# Patient Record
Sex: Female | Born: 1950
Health system: Southern US, Community
[De-identification: ages and names within clinical notes are randomized; demographics above are authoritative.]

## PROBLEM LIST (undated history)

## (undated) ENCOUNTER — Emergency Department (HOSPITAL_COMMUNITY): Payer: Medicare HMO

## (undated) DIAGNOSIS — R002 Palpitations: Secondary | ICD-10-CM

## (undated) DIAGNOSIS — Z860100 Personal history of colon polyps, unspecified: Secondary | ICD-10-CM

## (undated) DIAGNOSIS — R011 Cardiac murmur, unspecified: Secondary | ICD-10-CM

## (undated) DIAGNOSIS — I422 Other hypertrophic cardiomyopathy: Secondary | ICD-10-CM

## (undated) DIAGNOSIS — K219 Gastro-esophageal reflux disease without esophagitis: Secondary | ICD-10-CM

## (undated) DIAGNOSIS — M1990 Unspecified osteoarthritis, unspecified site: Secondary | ICD-10-CM

## (undated) DIAGNOSIS — J309 Allergic rhinitis, unspecified: Secondary | ICD-10-CM

## (undated) DIAGNOSIS — F32A Depression, unspecified: Secondary | ICD-10-CM

## (undated) DIAGNOSIS — H269 Unspecified cataract: Secondary | ICD-10-CM

## (undated) DIAGNOSIS — G4730 Sleep apnea, unspecified: Secondary | ICD-10-CM

## (undated) DIAGNOSIS — G43109 Migraine with aura, not intractable, without status migrainosus: Secondary | ICD-10-CM

## (undated) DIAGNOSIS — I10 Essential (primary) hypertension: Secondary | ICD-10-CM

## (undated) DIAGNOSIS — Z83719 Family history of colon polyps, unspecified: Secondary | ICD-10-CM

## (undated) DIAGNOSIS — M797 Fibromyalgia: Secondary | ICD-10-CM

## (undated) DIAGNOSIS — F419 Anxiety disorder, unspecified: Secondary | ICD-10-CM

## (undated) DIAGNOSIS — E7800 Pure hypercholesterolemia, unspecified: Secondary | ICD-10-CM

## (undated) DIAGNOSIS — Z8371 Family history of colonic polyps: Secondary | ICD-10-CM

## (undated) DIAGNOSIS — I341 Nonrheumatic mitral (valve) prolapse: Secondary | ICD-10-CM

## (undated) HISTORY — DX: Unspecified cataract: H26.9

## (undated) HISTORY — DX: Pure hypercholesterolemia, unspecified: E78.00

## (undated) HISTORY — DX: Other hypertrophic cardiomyopathy: I42.2

## (undated) HISTORY — DX: Unspecified osteoarthritis, unspecified site: M19.90

## (undated) HISTORY — DX: Allergic rhinitis, unspecified: J30.9

## (undated) HISTORY — DX: Palpitations: R00.2

## (undated) HISTORY — PX: ABDOMINAL HYSTERECTOMY: SHX81

## (undated) HISTORY — DX: Family history of colonic polyps: Z83.71

## (undated) HISTORY — DX: Personal history of colon polyps, unspecified: Z86.0100

## (undated) HISTORY — DX: Personal history of colonic polyps: Z86.010

## (undated) HISTORY — DX: Depression, unspecified: F32.A

## (undated) HISTORY — DX: Sleep apnea, unspecified: G47.30

## (undated) HISTORY — DX: Migraine with aura, not intractable, without status migrainosus: G43.109

## (undated) HISTORY — DX: Gastro-esophageal reflux disease without esophagitis: K21.9

## (undated) HISTORY — DX: Family history of colon polyps, unspecified: Z83.719

## (undated) HISTORY — DX: Fibromyalgia: M79.7

## (undated) HISTORY — DX: Nonrheumatic mitral (valve) prolapse: I34.1

## (undated) HISTORY — DX: Anxiety disorder, unspecified: F41.9

## (undated) HISTORY — PX: EYE SURGERY: SHX253

---

## 1979-11-18 HISTORY — PX: OTHER SURGICAL HISTORY: SHX169

## 2010-02-05 ENCOUNTER — Encounter: Payer: Self-pay | Admitting: Cardiology

## 2010-02-21 ENCOUNTER — Encounter: Payer: Self-pay | Admitting: Cardiology

## 2010-02-21 ENCOUNTER — Ambulatory Visit: Payer: Self-pay

## 2010-02-21 ENCOUNTER — Ambulatory Visit: Payer: Self-pay | Admitting: Cardiology

## 2010-02-21 ENCOUNTER — Ambulatory Visit (HOSPITAL_COMMUNITY): Admission: RE | Admit: 2010-02-21 | Discharge: 2010-02-21 | Payer: Self-pay | Admitting: Family Medicine

## 2010-03-11 ENCOUNTER — Telehealth (INDEPENDENT_AMBULATORY_CARE_PROVIDER_SITE_OTHER): Payer: Self-pay | Admitting: *Deleted

## 2010-03-21 ENCOUNTER — Ambulatory Visit: Payer: Self-pay | Admitting: Cardiovascular Disease

## 2010-12-17 NOTE — Letter (Signed)
Summary: [facility] History and Physical   [facility] History and Physical   Imported By: Roderic Ovens 09/18/2010 16:03:10  _____________________________________________________________________  External Attachment:    Type:   Image     Comment:   External Document

## 2010-12-17 NOTE — Progress Notes (Signed)
  Stress Echo faxed over to Leslie Griffin/[facility] [phone] [facility]  March 11, 2010 10:15 AM

## 2011-03-01 ENCOUNTER — Encounter: Payer: Self-pay | Admitting: Cardiovascular Disease

## 2011-04-01 NOTE — Assessment & Plan Note (Signed)
[facility]                        [place] CARDI[facility] OFFICE NOTE   CLAIR, BARDWELL                         MRN:          [id number]  DATE:03/21/2010                            DOB:          10/13/1951    CHIEF COMPLAINT:  Palpitations.   HISTORY OF PRESENT ILLNESS:  Ms. Inga is a 60 year old white female  with past medical history significant for fibromyalgia, anxiety, long-  term history of palpitations, who is presenting for evaluation of the  palpitations.  The patient states that she is experiencing palpitations  for approximately 7 years on and off.  Up until several months ago, she  was experiencing very long episodes of palpitations during the day at  work.  It has since resolved.  Now, she states she feels an extra  heartbeat several times a day.  There are no associated symptoms.  She  also has intermittent episodes of chest discomfort that are infrequent  and self-limiting.  The patient initially wore a 24-hour Holter monitor  in January of this year that showed no significant arrhythmias.  She  then wore a cardiac event monitor for approximately 3-4 weeks, which  again showed no significant arrhythmia.  There were occasional episodes  of sinus tachycardia that did not correlate with any of the symptoms  that she reported.  Lastly, she recently underwent a stress  echocardiogram that was negative for inducible ischemia.  The patient  works full-time as a Theatre stage manager at [employer] and is able to complete her  job without any difficulties.  She has stopped exercising for fear that  she may have a heart condition that would make exercising dangerous.   PAST MEDICAL HISTORY:  As above in the HPI.   SOCIAL HISTORY:  No tobacco.  Rare alcohol.   FAMILY HISTORY:  Positive for premature coronary artery disease.   ALLERGIES:  PENICILLIN, SULFA, BENADRYL, KEFLEX.   MEDICATIONS:  1. Aspirin 81 mg daily.  2. Multivitamin.   REVIEW OF SYSTEMS:   Positive for anxiety, positive for some psychosocial  stressors as her husband is on disability for retinitis pigmentosa.  Other systems as in HPI, otherwise negative.   PHYSICAL EXAMINATION:  VITAL SIGNS:  Blood pressure is 136/80, pulse is  67, saturating 98% on room air.  She weighs 174 pounds.  GENERAL:  No acute distress.  HEENT:  Normocephalic, atraumatic.  NECK:  Supple.  There is no JVD.  There is no carotid bruits.  HEART:  Regular rate and rhythm without murmur, rub, or gallop.  LUNGS:  Clear bilaterally.  ABDOMEN:  Soft, nontender, nondistended.  EXTREMITIES:  Without edema.  SKIN:  Warm and dry.  MUSCULOSKELETAL:  5/5 bilateral upper and lower extremity strength.   Review of the patient's cardiac event monitor reports as well as the  stress echo above in HPI.  The patient has also had a transthoracic  echocardiogram that showed normal ejection fraction and mild mitral  regurgitation.  EKG taken today in clinic independently interpreted by  myself demonstrates normal sinus rhythm without any significant ST or T-  wave abnormalities.   ASSESSMENT:  A 60 year old female presenting with palpitations that are  benign in nature.  The patient has worn a prolonged cardiac event  monitor that was unrevealing.  Her symptoms since that time have  improved.  She has also had a recent negative stress echo.  I think it  is likely that many of the patient's symptoms are related to anxiety.   PLAN:  At this point, I recommend that the patient resume regular  exercise program which she is very interested in doing as this helps her  to relieve some stress.  She is encouraged to follow a well-balanced  diet.  She is reminded that she is at risk for developing significant  coronary disease in the future as she does have a family history of  premature coronary disease.  We will see her back in clinic in 6 months'  time and she will contact our office sooner if any problems arise.      Brayton El, MD  Electronically Signed    SGA/MedQ  DD: 03/21/2010  DT: 03/21/2010  Job #: [id number]

## 2011-04-01 NOTE — Letter (Signed)
Mar 21, 2010    Charlott Rakes, MD.  [address]  [place], [place] [address]   RE:  Leslie, Griffin  MRN:  [id number]  /  DOB:  05-24-1951   Dear Dr. Yetta Flock,   I had the pleasure of seeing Leslie Griffin in clinic this morning.  As you  know, she is a 60 year old female, who has been having issues with  palpitations for quite some time.  She states that over the past month  or two the palpitations have significantly improved and that now she is  only feeling a single extra heart beat at a time, several times a day.  I have reviewed her recent medical record and it appears she has had a  negative stress echocardiogram.  She also wore a 24-hour Holter monitor  followed up by a 3-4 week cardiac event monitor.  Both of these monitors  showed no sign of significant arrhythmia.  There was some sinus  tachycardia on the 30 day event monitor, but it did not correlate with  any of the symptoms the patient experienced.  She does endorse  significant anxiety and stress.   I have encouraged the patient to resume her regular exercise program,  which she is excited to do.  She is reminded that she is at risk for  developing significant coronary disease in the future as she does have a  family history of premature coronary disease.  Therefore, her blood  pressure and her cholesterol should be followed closely.  I see no need  for any further workup at this time and the patient is very happy with  this plan.  Thank you for the referral of this patient and please  contact my office in the future if I can be of further assistance.    Sincerely,      Brayton El, MD  Electronically Signed    SGA/MedQ  DD: 03/21/2010  DT: 03/21/2010  Job #: [id number]

## 2011-06-04 ENCOUNTER — Encounter: Payer: Self-pay | Admitting: Cardiovascular Disease

## 2016-11-18 DIAGNOSIS — G43109 Migraine with aura, not intractable, without status migrainosus: Secondary | ICD-10-CM | POA: Diagnosis not present

## 2016-11-18 DIAGNOSIS — Z6832 Body mass index (BMI) 32.0-32.9, adult: Secondary | ICD-10-CM | POA: Diagnosis not present

## 2016-12-02 DIAGNOSIS — E538 Deficiency of other specified B group vitamins: Secondary | ICD-10-CM | POA: Diagnosis not present

## 2016-12-11 DIAGNOSIS — M797 Fibromyalgia: Secondary | ICD-10-CM | POA: Diagnosis not present

## 2016-12-11 DIAGNOSIS — E538 Deficiency of other specified B group vitamins: Secondary | ICD-10-CM | POA: Diagnosis not present

## 2016-12-19 DIAGNOSIS — E538 Deficiency of other specified B group vitamins: Secondary | ICD-10-CM | POA: Diagnosis not present

## 2016-12-30 DIAGNOSIS — E538 Deficiency of other specified B group vitamins: Secondary | ICD-10-CM | POA: Diagnosis not present

## 2017-01-02 DIAGNOSIS — R0789 Other chest pain: Secondary | ICD-10-CM | POA: Diagnosis not present

## 2017-01-06 DIAGNOSIS — E538 Deficiency of other specified B group vitamins: Secondary | ICD-10-CM | POA: Diagnosis not present

## 2017-01-15 DIAGNOSIS — G4700 Insomnia, unspecified: Secondary | ICD-10-CM | POA: Diagnosis not present

## 2017-01-15 DIAGNOSIS — E538 Deficiency of other specified B group vitamins: Secondary | ICD-10-CM | POA: Diagnosis not present

## 2017-01-15 DIAGNOSIS — I10 Essential (primary) hypertension: Secondary | ICD-10-CM | POA: Diagnosis not present

## 2017-01-15 DIAGNOSIS — R0789 Other chest pain: Secondary | ICD-10-CM | POA: Diagnosis not present

## 2017-01-22 DIAGNOSIS — Z6832 Body mass index (BMI) 32.0-32.9, adult: Secondary | ICD-10-CM | POA: Diagnosis not present

## 2017-01-22 DIAGNOSIS — E538 Deficiency of other specified B group vitamins: Secondary | ICD-10-CM | POA: Diagnosis not present

## 2017-01-22 DIAGNOSIS — I10 Essential (primary) hypertension: Secondary | ICD-10-CM | POA: Diagnosis not present

## 2017-01-22 DIAGNOSIS — E782 Mixed hyperlipidemia: Secondary | ICD-10-CM | POA: Diagnosis not present

## 2017-02-05 DIAGNOSIS — E538 Deficiency of other specified B group vitamins: Secondary | ICD-10-CM | POA: Diagnosis not present

## 2017-03-04 DIAGNOSIS — H43813 Vitreous degeneration, bilateral: Secondary | ICD-10-CM | POA: Diagnosis not present

## 2017-03-10 DIAGNOSIS — E538 Deficiency of other specified B group vitamins: Secondary | ICD-10-CM | POA: Diagnosis not present

## 2017-03-12 DIAGNOSIS — H4423 Degenerative myopia, bilateral: Secondary | ICD-10-CM | POA: Diagnosis not present

## 2017-04-16 DIAGNOSIS — H4423 Degenerative myopia, bilateral: Secondary | ICD-10-CM | POA: Diagnosis not present

## 2017-04-16 DIAGNOSIS — H43813 Vitreous degeneration, bilateral: Secondary | ICD-10-CM | POA: Diagnosis not present

## 2017-04-22 DIAGNOSIS — E782 Mixed hyperlipidemia: Secondary | ICD-10-CM | POA: Diagnosis not present

## 2017-04-22 DIAGNOSIS — I10 Essential (primary) hypertension: Secondary | ICD-10-CM | POA: Diagnosis not present

## 2017-04-30 DIAGNOSIS — F339 Major depressive disorder, recurrent, unspecified: Secondary | ICD-10-CM | POA: Diagnosis not present

## 2017-04-30 DIAGNOSIS — R69 Illness, unspecified: Secondary | ICD-10-CM | POA: Diagnosis not present

## 2017-04-30 DIAGNOSIS — E663 Overweight: Secondary | ICD-10-CM | POA: Diagnosis not present

## 2017-04-30 DIAGNOSIS — M797 Fibromyalgia: Secondary | ICD-10-CM | POA: Diagnosis not present

## 2017-04-30 DIAGNOSIS — I249 Acute ischemic heart disease, unspecified: Secondary | ICD-10-CM | POA: Diagnosis not present

## 2017-04-30 DIAGNOSIS — E782 Mixed hyperlipidemia: Secondary | ICD-10-CM | POA: Diagnosis not present

## 2017-04-30 DIAGNOSIS — R51 Headache: Secondary | ICD-10-CM | POA: Diagnosis not present

## 2017-04-30 DIAGNOSIS — G43909 Migraine, unspecified, not intractable, without status migrainosus: Secondary | ICD-10-CM | POA: Diagnosis not present

## 2017-04-30 DIAGNOSIS — Z1389 Encounter for screening for other disorder: Secondary | ICD-10-CM | POA: Diagnosis not present

## 2017-04-30 DIAGNOSIS — I10 Essential (primary) hypertension: Secondary | ICD-10-CM | POA: Diagnosis not present

## 2017-04-30 DIAGNOSIS — Z139 Encounter for screening, unspecified: Secondary | ICD-10-CM | POA: Diagnosis not present

## 2017-04-30 DIAGNOSIS — R0602 Shortness of breath: Secondary | ICD-10-CM | POA: Diagnosis not present

## 2017-05-15 DIAGNOSIS — I081 Rheumatic disorders of both mitral and tricuspid valves: Secondary | ICD-10-CM | POA: Diagnosis not present

## 2017-05-15 DIAGNOSIS — I517 Cardiomegaly: Secondary | ICD-10-CM | POA: Diagnosis not present

## 2017-05-15 DIAGNOSIS — I519 Heart disease, unspecified: Secondary | ICD-10-CM | POA: Diagnosis not present

## 2017-05-15 DIAGNOSIS — I350 Nonrheumatic aortic (valve) stenosis: Secondary | ICD-10-CM | POA: Diagnosis not present

## 2017-05-18 DIAGNOSIS — H26491 Other secondary cataract, right eye: Secondary | ICD-10-CM | POA: Diagnosis not present

## 2017-06-02 DIAGNOSIS — H26491 Other secondary cataract, right eye: Secondary | ICD-10-CM | POA: Diagnosis not present

## 2017-06-08 DIAGNOSIS — R69 Illness, unspecified: Secondary | ICD-10-CM | POA: Diagnosis not present

## 2017-06-08 DIAGNOSIS — I519 Heart disease, unspecified: Secondary | ICD-10-CM | POA: Diagnosis not present

## 2017-06-18 DIAGNOSIS — H43813 Vitreous degeneration, bilateral: Secondary | ICD-10-CM | POA: Diagnosis not present

## 2017-06-25 DIAGNOSIS — R079 Chest pain, unspecified: Secondary | ICD-10-CM | POA: Diagnosis not present

## 2017-06-25 DIAGNOSIS — Z8249 Family history of ischemic heart disease and other diseases of the circulatory system: Secondary | ICD-10-CM | POA: Diagnosis not present

## 2017-06-25 DIAGNOSIS — R011 Cardiac murmur, unspecified: Secondary | ICD-10-CM | POA: Diagnosis not present

## 2017-06-25 DIAGNOSIS — Z6834 Body mass index (BMI) 34.0-34.9, adult: Secondary | ICD-10-CM | POA: Diagnosis not present

## 2017-07-06 DIAGNOSIS — E782 Mixed hyperlipidemia: Secondary | ICD-10-CM | POA: Diagnosis not present

## 2017-07-06 DIAGNOSIS — I10 Essential (primary) hypertension: Secondary | ICD-10-CM | POA: Diagnosis not present

## 2017-07-06 DIAGNOSIS — E538 Deficiency of other specified B group vitamins: Secondary | ICD-10-CM | POA: Diagnosis not present

## 2017-07-07 DIAGNOSIS — Z1211 Encounter for screening for malignant neoplasm of colon: Secondary | ICD-10-CM | POA: Diagnosis not present

## 2017-07-07 DIAGNOSIS — Z8371 Family history of colonic polyps: Secondary | ICD-10-CM | POA: Diagnosis not present

## 2017-07-10 DIAGNOSIS — E782 Mixed hyperlipidemia: Secondary | ICD-10-CM | POA: Diagnosis not present

## 2017-07-10 DIAGNOSIS — D649 Anemia, unspecified: Secondary | ICD-10-CM | POA: Diagnosis not present

## 2017-07-10 DIAGNOSIS — Z6834 Body mass index (BMI) 34.0-34.9, adult: Secondary | ICD-10-CM | POA: Diagnosis not present

## 2017-07-10 DIAGNOSIS — E538 Deficiency of other specified B group vitamins: Secondary | ICD-10-CM | POA: Diagnosis not present

## 2017-07-10 DIAGNOSIS — I10 Essential (primary) hypertension: Secondary | ICD-10-CM | POA: Diagnosis not present

## 2017-07-10 DIAGNOSIS — R61 Generalized hyperhidrosis: Secondary | ICD-10-CM | POA: Diagnosis not present

## 2017-07-15 DIAGNOSIS — I208 Other forms of angina pectoris: Secondary | ICD-10-CM | POA: Diagnosis not present

## 2017-07-15 DIAGNOSIS — R5383 Other fatigue: Secondary | ICD-10-CM | POA: Diagnosis not present

## 2017-07-15 DIAGNOSIS — R600 Localized edema: Secondary | ICD-10-CM | POA: Diagnosis not present

## 2017-07-15 DIAGNOSIS — R0609 Other forms of dyspnea: Secondary | ICD-10-CM | POA: Diagnosis not present

## 2017-07-16 DIAGNOSIS — R400 Somnolence: Secondary | ICD-10-CM | POA: Diagnosis not present

## 2017-07-16 DIAGNOSIS — R5383 Other fatigue: Secondary | ICD-10-CM | POA: Diagnosis not present

## 2017-07-16 DIAGNOSIS — R0902 Hypoxemia: Secondary | ICD-10-CM | POA: Diagnosis not present

## 2017-07-16 DIAGNOSIS — G4733 Obstructive sleep apnea (adult) (pediatric): Secondary | ICD-10-CM | POA: Diagnosis not present

## 2017-07-17 DIAGNOSIS — I2510 Atherosclerotic heart disease of native coronary artery without angina pectoris: Secondary | ICD-10-CM | POA: Diagnosis not present

## 2017-07-17 DIAGNOSIS — R9431 Abnormal electrocardiogram [ECG] [EKG]: Secondary | ICD-10-CM | POA: Diagnosis not present

## 2017-07-17 DIAGNOSIS — I208 Other forms of angina pectoris: Secondary | ICD-10-CM | POA: Diagnosis not present

## 2017-07-17 DIAGNOSIS — R5383 Other fatigue: Secondary | ICD-10-CM | POA: Diagnosis not present

## 2017-07-17 DIAGNOSIS — I10 Essential (primary) hypertension: Secondary | ICD-10-CM | POA: Diagnosis not present

## 2017-07-17 DIAGNOSIS — I429 Cardiomyopathy, unspecified: Secondary | ICD-10-CM | POA: Diagnosis not present

## 2017-07-17 DIAGNOSIS — Z8249 Family history of ischemic heart disease and other diseases of the circulatory system: Secondary | ICD-10-CM | POA: Diagnosis not present

## 2017-07-17 DIAGNOSIS — E785 Hyperlipidemia, unspecified: Secondary | ICD-10-CM | POA: Diagnosis not present

## 2017-07-17 DIAGNOSIS — R0609 Other forms of dyspnea: Secondary | ICD-10-CM | POA: Diagnosis not present

## 2017-07-17 DIAGNOSIS — R600 Localized edema: Secondary | ICD-10-CM | POA: Diagnosis not present

## 2017-07-29 DIAGNOSIS — R5383 Other fatigue: Secondary | ICD-10-CM | POA: Diagnosis not present

## 2017-07-29 DIAGNOSIS — R0609 Other forms of dyspnea: Secondary | ICD-10-CM | POA: Diagnosis not present

## 2017-07-29 DIAGNOSIS — R600 Localized edema: Secondary | ICD-10-CM | POA: Diagnosis not present

## 2017-07-29 DIAGNOSIS — I208 Other forms of angina pectoris: Secondary | ICD-10-CM | POA: Diagnosis not present

## 2017-08-03 DIAGNOSIS — R931 Abnormal findings on diagnostic imaging of heart and coronary circulation: Secondary | ICD-10-CM | POA: Diagnosis not present

## 2017-08-03 DIAGNOSIS — R600 Localized edema: Secondary | ICD-10-CM | POA: Diagnosis not present

## 2017-08-03 DIAGNOSIS — R5383 Other fatigue: Secondary | ICD-10-CM | POA: Diagnosis not present

## 2017-08-03 DIAGNOSIS — E785 Hyperlipidemia, unspecified: Secondary | ICD-10-CM | POA: Diagnosis not present

## 2017-08-03 DIAGNOSIS — I2510 Atherosclerotic heart disease of native coronary artery without angina pectoris: Secondary | ICD-10-CM | POA: Diagnosis not present

## 2017-08-03 DIAGNOSIS — I10 Essential (primary) hypertension: Secondary | ICD-10-CM | POA: Diagnosis not present

## 2017-08-03 DIAGNOSIS — R0609 Other forms of dyspnea: Secondary | ICD-10-CM | POA: Diagnosis not present

## 2017-08-03 DIAGNOSIS — R9431 Abnormal electrocardiogram [ECG] [EKG]: Secondary | ICD-10-CM | POA: Diagnosis not present

## 2017-08-03 DIAGNOSIS — I208 Other forms of angina pectoris: Secondary | ICD-10-CM | POA: Diagnosis not present

## 2017-08-03 DIAGNOSIS — I519 Heart disease, unspecified: Secondary | ICD-10-CM | POA: Diagnosis not present

## 2017-08-04 DIAGNOSIS — R0609 Other forms of dyspnea: Secondary | ICD-10-CM | POA: Diagnosis not present

## 2017-08-04 DIAGNOSIS — E785 Hyperlipidemia, unspecified: Secondary | ICD-10-CM | POA: Diagnosis not present

## 2017-08-04 DIAGNOSIS — I208 Other forms of angina pectoris: Secondary | ICD-10-CM | POA: Diagnosis not present

## 2017-08-04 DIAGNOSIS — R9431 Abnormal electrocardiogram [ECG] [EKG]: Secondary | ICD-10-CM | POA: Diagnosis not present

## 2017-08-04 DIAGNOSIS — I519 Heart disease, unspecified: Secondary | ICD-10-CM | POA: Diagnosis not present

## 2017-08-04 DIAGNOSIS — R600 Localized edema: Secondary | ICD-10-CM | POA: Diagnosis not present

## 2017-08-04 DIAGNOSIS — I2510 Atherosclerotic heart disease of native coronary artery without angina pectoris: Secondary | ICD-10-CM | POA: Diagnosis not present

## 2017-08-04 DIAGNOSIS — I10 Essential (primary) hypertension: Secondary | ICD-10-CM | POA: Diagnosis not present

## 2017-08-04 DIAGNOSIS — R5383 Other fatigue: Secondary | ICD-10-CM | POA: Diagnosis not present

## 2017-08-04 DIAGNOSIS — R931 Abnormal findings on diagnostic imaging of heart and coronary circulation: Secondary | ICD-10-CM | POA: Diagnosis not present

## 2017-08-05 DIAGNOSIS — R9431 Abnormal electrocardiogram [ECG] [EKG]: Secondary | ICD-10-CM | POA: Diagnosis not present

## 2017-08-05 DIAGNOSIS — R931 Abnormal findings on diagnostic imaging of heart and coronary circulation: Secondary | ICD-10-CM | POA: Diagnosis not present

## 2017-08-05 DIAGNOSIS — I10 Essential (primary) hypertension: Secondary | ICD-10-CM | POA: Diagnosis not present

## 2017-08-05 DIAGNOSIS — R0609 Other forms of dyspnea: Secondary | ICD-10-CM | POA: Diagnosis not present

## 2017-08-05 DIAGNOSIS — E785 Hyperlipidemia, unspecified: Secondary | ICD-10-CM | POA: Diagnosis not present

## 2017-08-05 DIAGNOSIS — I519 Heart disease, unspecified: Secondary | ICD-10-CM | POA: Diagnosis not present

## 2017-08-05 DIAGNOSIS — I208 Other forms of angina pectoris: Secondary | ICD-10-CM | POA: Diagnosis not present

## 2017-08-05 DIAGNOSIS — Z8249 Family history of ischemic heart disease and other diseases of the circulatory system: Secondary | ICD-10-CM | POA: Diagnosis not present

## 2017-08-05 DIAGNOSIS — R5383 Other fatigue: Secondary | ICD-10-CM | POA: Diagnosis not present

## 2017-08-09 DIAGNOSIS — S80862A Insect bite (nonvenomous), left lower leg, initial encounter: Secondary | ICD-10-CM | POA: Diagnosis not present

## 2017-08-09 DIAGNOSIS — A932 Colorado tick fever: Secondary | ICD-10-CM | POA: Diagnosis not present

## 2017-08-10 DIAGNOSIS — R69 Illness, unspecified: Secondary | ICD-10-CM | POA: Diagnosis not present

## 2017-08-12 DIAGNOSIS — S30860A Insect bite (nonvenomous) of lower back and pelvis, initial encounter: Secondary | ICD-10-CM | POA: Diagnosis not present

## 2017-08-28 DIAGNOSIS — R5383 Other fatigue: Secondary | ICD-10-CM | POA: Diagnosis not present

## 2017-08-28 DIAGNOSIS — Z23 Encounter for immunization: Secondary | ICD-10-CM | POA: Diagnosis not present

## 2017-08-28 DIAGNOSIS — Z139 Encounter for screening, unspecified: Secondary | ICD-10-CM | POA: Diagnosis not present

## 2017-08-28 DIAGNOSIS — G4733 Obstructive sleep apnea (adult) (pediatric): Secondary | ICD-10-CM | POA: Diagnosis not present

## 2017-10-05 DIAGNOSIS — Z1211 Encounter for screening for malignant neoplasm of colon: Secondary | ICD-10-CM | POA: Diagnosis not present

## 2017-10-05 DIAGNOSIS — G43909 Migraine, unspecified, not intractable, without status migrainosus: Secondary | ICD-10-CM | POA: Diagnosis not present

## 2017-10-05 DIAGNOSIS — M797 Fibromyalgia: Secondary | ICD-10-CM | POA: Diagnosis not present

## 2017-10-05 DIAGNOSIS — K5730 Diverticulosis of large intestine without perforation or abscess without bleeding: Secondary | ICD-10-CM | POA: Diagnosis not present

## 2017-10-05 DIAGNOSIS — E7800 Pure hypercholesterolemia, unspecified: Secondary | ICD-10-CM | POA: Diagnosis not present

## 2017-10-05 DIAGNOSIS — M1990 Unspecified osteoarthritis, unspecified site: Secondary | ICD-10-CM | POA: Diagnosis not present

## 2017-10-05 DIAGNOSIS — Z79899 Other long term (current) drug therapy: Secondary | ICD-10-CM | POA: Diagnosis not present

## 2017-10-05 DIAGNOSIS — K648 Other hemorrhoids: Secondary | ICD-10-CM | POA: Diagnosis not present

## 2017-10-05 DIAGNOSIS — Z8371 Family history of colonic polyps: Secondary | ICD-10-CM | POA: Diagnosis not present

## 2017-10-05 HISTORY — PX: COLONOSCOPY: SHX174

## 2017-10-21 DIAGNOSIS — Z0100 Encounter for examination of eyes and vision without abnormal findings: Secondary | ICD-10-CM | POA: Diagnosis not present

## 2017-10-21 DIAGNOSIS — H5210 Myopia, unspecified eye: Secondary | ICD-10-CM | POA: Diagnosis not present

## 2017-10-21 DIAGNOSIS — H18413 Arcus senilis, bilateral: Secondary | ICD-10-CM | POA: Diagnosis not present

## 2017-10-29 DIAGNOSIS — R69 Illness, unspecified: Secondary | ICD-10-CM | POA: Diagnosis not present

## 2017-11-04 DIAGNOSIS — R69 Illness, unspecified: Secondary | ICD-10-CM | POA: Diagnosis not present

## 2017-11-04 DIAGNOSIS — G4700 Insomnia, unspecified: Secondary | ICD-10-CM | POA: Insufficient documentation

## 2017-11-04 DIAGNOSIS — M797 Fibromyalgia: Secondary | ICD-10-CM | POA: Diagnosis not present

## 2017-11-04 DIAGNOSIS — M2550 Pain in unspecified joint: Secondary | ICD-10-CM | POA: Insufficient documentation

## 2017-11-04 DIAGNOSIS — Z9151 Personal history of suicidal behavior: Secondary | ICD-10-CM | POA: Insufficient documentation

## 2017-11-04 DIAGNOSIS — F331 Major depressive disorder, recurrent, moderate: Secondary | ICD-10-CM | POA: Insufficient documentation

## 2017-11-04 DIAGNOSIS — F419 Anxiety disorder, unspecified: Secondary | ICD-10-CM | POA: Insufficient documentation

## 2017-11-19 DIAGNOSIS — R69 Illness, unspecified: Secondary | ICD-10-CM | POA: Diagnosis not present

## 2017-12-03 DIAGNOSIS — M797 Fibromyalgia: Secondary | ICD-10-CM | POA: Diagnosis not present

## 2017-12-03 DIAGNOSIS — R5383 Other fatigue: Secondary | ICD-10-CM | POA: Diagnosis not present

## 2017-12-03 DIAGNOSIS — M545 Low back pain: Secondary | ICD-10-CM | POA: Diagnosis not present

## 2017-12-04 DIAGNOSIS — R5383 Other fatigue: Secondary | ICD-10-CM | POA: Insufficient documentation

## 2017-12-04 DIAGNOSIS — M545 Low back pain, unspecified: Secondary | ICD-10-CM | POA: Insufficient documentation

## 2018-01-27 DIAGNOSIS — M25571 Pain in right ankle and joints of right foot: Secondary | ICD-10-CM | POA: Diagnosis not present

## 2018-01-27 DIAGNOSIS — M25471 Effusion, right ankle: Secondary | ICD-10-CM | POA: Insufficient documentation

## 2018-02-03 DIAGNOSIS — R5383 Other fatigue: Secondary | ICD-10-CM | POA: Diagnosis not present

## 2018-02-03 DIAGNOSIS — I429 Cardiomyopathy, unspecified: Secondary | ICD-10-CM | POA: Diagnosis not present

## 2018-02-03 DIAGNOSIS — M797 Fibromyalgia: Secondary | ICD-10-CM | POA: Diagnosis not present

## 2018-02-03 DIAGNOSIS — R0609 Other forms of dyspnea: Secondary | ICD-10-CM | POA: Diagnosis not present

## 2018-02-03 DIAGNOSIS — I519 Heart disease, unspecified: Secondary | ICD-10-CM | POA: Diagnosis not present

## 2018-02-03 DIAGNOSIS — R931 Abnormal findings on diagnostic imaging of heart and coronary circulation: Secondary | ICD-10-CM | POA: Diagnosis not present

## 2018-02-03 DIAGNOSIS — I10 Essential (primary) hypertension: Secondary | ICD-10-CM | POA: Diagnosis not present

## 2018-02-03 DIAGNOSIS — E785 Hyperlipidemia, unspecified: Secondary | ICD-10-CM | POA: Diagnosis not present

## 2018-02-03 DIAGNOSIS — I208 Other forms of angina pectoris: Secondary | ICD-10-CM | POA: Diagnosis not present

## 2018-02-03 DIAGNOSIS — R9431 Abnormal electrocardiogram [ECG] [EKG]: Secondary | ICD-10-CM | POA: Diagnosis not present

## 2018-03-22 DIAGNOSIS — M797 Fibromyalgia: Secondary | ICD-10-CM | POA: Diagnosis not present

## 2018-04-21 DIAGNOSIS — I10 Essential (primary) hypertension: Secondary | ICD-10-CM | POA: Diagnosis not present

## 2018-04-21 DIAGNOSIS — R931 Abnormal findings on diagnostic imaging of heart and coronary circulation: Secondary | ICD-10-CM | POA: Diagnosis not present

## 2018-04-21 DIAGNOSIS — R5383 Other fatigue: Secondary | ICD-10-CM | POA: Diagnosis not present

## 2018-04-21 DIAGNOSIS — I208 Other forms of angina pectoris: Secondary | ICD-10-CM | POA: Diagnosis not present

## 2018-04-21 DIAGNOSIS — R0609 Other forms of dyspnea: Secondary | ICD-10-CM | POA: Diagnosis not present

## 2018-04-21 DIAGNOSIS — E785 Hyperlipidemia, unspecified: Secondary | ICD-10-CM | POA: Diagnosis not present

## 2018-04-21 DIAGNOSIS — R9431 Abnormal electrocardiogram [ECG] [EKG]: Secondary | ICD-10-CM | POA: Diagnosis not present

## 2018-04-21 DIAGNOSIS — I519 Heart disease, unspecified: Secondary | ICD-10-CM | POA: Diagnosis not present

## 2018-05-05 DIAGNOSIS — I519 Heart disease, unspecified: Secondary | ICD-10-CM | POA: Diagnosis not present

## 2018-05-05 DIAGNOSIS — I43 Cardiomyopathy in diseases classified elsewhere: Secondary | ICD-10-CM | POA: Diagnosis not present

## 2018-05-05 DIAGNOSIS — E785 Hyperlipidemia, unspecified: Secondary | ICD-10-CM | POA: Diagnosis not present

## 2018-05-05 DIAGNOSIS — I119 Hypertensive heart disease without heart failure: Secondary | ICD-10-CM | POA: Diagnosis not present

## 2018-05-05 DIAGNOSIS — I10 Essential (primary) hypertension: Secondary | ICD-10-CM | POA: Diagnosis not present

## 2018-05-22 DIAGNOSIS — S39012A Strain of muscle, fascia and tendon of lower back, initial encounter: Secondary | ICD-10-CM | POA: Diagnosis not present

## 2018-05-25 DIAGNOSIS — R69 Illness, unspecified: Secondary | ICD-10-CM | POA: Diagnosis not present

## 2018-06-03 DIAGNOSIS — M4316 Spondylolisthesis, lumbar region: Secondary | ICD-10-CM | POA: Diagnosis not present

## 2018-06-03 DIAGNOSIS — M9903 Segmental and somatic dysfunction of lumbar region: Secondary | ICD-10-CM | POA: Diagnosis not present

## 2018-06-03 DIAGNOSIS — M545 Low back pain: Secondary | ICD-10-CM | POA: Diagnosis not present

## 2018-06-03 DIAGNOSIS — S29019A Strain of muscle and tendon of unspecified wall of thorax, initial encounter: Secondary | ICD-10-CM | POA: Diagnosis not present

## 2018-06-03 DIAGNOSIS — S39012A Strain of muscle, fascia and tendon of lower back, initial encounter: Secondary | ICD-10-CM | POA: Diagnosis not present

## 2018-06-03 DIAGNOSIS — M542 Cervicalgia: Secondary | ICD-10-CM | POA: Diagnosis not present

## 2018-06-03 DIAGNOSIS — S161XXA Strain of muscle, fascia and tendon at neck level, initial encounter: Secondary | ICD-10-CM | POA: Diagnosis not present

## 2018-06-03 DIAGNOSIS — M5032 Other cervical disc degeneration, mid-cervical region, unspecified level: Secondary | ICD-10-CM | POA: Diagnosis not present

## 2018-06-03 DIAGNOSIS — M9902 Segmental and somatic dysfunction of thoracic region: Secondary | ICD-10-CM | POA: Diagnosis not present

## 2018-06-03 DIAGNOSIS — M9901 Segmental and somatic dysfunction of cervical region: Secondary | ICD-10-CM | POA: Diagnosis not present

## 2018-06-07 DIAGNOSIS — M9901 Segmental and somatic dysfunction of cervical region: Secondary | ICD-10-CM | POA: Diagnosis not present

## 2018-06-07 DIAGNOSIS — S39012A Strain of muscle, fascia and tendon of lower back, initial encounter: Secondary | ICD-10-CM | POA: Diagnosis not present

## 2018-06-07 DIAGNOSIS — M9902 Segmental and somatic dysfunction of thoracic region: Secondary | ICD-10-CM | POA: Diagnosis not present

## 2018-06-07 DIAGNOSIS — M9903 Segmental and somatic dysfunction of lumbar region: Secondary | ICD-10-CM | POA: Diagnosis not present

## 2018-06-07 DIAGNOSIS — S29019A Strain of muscle and tendon of unspecified wall of thorax, initial encounter: Secondary | ICD-10-CM | POA: Diagnosis not present

## 2018-06-07 DIAGNOSIS — M5032 Other cervical disc degeneration, mid-cervical region, unspecified level: Secondary | ICD-10-CM | POA: Diagnosis not present

## 2018-06-07 DIAGNOSIS — M545 Low back pain: Secondary | ICD-10-CM | POA: Diagnosis not present

## 2018-06-07 DIAGNOSIS — S161XXA Strain of muscle, fascia and tendon at neck level, initial encounter: Secondary | ICD-10-CM | POA: Diagnosis not present

## 2018-06-07 DIAGNOSIS — M4316 Spondylolisthesis, lumbar region: Secondary | ICD-10-CM | POA: Diagnosis not present

## 2018-06-07 DIAGNOSIS — M542 Cervicalgia: Secondary | ICD-10-CM | POA: Diagnosis not present

## 2018-06-09 DIAGNOSIS — M4316 Spondylolisthesis, lumbar region: Secondary | ICD-10-CM | POA: Diagnosis not present

## 2018-06-09 DIAGNOSIS — M545 Low back pain: Secondary | ICD-10-CM | POA: Diagnosis not present

## 2018-06-09 DIAGNOSIS — M9903 Segmental and somatic dysfunction of lumbar region: Secondary | ICD-10-CM | POA: Diagnosis not present

## 2018-06-09 DIAGNOSIS — M542 Cervicalgia: Secondary | ICD-10-CM | POA: Diagnosis not present

## 2018-06-09 DIAGNOSIS — M5032 Other cervical disc degeneration, mid-cervical region, unspecified level: Secondary | ICD-10-CM | POA: Diagnosis not present

## 2018-06-09 DIAGNOSIS — M9901 Segmental and somatic dysfunction of cervical region: Secondary | ICD-10-CM | POA: Diagnosis not present

## 2018-06-09 DIAGNOSIS — M9902 Segmental and somatic dysfunction of thoracic region: Secondary | ICD-10-CM | POA: Diagnosis not present

## 2018-06-09 DIAGNOSIS — S161XXA Strain of muscle, fascia and tendon at neck level, initial encounter: Secondary | ICD-10-CM | POA: Diagnosis not present

## 2018-06-09 DIAGNOSIS — S29019A Strain of muscle and tendon of unspecified wall of thorax, initial encounter: Secondary | ICD-10-CM | POA: Diagnosis not present

## 2018-06-09 DIAGNOSIS — S39012A Strain of muscle, fascia and tendon of lower back, initial encounter: Secondary | ICD-10-CM | POA: Diagnosis not present

## 2018-06-11 DIAGNOSIS — S39012A Strain of muscle, fascia and tendon of lower back, initial encounter: Secondary | ICD-10-CM | POA: Diagnosis not present

## 2018-06-11 DIAGNOSIS — M545 Low back pain: Secondary | ICD-10-CM | POA: Diagnosis not present

## 2018-06-11 DIAGNOSIS — M9901 Segmental and somatic dysfunction of cervical region: Secondary | ICD-10-CM | POA: Diagnosis not present

## 2018-06-11 DIAGNOSIS — M542 Cervicalgia: Secondary | ICD-10-CM | POA: Diagnosis not present

## 2018-06-11 DIAGNOSIS — M5032 Other cervical disc degeneration, mid-cervical region, unspecified level: Secondary | ICD-10-CM | POA: Diagnosis not present

## 2018-06-11 DIAGNOSIS — M9902 Segmental and somatic dysfunction of thoracic region: Secondary | ICD-10-CM | POA: Diagnosis not present

## 2018-06-11 DIAGNOSIS — M9903 Segmental and somatic dysfunction of lumbar region: Secondary | ICD-10-CM | POA: Diagnosis not present

## 2018-06-11 DIAGNOSIS — S29019A Strain of muscle and tendon of unspecified wall of thorax, initial encounter: Secondary | ICD-10-CM | POA: Diagnosis not present

## 2018-06-11 DIAGNOSIS — M4316 Spondylolisthesis, lumbar region: Secondary | ICD-10-CM | POA: Diagnosis not present

## 2018-06-11 DIAGNOSIS — S161XXA Strain of muscle, fascia and tendon at neck level, initial encounter: Secondary | ICD-10-CM | POA: Diagnosis not present

## 2018-06-14 DIAGNOSIS — S161XXA Strain of muscle, fascia and tendon at neck level, initial encounter: Secondary | ICD-10-CM | POA: Diagnosis not present

## 2018-06-14 DIAGNOSIS — M545 Low back pain: Secondary | ICD-10-CM | POA: Diagnosis not present

## 2018-06-14 DIAGNOSIS — M542 Cervicalgia: Secondary | ICD-10-CM | POA: Diagnosis not present

## 2018-06-14 DIAGNOSIS — M9902 Segmental and somatic dysfunction of thoracic region: Secondary | ICD-10-CM | POA: Diagnosis not present

## 2018-06-14 DIAGNOSIS — S39012A Strain of muscle, fascia and tendon of lower back, initial encounter: Secondary | ICD-10-CM | POA: Diagnosis not present

## 2018-06-14 DIAGNOSIS — S29019A Strain of muscle and tendon of unspecified wall of thorax, initial encounter: Secondary | ICD-10-CM | POA: Diagnosis not present

## 2018-06-14 DIAGNOSIS — M4316 Spondylolisthesis, lumbar region: Secondary | ICD-10-CM | POA: Diagnosis not present

## 2018-06-14 DIAGNOSIS — M9903 Segmental and somatic dysfunction of lumbar region: Secondary | ICD-10-CM | POA: Diagnosis not present

## 2018-06-14 DIAGNOSIS — M9901 Segmental and somatic dysfunction of cervical region: Secondary | ICD-10-CM | POA: Diagnosis not present

## 2018-06-14 DIAGNOSIS — M5032 Other cervical disc degeneration, mid-cervical region, unspecified level: Secondary | ICD-10-CM | POA: Diagnosis not present

## 2018-06-16 DIAGNOSIS — M545 Low back pain: Secondary | ICD-10-CM | POA: Diagnosis not present

## 2018-06-16 DIAGNOSIS — S161XXA Strain of muscle, fascia and tendon at neck level, initial encounter: Secondary | ICD-10-CM | POA: Diagnosis not present

## 2018-06-16 DIAGNOSIS — S39012A Strain of muscle, fascia and tendon of lower back, initial encounter: Secondary | ICD-10-CM | POA: Diagnosis not present

## 2018-06-16 DIAGNOSIS — M9901 Segmental and somatic dysfunction of cervical region: Secondary | ICD-10-CM | POA: Diagnosis not present

## 2018-06-16 DIAGNOSIS — M4316 Spondylolisthesis, lumbar region: Secondary | ICD-10-CM | POA: Diagnosis not present

## 2018-06-16 DIAGNOSIS — M5032 Other cervical disc degeneration, mid-cervical region, unspecified level: Secondary | ICD-10-CM | POA: Diagnosis not present

## 2018-06-16 DIAGNOSIS — S29019A Strain of muscle and tendon of unspecified wall of thorax, initial encounter: Secondary | ICD-10-CM | POA: Diagnosis not present

## 2018-06-16 DIAGNOSIS — M542 Cervicalgia: Secondary | ICD-10-CM | POA: Diagnosis not present

## 2018-06-16 DIAGNOSIS — M9902 Segmental and somatic dysfunction of thoracic region: Secondary | ICD-10-CM | POA: Diagnosis not present

## 2018-06-16 DIAGNOSIS — M9903 Segmental and somatic dysfunction of lumbar region: Secondary | ICD-10-CM | POA: Diagnosis not present

## 2018-06-18 DIAGNOSIS — S39012A Strain of muscle, fascia and tendon of lower back, initial encounter: Secondary | ICD-10-CM | POA: Diagnosis not present

## 2018-06-18 DIAGNOSIS — M9903 Segmental and somatic dysfunction of lumbar region: Secondary | ICD-10-CM | POA: Diagnosis not present

## 2018-06-18 DIAGNOSIS — M545 Low back pain: Secondary | ICD-10-CM | POA: Diagnosis not present

## 2018-06-18 DIAGNOSIS — M5032 Other cervical disc degeneration, mid-cervical region, unspecified level: Secondary | ICD-10-CM | POA: Diagnosis not present

## 2018-06-18 DIAGNOSIS — S161XXA Strain of muscle, fascia and tendon at neck level, initial encounter: Secondary | ICD-10-CM | POA: Diagnosis not present

## 2018-06-18 DIAGNOSIS — M4316 Spondylolisthesis, lumbar region: Secondary | ICD-10-CM | POA: Diagnosis not present

## 2018-06-18 DIAGNOSIS — M9901 Segmental and somatic dysfunction of cervical region: Secondary | ICD-10-CM | POA: Diagnosis not present

## 2018-06-18 DIAGNOSIS — M9902 Segmental and somatic dysfunction of thoracic region: Secondary | ICD-10-CM | POA: Diagnosis not present

## 2018-06-18 DIAGNOSIS — S29019A Strain of muscle and tendon of unspecified wall of thorax, initial encounter: Secondary | ICD-10-CM | POA: Diagnosis not present

## 2018-06-18 DIAGNOSIS — M542 Cervicalgia: Secondary | ICD-10-CM | POA: Diagnosis not present

## 2018-06-21 DIAGNOSIS — S39012A Strain of muscle, fascia and tendon of lower back, initial encounter: Secondary | ICD-10-CM | POA: Diagnosis not present

## 2018-06-21 DIAGNOSIS — M9902 Segmental and somatic dysfunction of thoracic region: Secondary | ICD-10-CM | POA: Diagnosis not present

## 2018-06-21 DIAGNOSIS — M9903 Segmental and somatic dysfunction of lumbar region: Secondary | ICD-10-CM | POA: Diagnosis not present

## 2018-06-21 DIAGNOSIS — M9901 Segmental and somatic dysfunction of cervical region: Secondary | ICD-10-CM | POA: Diagnosis not present

## 2018-06-21 DIAGNOSIS — M5032 Other cervical disc degeneration, mid-cervical region, unspecified level: Secondary | ICD-10-CM | POA: Diagnosis not present

## 2018-06-21 DIAGNOSIS — S161XXA Strain of muscle, fascia and tendon at neck level, initial encounter: Secondary | ICD-10-CM | POA: Diagnosis not present

## 2018-06-21 DIAGNOSIS — M4316 Spondylolisthesis, lumbar region: Secondary | ICD-10-CM | POA: Diagnosis not present

## 2018-06-21 DIAGNOSIS — M545 Low back pain: Secondary | ICD-10-CM | POA: Diagnosis not present

## 2018-06-21 DIAGNOSIS — S29019A Strain of muscle and tendon of unspecified wall of thorax, initial encounter: Secondary | ICD-10-CM | POA: Diagnosis not present

## 2018-06-21 DIAGNOSIS — M542 Cervicalgia: Secondary | ICD-10-CM | POA: Diagnosis not present

## 2018-06-23 DIAGNOSIS — M9901 Segmental and somatic dysfunction of cervical region: Secondary | ICD-10-CM | POA: Diagnosis not present

## 2018-06-23 DIAGNOSIS — M9902 Segmental and somatic dysfunction of thoracic region: Secondary | ICD-10-CM | POA: Diagnosis not present

## 2018-06-23 DIAGNOSIS — M4316 Spondylolisthesis, lumbar region: Secondary | ICD-10-CM | POA: Diagnosis not present

## 2018-06-23 DIAGNOSIS — M542 Cervicalgia: Secondary | ICD-10-CM | POA: Diagnosis not present

## 2018-06-23 DIAGNOSIS — M5032 Other cervical disc degeneration, mid-cervical region, unspecified level: Secondary | ICD-10-CM | POA: Diagnosis not present

## 2018-06-23 DIAGNOSIS — M9903 Segmental and somatic dysfunction of lumbar region: Secondary | ICD-10-CM | POA: Diagnosis not present

## 2018-06-23 DIAGNOSIS — S39012A Strain of muscle, fascia and tendon of lower back, initial encounter: Secondary | ICD-10-CM | POA: Diagnosis not present

## 2018-06-23 DIAGNOSIS — S29019A Strain of muscle and tendon of unspecified wall of thorax, initial encounter: Secondary | ICD-10-CM | POA: Diagnosis not present

## 2018-06-23 DIAGNOSIS — S161XXA Strain of muscle, fascia and tendon at neck level, initial encounter: Secondary | ICD-10-CM | POA: Diagnosis not present

## 2018-06-23 DIAGNOSIS — M545 Low back pain: Secondary | ICD-10-CM | POA: Diagnosis not present

## 2018-06-24 DIAGNOSIS — M4316 Spondylolisthesis, lumbar region: Secondary | ICD-10-CM | POA: Diagnosis not present

## 2018-06-24 DIAGNOSIS — S161XXA Strain of muscle, fascia and tendon at neck level, initial encounter: Secondary | ICD-10-CM | POA: Diagnosis not present

## 2018-06-24 DIAGNOSIS — M9903 Segmental and somatic dysfunction of lumbar region: Secondary | ICD-10-CM | POA: Diagnosis not present

## 2018-06-24 DIAGNOSIS — M5032 Other cervical disc degeneration, mid-cervical region, unspecified level: Secondary | ICD-10-CM | POA: Diagnosis not present

## 2018-06-24 DIAGNOSIS — S29019A Strain of muscle and tendon of unspecified wall of thorax, initial encounter: Secondary | ICD-10-CM | POA: Diagnosis not present

## 2018-06-24 DIAGNOSIS — M9902 Segmental and somatic dysfunction of thoracic region: Secondary | ICD-10-CM | POA: Diagnosis not present

## 2018-06-24 DIAGNOSIS — S39012A Strain of muscle, fascia and tendon of lower back, initial encounter: Secondary | ICD-10-CM | POA: Diagnosis not present

## 2018-06-24 DIAGNOSIS — M9901 Segmental and somatic dysfunction of cervical region: Secondary | ICD-10-CM | POA: Diagnosis not present

## 2018-06-24 DIAGNOSIS — M542 Cervicalgia: Secondary | ICD-10-CM | POA: Diagnosis not present

## 2018-06-24 DIAGNOSIS — M545 Low back pain: Secondary | ICD-10-CM | POA: Diagnosis not present

## 2018-06-28 DIAGNOSIS — S161XXA Strain of muscle, fascia and tendon at neck level, initial encounter: Secondary | ICD-10-CM | POA: Diagnosis not present

## 2018-06-28 DIAGNOSIS — M9902 Segmental and somatic dysfunction of thoracic region: Secondary | ICD-10-CM | POA: Diagnosis not present

## 2018-06-28 DIAGNOSIS — M5032 Other cervical disc degeneration, mid-cervical region, unspecified level: Secondary | ICD-10-CM | POA: Diagnosis not present

## 2018-06-28 DIAGNOSIS — M545 Low back pain: Secondary | ICD-10-CM | POA: Diagnosis not present

## 2018-06-28 DIAGNOSIS — S39012A Strain of muscle, fascia and tendon of lower back, initial encounter: Secondary | ICD-10-CM | POA: Diagnosis not present

## 2018-06-28 DIAGNOSIS — M4316 Spondylolisthesis, lumbar region: Secondary | ICD-10-CM | POA: Diagnosis not present

## 2018-06-28 DIAGNOSIS — M9901 Segmental and somatic dysfunction of cervical region: Secondary | ICD-10-CM | POA: Diagnosis not present

## 2018-06-28 DIAGNOSIS — S29019A Strain of muscle and tendon of unspecified wall of thorax, initial encounter: Secondary | ICD-10-CM | POA: Diagnosis not present

## 2018-06-28 DIAGNOSIS — M9903 Segmental and somatic dysfunction of lumbar region: Secondary | ICD-10-CM | POA: Diagnosis not present

## 2018-06-28 DIAGNOSIS — M542 Cervicalgia: Secondary | ICD-10-CM | POA: Diagnosis not present

## 2018-06-30 DIAGNOSIS — M9902 Segmental and somatic dysfunction of thoracic region: Secondary | ICD-10-CM | POA: Diagnosis not present

## 2018-06-30 DIAGNOSIS — S39012A Strain of muscle, fascia and tendon of lower back, initial encounter: Secondary | ICD-10-CM | POA: Diagnosis not present

## 2018-06-30 DIAGNOSIS — M9901 Segmental and somatic dysfunction of cervical region: Secondary | ICD-10-CM | POA: Diagnosis not present

## 2018-06-30 DIAGNOSIS — M545 Low back pain: Secondary | ICD-10-CM | POA: Diagnosis not present

## 2018-06-30 DIAGNOSIS — M4316 Spondylolisthesis, lumbar region: Secondary | ICD-10-CM | POA: Diagnosis not present

## 2018-06-30 DIAGNOSIS — M542 Cervicalgia: Secondary | ICD-10-CM | POA: Diagnosis not present

## 2018-06-30 DIAGNOSIS — M5032 Other cervical disc degeneration, mid-cervical region, unspecified level: Secondary | ICD-10-CM | POA: Diagnosis not present

## 2018-06-30 DIAGNOSIS — M9903 Segmental and somatic dysfunction of lumbar region: Secondary | ICD-10-CM | POA: Diagnosis not present

## 2018-06-30 DIAGNOSIS — S161XXA Strain of muscle, fascia and tendon at neck level, initial encounter: Secondary | ICD-10-CM | POA: Diagnosis not present

## 2018-06-30 DIAGNOSIS — S29019A Strain of muscle and tendon of unspecified wall of thorax, initial encounter: Secondary | ICD-10-CM | POA: Diagnosis not present

## 2018-07-13 DIAGNOSIS — M545 Low back pain: Secondary | ICD-10-CM | POA: Diagnosis not present

## 2018-07-13 DIAGNOSIS — S161XXA Strain of muscle, fascia and tendon at neck level, initial encounter: Secondary | ICD-10-CM | POA: Diagnosis not present

## 2018-07-13 DIAGNOSIS — M9903 Segmental and somatic dysfunction of lumbar region: Secondary | ICD-10-CM | POA: Diagnosis not present

## 2018-07-13 DIAGNOSIS — M9902 Segmental and somatic dysfunction of thoracic region: Secondary | ICD-10-CM | POA: Diagnosis not present

## 2018-07-13 DIAGNOSIS — S29019A Strain of muscle and tendon of unspecified wall of thorax, initial encounter: Secondary | ICD-10-CM | POA: Diagnosis not present

## 2018-07-13 DIAGNOSIS — M5032 Other cervical disc degeneration, mid-cervical region, unspecified level: Secondary | ICD-10-CM | POA: Diagnosis not present

## 2018-07-13 DIAGNOSIS — M4316 Spondylolisthesis, lumbar region: Secondary | ICD-10-CM | POA: Diagnosis not present

## 2018-07-13 DIAGNOSIS — M542 Cervicalgia: Secondary | ICD-10-CM | POA: Diagnosis not present

## 2018-07-13 DIAGNOSIS — M9901 Segmental and somatic dysfunction of cervical region: Secondary | ICD-10-CM | POA: Diagnosis not present

## 2018-07-13 DIAGNOSIS — S39012A Strain of muscle, fascia and tendon of lower back, initial encounter: Secondary | ICD-10-CM | POA: Diagnosis not present

## 2018-07-16 DIAGNOSIS — M545 Low back pain: Secondary | ICD-10-CM | POA: Diagnosis not present

## 2018-07-16 DIAGNOSIS — M9904 Segmental and somatic dysfunction of sacral region: Secondary | ICD-10-CM | POA: Diagnosis not present

## 2018-07-16 DIAGNOSIS — M9902 Segmental and somatic dysfunction of thoracic region: Secondary | ICD-10-CM | POA: Diagnosis not present

## 2018-07-16 DIAGNOSIS — M9903 Segmental and somatic dysfunction of lumbar region: Secondary | ICD-10-CM | POA: Diagnosis not present

## 2018-07-16 DIAGNOSIS — S39012A Strain of muscle, fascia and tendon of lower back, initial encounter: Secondary | ICD-10-CM | POA: Diagnosis not present

## 2018-07-16 DIAGNOSIS — M9901 Segmental and somatic dysfunction of cervical region: Secondary | ICD-10-CM | POA: Diagnosis not present

## 2018-07-16 DIAGNOSIS — S161XXA Strain of muscle, fascia and tendon at neck level, initial encounter: Secondary | ICD-10-CM | POA: Diagnosis not present

## 2018-07-16 DIAGNOSIS — S29019A Strain of muscle and tendon of unspecified wall of thorax, initial encounter: Secondary | ICD-10-CM | POA: Diagnosis not present

## 2018-07-16 DIAGNOSIS — M4316 Spondylolisthesis, lumbar region: Secondary | ICD-10-CM | POA: Diagnosis not present

## 2018-07-16 DIAGNOSIS — M5032 Other cervical disc degeneration, mid-cervical region, unspecified level: Secondary | ICD-10-CM | POA: Diagnosis not present

## 2018-07-20 DIAGNOSIS — M4316 Spondylolisthesis, lumbar region: Secondary | ICD-10-CM | POA: Diagnosis not present

## 2018-07-20 DIAGNOSIS — S161XXA Strain of muscle, fascia and tendon at neck level, initial encounter: Secondary | ICD-10-CM | POA: Diagnosis not present

## 2018-07-20 DIAGNOSIS — M9904 Segmental and somatic dysfunction of sacral region: Secondary | ICD-10-CM | POA: Diagnosis not present

## 2018-07-20 DIAGNOSIS — M9902 Segmental and somatic dysfunction of thoracic region: Secondary | ICD-10-CM | POA: Diagnosis not present

## 2018-07-20 DIAGNOSIS — M9903 Segmental and somatic dysfunction of lumbar region: Secondary | ICD-10-CM | POA: Diagnosis not present

## 2018-07-20 DIAGNOSIS — M9901 Segmental and somatic dysfunction of cervical region: Secondary | ICD-10-CM | POA: Diagnosis not present

## 2018-07-20 DIAGNOSIS — S39012A Strain of muscle, fascia and tendon of lower back, initial encounter: Secondary | ICD-10-CM | POA: Diagnosis not present

## 2018-07-20 DIAGNOSIS — S29019A Strain of muscle and tendon of unspecified wall of thorax, initial encounter: Secondary | ICD-10-CM | POA: Diagnosis not present

## 2018-07-20 DIAGNOSIS — M545 Low back pain: Secondary | ICD-10-CM | POA: Diagnosis not present

## 2018-07-20 DIAGNOSIS — M5032 Other cervical disc degeneration, mid-cervical region, unspecified level: Secondary | ICD-10-CM | POA: Diagnosis not present

## 2018-07-26 DIAGNOSIS — S29019A Strain of muscle and tendon of unspecified wall of thorax, initial encounter: Secondary | ICD-10-CM | POA: Diagnosis not present

## 2018-07-26 DIAGNOSIS — S161XXA Strain of muscle, fascia and tendon at neck level, initial encounter: Secondary | ICD-10-CM | POA: Diagnosis not present

## 2018-07-26 DIAGNOSIS — M545 Low back pain: Secondary | ICD-10-CM | POA: Diagnosis not present

## 2018-07-26 DIAGNOSIS — S39012A Strain of muscle, fascia and tendon of lower back, initial encounter: Secondary | ICD-10-CM | POA: Diagnosis not present

## 2018-07-26 DIAGNOSIS — M9902 Segmental and somatic dysfunction of thoracic region: Secondary | ICD-10-CM | POA: Diagnosis not present

## 2018-07-26 DIAGNOSIS — M9903 Segmental and somatic dysfunction of lumbar region: Secondary | ICD-10-CM | POA: Diagnosis not present

## 2018-07-26 DIAGNOSIS — M9904 Segmental and somatic dysfunction of sacral region: Secondary | ICD-10-CM | POA: Diagnosis not present

## 2018-07-26 DIAGNOSIS — M9901 Segmental and somatic dysfunction of cervical region: Secondary | ICD-10-CM | POA: Diagnosis not present

## 2018-07-26 DIAGNOSIS — M4316 Spondylolisthesis, lumbar region: Secondary | ICD-10-CM | POA: Diagnosis not present

## 2018-07-26 DIAGNOSIS — M5032 Other cervical disc degeneration, mid-cervical region, unspecified level: Secondary | ICD-10-CM | POA: Diagnosis not present

## 2018-07-28 DIAGNOSIS — M5032 Other cervical disc degeneration, mid-cervical region, unspecified level: Secondary | ICD-10-CM | POA: Diagnosis not present

## 2018-07-28 DIAGNOSIS — M9902 Segmental and somatic dysfunction of thoracic region: Secondary | ICD-10-CM | POA: Diagnosis not present

## 2018-07-28 DIAGNOSIS — M545 Low back pain: Secondary | ICD-10-CM | POA: Diagnosis not present

## 2018-07-28 DIAGNOSIS — S161XXA Strain of muscle, fascia and tendon at neck level, initial encounter: Secondary | ICD-10-CM | POA: Diagnosis not present

## 2018-07-28 DIAGNOSIS — S29019A Strain of muscle and tendon of unspecified wall of thorax, initial encounter: Secondary | ICD-10-CM | POA: Diagnosis not present

## 2018-07-28 DIAGNOSIS — S39012A Strain of muscle, fascia and tendon of lower back, initial encounter: Secondary | ICD-10-CM | POA: Diagnosis not present

## 2018-07-28 DIAGNOSIS — M4316 Spondylolisthesis, lumbar region: Secondary | ICD-10-CM | POA: Diagnosis not present

## 2018-07-28 DIAGNOSIS — M9904 Segmental and somatic dysfunction of sacral region: Secondary | ICD-10-CM | POA: Diagnosis not present

## 2018-07-28 DIAGNOSIS — M9903 Segmental and somatic dysfunction of lumbar region: Secondary | ICD-10-CM | POA: Diagnosis not present

## 2018-07-28 DIAGNOSIS — M9901 Segmental and somatic dysfunction of cervical region: Secondary | ICD-10-CM | POA: Diagnosis not present

## 2018-08-04 DIAGNOSIS — M9904 Segmental and somatic dysfunction of sacral region: Secondary | ICD-10-CM | POA: Diagnosis not present

## 2018-08-04 DIAGNOSIS — S29019A Strain of muscle and tendon of unspecified wall of thorax, initial encounter: Secondary | ICD-10-CM | POA: Diagnosis not present

## 2018-08-04 DIAGNOSIS — S161XXA Strain of muscle, fascia and tendon at neck level, initial encounter: Secondary | ICD-10-CM | POA: Diagnosis not present

## 2018-08-04 DIAGNOSIS — S39012A Strain of muscle, fascia and tendon of lower back, initial encounter: Secondary | ICD-10-CM | POA: Diagnosis not present

## 2018-08-04 DIAGNOSIS — M5032 Other cervical disc degeneration, mid-cervical region, unspecified level: Secondary | ICD-10-CM | POA: Diagnosis not present

## 2018-08-04 DIAGNOSIS — M9901 Segmental and somatic dysfunction of cervical region: Secondary | ICD-10-CM | POA: Diagnosis not present

## 2018-08-04 DIAGNOSIS — M9903 Segmental and somatic dysfunction of lumbar region: Secondary | ICD-10-CM | POA: Diagnosis not present

## 2018-08-04 DIAGNOSIS — M4316 Spondylolisthesis, lumbar region: Secondary | ICD-10-CM | POA: Diagnosis not present

## 2018-08-04 DIAGNOSIS — M9902 Segmental and somatic dysfunction of thoracic region: Secondary | ICD-10-CM | POA: Diagnosis not present

## 2018-08-04 DIAGNOSIS — M545 Low back pain: Secondary | ICD-10-CM | POA: Diagnosis not present

## 2018-08-10 DIAGNOSIS — R69 Illness, unspecified: Secondary | ICD-10-CM | POA: Diagnosis not present

## 2018-08-11 DIAGNOSIS — S161XXA Strain of muscle, fascia and tendon at neck level, initial encounter: Secondary | ICD-10-CM | POA: Diagnosis not present

## 2018-08-11 DIAGNOSIS — M545 Low back pain: Secondary | ICD-10-CM | POA: Diagnosis not present

## 2018-08-11 DIAGNOSIS — M9901 Segmental and somatic dysfunction of cervical region: Secondary | ICD-10-CM | POA: Diagnosis not present

## 2018-08-11 DIAGNOSIS — M9903 Segmental and somatic dysfunction of lumbar region: Secondary | ICD-10-CM | POA: Diagnosis not present

## 2018-08-11 DIAGNOSIS — M4316 Spondylolisthesis, lumbar region: Secondary | ICD-10-CM | POA: Diagnosis not present

## 2018-08-11 DIAGNOSIS — S29019A Strain of muscle and tendon of unspecified wall of thorax, initial encounter: Secondary | ICD-10-CM | POA: Diagnosis not present

## 2018-08-11 DIAGNOSIS — M9902 Segmental and somatic dysfunction of thoracic region: Secondary | ICD-10-CM | POA: Diagnosis not present

## 2018-08-11 DIAGNOSIS — M9904 Segmental and somatic dysfunction of sacral region: Secondary | ICD-10-CM | POA: Diagnosis not present

## 2018-08-11 DIAGNOSIS — S39012A Strain of muscle, fascia and tendon of lower back, initial encounter: Secondary | ICD-10-CM | POA: Diagnosis not present

## 2018-08-11 DIAGNOSIS — M5032 Other cervical disc degeneration, mid-cervical region, unspecified level: Secondary | ICD-10-CM | POA: Diagnosis not present

## 2018-08-18 DIAGNOSIS — E669 Obesity, unspecified: Secondary | ICD-10-CM | POA: Diagnosis not present

## 2018-08-18 DIAGNOSIS — M4316 Spondylolisthesis, lumbar region: Secondary | ICD-10-CM | POA: Diagnosis not present

## 2018-08-18 DIAGNOSIS — I519 Heart disease, unspecified: Secondary | ICD-10-CM | POA: Insufficient documentation

## 2018-08-18 DIAGNOSIS — K210 Gastro-esophageal reflux disease with esophagitis: Secondary | ICD-10-CM | POA: Diagnosis not present

## 2018-08-18 DIAGNOSIS — I10 Essential (primary) hypertension: Secondary | ICD-10-CM | POA: Diagnosis not present

## 2018-08-18 DIAGNOSIS — I501 Left ventricular failure: Secondary | ICD-10-CM | POA: Diagnosis not present

## 2018-08-18 DIAGNOSIS — M545 Low back pain: Secondary | ICD-10-CM | POA: Diagnosis not present

## 2018-08-18 DIAGNOSIS — M797 Fibromyalgia: Secondary | ICD-10-CM | POA: Diagnosis not present

## 2018-08-18 DIAGNOSIS — R69 Illness, unspecified: Secondary | ICD-10-CM | POA: Diagnosis not present

## 2018-08-18 DIAGNOSIS — M9903 Segmental and somatic dysfunction of lumbar region: Secondary | ICD-10-CM | POA: Diagnosis not present

## 2018-08-18 DIAGNOSIS — Z8669 Personal history of other diseases of the nervous system and sense organs: Secondary | ICD-10-CM | POA: Diagnosis not present

## 2018-08-18 DIAGNOSIS — Z23 Encounter for immunization: Secondary | ICD-10-CM | POA: Diagnosis not present

## 2018-08-18 DIAGNOSIS — Z760 Encounter for issue of repeat prescription: Secondary | ICD-10-CM | POA: Diagnosis not present

## 2018-08-18 DIAGNOSIS — Z0001 Encounter for general adult medical examination with abnormal findings: Secondary | ICD-10-CM | POA: Diagnosis not present

## 2018-08-18 DIAGNOSIS — M5032 Other cervical disc degeneration, mid-cervical region, unspecified level: Secondary | ICD-10-CM | POA: Diagnosis not present

## 2018-08-18 DIAGNOSIS — I119 Hypertensive heart disease without heart failure: Secondary | ICD-10-CM | POA: Insufficient documentation

## 2018-08-18 DIAGNOSIS — M9901 Segmental and somatic dysfunction of cervical region: Secondary | ICD-10-CM | POA: Diagnosis not present

## 2018-08-18 DIAGNOSIS — E782 Mixed hyperlipidemia: Secondary | ICD-10-CM | POA: Diagnosis not present

## 2018-08-18 DIAGNOSIS — S39012A Strain of muscle, fascia and tendon of lower back, initial encounter: Secondary | ICD-10-CM | POA: Diagnosis not present

## 2018-08-18 DIAGNOSIS — I429 Cardiomyopathy, unspecified: Secondary | ICD-10-CM | POA: Diagnosis not present

## 2018-08-18 DIAGNOSIS — Z0000 Encounter for general adult medical examination without abnormal findings: Secondary | ICD-10-CM | POA: Insufficient documentation

## 2018-08-18 DIAGNOSIS — M9904 Segmental and somatic dysfunction of sacral region: Secondary | ICD-10-CM | POA: Diagnosis not present

## 2018-08-18 DIAGNOSIS — I43 Cardiomyopathy in diseases classified elsewhere: Secondary | ICD-10-CM | POA: Diagnosis not present

## 2018-08-18 DIAGNOSIS — S161XXA Strain of muscle, fascia and tendon at neck level, initial encounter: Secondary | ICD-10-CM | POA: Diagnosis not present

## 2018-08-18 DIAGNOSIS — K219 Gastro-esophageal reflux disease without esophagitis: Secondary | ICD-10-CM | POA: Insufficient documentation

## 2018-08-18 DIAGNOSIS — S29019A Strain of muscle and tendon of unspecified wall of thorax, initial encounter: Secondary | ICD-10-CM | POA: Diagnosis not present

## 2018-08-18 DIAGNOSIS — Z83518 Family history of other specified eye disorder: Secondary | ICD-10-CM | POA: Insufficient documentation

## 2018-08-18 DIAGNOSIS — Z9849 Cataract extraction status, unspecified eye: Secondary | ICD-10-CM | POA: Insufficient documentation

## 2018-08-18 DIAGNOSIS — Z79899 Other long term (current) drug therapy: Secondary | ICD-10-CM | POA: Diagnosis not present

## 2018-08-18 DIAGNOSIS — M9902 Segmental and somatic dysfunction of thoracic region: Secondary | ICD-10-CM | POA: Diagnosis not present

## 2018-08-26 DIAGNOSIS — S161XXA Strain of muscle, fascia and tendon at neck level, initial encounter: Secondary | ICD-10-CM | POA: Diagnosis not present

## 2018-08-26 DIAGNOSIS — M9902 Segmental and somatic dysfunction of thoracic region: Secondary | ICD-10-CM | POA: Diagnosis not present

## 2018-08-26 DIAGNOSIS — M4316 Spondylolisthesis, lumbar region: Secondary | ICD-10-CM | POA: Diagnosis not present

## 2018-08-26 DIAGNOSIS — M9901 Segmental and somatic dysfunction of cervical region: Secondary | ICD-10-CM | POA: Diagnosis not present

## 2018-08-26 DIAGNOSIS — M9903 Segmental and somatic dysfunction of lumbar region: Secondary | ICD-10-CM | POA: Diagnosis not present

## 2018-08-26 DIAGNOSIS — S39012A Strain of muscle, fascia and tendon of lower back, initial encounter: Secondary | ICD-10-CM | POA: Diagnosis not present

## 2018-08-26 DIAGNOSIS — M9904 Segmental and somatic dysfunction of sacral region: Secondary | ICD-10-CM | POA: Diagnosis not present

## 2018-08-26 DIAGNOSIS — M5032 Other cervical disc degeneration, mid-cervical region, unspecified level: Secondary | ICD-10-CM | POA: Diagnosis not present

## 2018-08-26 DIAGNOSIS — S29019A Strain of muscle and tendon of unspecified wall of thorax, initial encounter: Secondary | ICD-10-CM | POA: Diagnosis not present

## 2018-08-26 DIAGNOSIS — M545 Low back pain: Secondary | ICD-10-CM | POA: Diagnosis not present

## 2018-09-20 DIAGNOSIS — M9902 Segmental and somatic dysfunction of thoracic region: Secondary | ICD-10-CM | POA: Diagnosis not present

## 2018-09-20 DIAGNOSIS — S29019A Strain of muscle and tendon of unspecified wall of thorax, initial encounter: Secondary | ICD-10-CM | POA: Diagnosis not present

## 2018-09-20 DIAGNOSIS — M4316 Spondylolisthesis, lumbar region: Secondary | ICD-10-CM | POA: Diagnosis not present

## 2018-09-20 DIAGNOSIS — M9901 Segmental and somatic dysfunction of cervical region: Secondary | ICD-10-CM | POA: Diagnosis not present

## 2018-09-20 DIAGNOSIS — M9903 Segmental and somatic dysfunction of lumbar region: Secondary | ICD-10-CM | POA: Diagnosis not present

## 2018-09-20 DIAGNOSIS — M9904 Segmental and somatic dysfunction of sacral region: Secondary | ICD-10-CM | POA: Diagnosis not present

## 2018-09-20 DIAGNOSIS — S161XXA Strain of muscle, fascia and tendon at neck level, initial encounter: Secondary | ICD-10-CM | POA: Diagnosis not present

## 2018-09-20 DIAGNOSIS — S39012A Strain of muscle, fascia and tendon of lower back, initial encounter: Secondary | ICD-10-CM | POA: Diagnosis not present

## 2018-09-20 DIAGNOSIS — M5032 Other cervical disc degeneration, mid-cervical region, unspecified level: Secondary | ICD-10-CM | POA: Diagnosis not present

## 2018-09-20 DIAGNOSIS — M545 Low back pain: Secondary | ICD-10-CM | POA: Diagnosis not present

## 2018-09-23 DIAGNOSIS — R12 Heartburn: Secondary | ICD-10-CM | POA: Diagnosis not present

## 2018-09-23 DIAGNOSIS — R221 Localized swelling, mass and lump, neck: Secondary | ICD-10-CM | POA: Diagnosis not present

## 2018-09-29 DIAGNOSIS — R5383 Other fatigue: Secondary | ICD-10-CM | POA: Diagnosis not present

## 2018-09-29 DIAGNOSIS — R0609 Other forms of dyspnea: Secondary | ICD-10-CM | POA: Diagnosis not present

## 2018-09-29 DIAGNOSIS — I208 Other forms of angina pectoris: Secondary | ICD-10-CM | POA: Diagnosis not present

## 2018-09-29 DIAGNOSIS — E785 Hyperlipidemia, unspecified: Secondary | ICD-10-CM | POA: Diagnosis not present

## 2018-09-29 DIAGNOSIS — I519 Heart disease, unspecified: Secondary | ICD-10-CM | POA: Diagnosis not present

## 2018-09-29 DIAGNOSIS — I10 Essential (primary) hypertension: Secondary | ICD-10-CM | POA: Diagnosis not present

## 2018-09-29 DIAGNOSIS — R931 Abnormal findings on diagnostic imaging of heart and coronary circulation: Secondary | ICD-10-CM | POA: Diagnosis not present

## 2018-09-29 DIAGNOSIS — R9431 Abnormal electrocardiogram [ECG] [EKG]: Secondary | ICD-10-CM | POA: Diagnosis not present

## 2018-10-08 DIAGNOSIS — M9904 Segmental and somatic dysfunction of sacral region: Secondary | ICD-10-CM | POA: Diagnosis not present

## 2018-10-08 DIAGNOSIS — M9903 Segmental and somatic dysfunction of lumbar region: Secondary | ICD-10-CM | POA: Diagnosis not present

## 2018-10-08 DIAGNOSIS — M9901 Segmental and somatic dysfunction of cervical region: Secondary | ICD-10-CM | POA: Diagnosis not present

## 2018-10-08 DIAGNOSIS — R52 Pain, unspecified: Secondary | ICD-10-CM | POA: Insufficient documentation

## 2018-10-08 DIAGNOSIS — Z9849 Cataract extraction status, unspecified eye: Secondary | ICD-10-CM | POA: Diagnosis not present

## 2018-10-08 DIAGNOSIS — M4316 Spondylolisthesis, lumbar region: Secondary | ICD-10-CM | POA: Diagnosis not present

## 2018-10-08 DIAGNOSIS — R001 Bradycardia, unspecified: Secondary | ICD-10-CM | POA: Diagnosis not present

## 2018-10-08 DIAGNOSIS — M9902 Segmental and somatic dysfunction of thoracic region: Secondary | ICD-10-CM | POA: Diagnosis not present

## 2018-10-08 DIAGNOSIS — S29019A Strain of muscle and tendon of unspecified wall of thorax, initial encounter: Secondary | ICD-10-CM | POA: Diagnosis not present

## 2018-10-08 DIAGNOSIS — S39012A Strain of muscle, fascia and tendon of lower back, initial encounter: Secondary | ICD-10-CM | POA: Diagnosis not present

## 2018-10-08 DIAGNOSIS — S161XXA Strain of muscle, fascia and tendon at neck level, initial encounter: Secondary | ICD-10-CM | POA: Diagnosis not present

## 2018-10-08 DIAGNOSIS — M545 Low back pain: Secondary | ICD-10-CM | POA: Diagnosis not present

## 2018-10-08 DIAGNOSIS — Z83518 Family history of other specified eye disorder: Secondary | ICD-10-CM | POA: Diagnosis not present

## 2018-10-08 DIAGNOSIS — M5032 Other cervical disc degeneration, mid-cervical region, unspecified level: Secondary | ICD-10-CM | POA: Diagnosis not present

## 2018-10-08 DIAGNOSIS — M797 Fibromyalgia: Secondary | ICD-10-CM | POA: Diagnosis not present

## 2018-10-11 DIAGNOSIS — Z791 Long term (current) use of non-steroidal anti-inflammatories (NSAID): Secondary | ICD-10-CM | POA: Diagnosis not present

## 2018-10-11 DIAGNOSIS — K3189 Other diseases of stomach and duodenum: Secondary | ICD-10-CM | POA: Diagnosis not present

## 2018-10-11 DIAGNOSIS — K2970 Gastritis, unspecified, without bleeding: Secondary | ICD-10-CM | POA: Diagnosis not present

## 2018-10-11 DIAGNOSIS — K219 Gastro-esophageal reflux disease without esophagitis: Secondary | ICD-10-CM | POA: Diagnosis not present

## 2018-10-11 DIAGNOSIS — R221 Localized swelling, mass and lump, neck: Secondary | ICD-10-CM | POA: Diagnosis not present

## 2018-10-11 DIAGNOSIS — K253 Acute gastric ulcer without hemorrhage or perforation: Secondary | ICD-10-CM | POA: Diagnosis not present

## 2018-10-12 DIAGNOSIS — R001 Bradycardia, unspecified: Secondary | ICD-10-CM | POA: Diagnosis not present

## 2018-10-19 DIAGNOSIS — R52 Pain, unspecified: Secondary | ICD-10-CM | POA: Diagnosis not present

## 2018-10-19 DIAGNOSIS — M62522 Muscle wasting and atrophy, not elsewhere classified, left upper arm: Secondary | ICD-10-CM | POA: Diagnosis not present

## 2018-10-19 DIAGNOSIS — M62551 Muscle wasting and atrophy, not elsewhere classified, right thigh: Secondary | ICD-10-CM | POA: Diagnosis not present

## 2018-10-19 DIAGNOSIS — M62521 Muscle wasting and atrophy, not elsewhere classified, right upper arm: Secondary | ICD-10-CM | POA: Diagnosis not present

## 2018-10-19 DIAGNOSIS — R2689 Other abnormalities of gait and mobility: Secondary | ICD-10-CM | POA: Diagnosis not present

## 2018-10-19 DIAGNOSIS — M62552 Muscle wasting and atrophy, not elsewhere classified, left thigh: Secondary | ICD-10-CM | POA: Diagnosis not present

## 2018-10-19 DIAGNOSIS — M797 Fibromyalgia: Secondary | ICD-10-CM | POA: Diagnosis not present

## 2018-10-22 DIAGNOSIS — M4316 Spondylolisthesis, lumbar region: Secondary | ICD-10-CM | POA: Diagnosis not present

## 2018-10-22 DIAGNOSIS — M5032 Other cervical disc degeneration, mid-cervical region, unspecified level: Secondary | ICD-10-CM | POA: Diagnosis not present

## 2018-10-22 DIAGNOSIS — S39012A Strain of muscle, fascia and tendon of lower back, initial encounter: Secondary | ICD-10-CM | POA: Diagnosis not present

## 2018-10-22 DIAGNOSIS — M797 Fibromyalgia: Secondary | ICD-10-CM | POA: Diagnosis not present

## 2018-10-22 DIAGNOSIS — M62521 Muscle wasting and atrophy, not elsewhere classified, right upper arm: Secondary | ICD-10-CM | POA: Diagnosis not present

## 2018-10-22 DIAGNOSIS — M62551 Muscle wasting and atrophy, not elsewhere classified, right thigh: Secondary | ICD-10-CM | POA: Diagnosis not present

## 2018-10-22 DIAGNOSIS — S29019A Strain of muscle and tendon of unspecified wall of thorax, initial encounter: Secondary | ICD-10-CM | POA: Diagnosis not present

## 2018-10-22 DIAGNOSIS — S161XXA Strain of muscle, fascia and tendon at neck level, initial encounter: Secondary | ICD-10-CM | POA: Diagnosis not present

## 2018-10-22 DIAGNOSIS — M9901 Segmental and somatic dysfunction of cervical region: Secondary | ICD-10-CM | POA: Diagnosis not present

## 2018-10-22 DIAGNOSIS — M62522 Muscle wasting and atrophy, not elsewhere classified, left upper arm: Secondary | ICD-10-CM | POA: Diagnosis not present

## 2018-10-22 DIAGNOSIS — R52 Pain, unspecified: Secondary | ICD-10-CM | POA: Diagnosis not present

## 2018-10-22 DIAGNOSIS — M9902 Segmental and somatic dysfunction of thoracic region: Secondary | ICD-10-CM | POA: Diagnosis not present

## 2018-10-22 DIAGNOSIS — R2689 Other abnormalities of gait and mobility: Secondary | ICD-10-CM | POA: Diagnosis not present

## 2018-10-22 DIAGNOSIS — M9904 Segmental and somatic dysfunction of sacral region: Secondary | ICD-10-CM | POA: Diagnosis not present

## 2018-10-22 DIAGNOSIS — M545 Low back pain: Secondary | ICD-10-CM | POA: Diagnosis not present

## 2018-10-22 DIAGNOSIS — M62552 Muscle wasting and atrophy, not elsewhere classified, left thigh: Secondary | ICD-10-CM | POA: Diagnosis not present

## 2018-10-22 DIAGNOSIS — M9903 Segmental and somatic dysfunction of lumbar region: Secondary | ICD-10-CM | POA: Diagnosis not present

## 2018-12-21 DIAGNOSIS — R69 Illness, unspecified: Secondary | ICD-10-CM | POA: Diagnosis not present

## 2019-01-14 DIAGNOSIS — B349 Viral infection, unspecified: Secondary | ICD-10-CM | POA: Diagnosis not present

## 2019-01-28 DIAGNOSIS — M25551 Pain in right hip: Secondary | ICD-10-CM | POA: Diagnosis not present

## 2019-01-28 DIAGNOSIS — M545 Low back pain: Secondary | ICD-10-CM | POA: Diagnosis not present

## 2019-01-28 DIAGNOSIS — M25562 Pain in left knee: Secondary | ICD-10-CM | POA: Diagnosis not present

## 2019-03-22 DIAGNOSIS — M25571 Pain in right ankle and joints of right foot: Secondary | ICD-10-CM | POA: Diagnosis not present

## 2019-03-22 DIAGNOSIS — M79671 Pain in right foot: Secondary | ICD-10-CM | POA: Diagnosis not present

## 2019-03-22 DIAGNOSIS — M25471 Effusion, right ankle: Secondary | ICD-10-CM | POA: Diagnosis not present

## 2019-03-22 DIAGNOSIS — M7989 Other specified soft tissue disorders: Secondary | ICD-10-CM | POA: Diagnosis not present

## 2019-03-27 DIAGNOSIS — M79671 Pain in right foot: Secondary | ICD-10-CM | POA: Insufficient documentation

## 2019-03-31 DIAGNOSIS — R69 Illness, unspecified: Secondary | ICD-10-CM | POA: Diagnosis not present

## 2019-05-13 DIAGNOSIS — R5383 Other fatigue: Secondary | ICD-10-CM | POA: Diagnosis not present

## 2019-05-13 DIAGNOSIS — K219 Gastro-esophageal reflux disease without esophagitis: Secondary | ICD-10-CM | POA: Diagnosis not present

## 2019-05-16 DIAGNOSIS — G8929 Other chronic pain: Secondary | ICD-10-CM | POA: Diagnosis not present

## 2019-05-16 DIAGNOSIS — E785 Hyperlipidemia, unspecified: Secondary | ICD-10-CM | POA: Diagnosis not present

## 2019-05-16 DIAGNOSIS — K219 Gastro-esophageal reflux disease without esophagitis: Secondary | ICD-10-CM | POA: Diagnosis not present

## 2019-05-16 DIAGNOSIS — Z881 Allergy status to other antibiotic agents status: Secondary | ICD-10-CM | POA: Diagnosis not present

## 2019-05-16 DIAGNOSIS — Z880 Allergy status to penicillin: Secondary | ICD-10-CM | POA: Diagnosis not present

## 2019-05-16 DIAGNOSIS — M797 Fibromyalgia: Secondary | ICD-10-CM | POA: Diagnosis not present

## 2019-05-16 DIAGNOSIS — Z823 Family history of stroke: Secondary | ICD-10-CM | POA: Diagnosis not present

## 2019-05-16 DIAGNOSIS — Z8249 Family history of ischemic heart disease and other diseases of the circulatory system: Secondary | ICD-10-CM | POA: Diagnosis not present

## 2019-05-16 DIAGNOSIS — E669 Obesity, unspecified: Secondary | ICD-10-CM | POA: Diagnosis not present

## 2019-05-16 DIAGNOSIS — I10 Essential (primary) hypertension: Secondary | ICD-10-CM | POA: Diagnosis not present

## 2019-05-17 DIAGNOSIS — R69 Illness, unspecified: Secondary | ICD-10-CM | POA: Diagnosis not present

## 2019-06-09 DIAGNOSIS — R5383 Other fatigue: Secondary | ICD-10-CM | POA: Diagnosis not present

## 2019-06-09 DIAGNOSIS — E538 Deficiency of other specified B group vitamins: Secondary | ICD-10-CM | POA: Diagnosis not present

## 2019-06-16 DIAGNOSIS — R5383 Other fatigue: Secondary | ICD-10-CM | POA: Diagnosis not present

## 2019-06-23 DIAGNOSIS — R5383 Other fatigue: Secondary | ICD-10-CM | POA: Diagnosis not present

## 2019-06-27 DIAGNOSIS — I10 Essential (primary) hypertension: Secondary | ICD-10-CM | POA: Diagnosis not present

## 2019-06-27 DIAGNOSIS — E6609 Other obesity due to excess calories: Secondary | ICD-10-CM | POA: Diagnosis not present

## 2019-06-27 DIAGNOSIS — E7801 Familial hypercholesterolemia: Secondary | ICD-10-CM | POA: Diagnosis not present

## 2019-06-27 DIAGNOSIS — E78019 Familial hypercholesterolemia, unspecified: Secondary | ICD-10-CM | POA: Insufficient documentation

## 2019-06-27 DIAGNOSIS — K922 Gastrointestinal hemorrhage, unspecified: Secondary | ICD-10-CM | POA: Insufficient documentation

## 2019-06-27 DIAGNOSIS — Z6837 Body mass index (BMI) 37.0-37.9, adult: Secondary | ICD-10-CM | POA: Diagnosis not present

## 2019-06-27 DIAGNOSIS — I429 Cardiomyopathy, unspecified: Secondary | ICD-10-CM | POA: Diagnosis not present

## 2019-06-30 DIAGNOSIS — E538 Deficiency of other specified B group vitamins: Secondary | ICD-10-CM | POA: Diagnosis not present

## 2019-06-30 DIAGNOSIS — R5383 Other fatigue: Secondary | ICD-10-CM | POA: Diagnosis not present

## 2019-07-04 DIAGNOSIS — S99921A Unspecified injury of right foot, initial encounter: Secondary | ICD-10-CM | POA: Diagnosis not present

## 2019-07-14 DIAGNOSIS — H04123 Dry eye syndrome of bilateral lacrimal glands: Secondary | ICD-10-CM | POA: Diagnosis not present

## 2019-07-14 DIAGNOSIS — Z961 Presence of intraocular lens: Secondary | ICD-10-CM | POA: Diagnosis not present

## 2019-07-14 DIAGNOSIS — H43813 Vitreous degeneration, bilateral: Secondary | ICD-10-CM | POA: Diagnosis not present

## 2019-07-20 DIAGNOSIS — R69 Illness, unspecified: Secondary | ICD-10-CM | POA: Diagnosis not present

## 2019-08-02 DIAGNOSIS — R69 Illness, unspecified: Secondary | ICD-10-CM | POA: Diagnosis not present

## 2019-08-10 DIAGNOSIS — R5383 Other fatigue: Secondary | ICD-10-CM | POA: Diagnosis not present

## 2019-08-14 ENCOUNTER — Encounter: Payer: Self-pay | Admitting: Gastroenterology

## 2019-08-14 DIAGNOSIS — R1011 Right upper quadrant pain: Secondary | ICD-10-CM | POA: Diagnosis not present

## 2019-08-14 DIAGNOSIS — R52 Pain, unspecified: Secondary | ICD-10-CM | POA: Diagnosis not present

## 2019-08-14 DIAGNOSIS — R1084 Generalized abdominal pain: Secondary | ICD-10-CM | POA: Diagnosis not present

## 2019-08-14 DIAGNOSIS — I10 Essential (primary) hypertension: Secondary | ICD-10-CM | POA: Diagnosis not present

## 2019-08-14 DIAGNOSIS — K279 Peptic ulcer, site unspecified, unspecified as acute or chronic, without hemorrhage or perforation: Secondary | ICD-10-CM | POA: Diagnosis not present

## 2019-08-14 DIAGNOSIS — R109 Unspecified abdominal pain: Secondary | ICD-10-CM | POA: Diagnosis not present

## 2019-08-14 DIAGNOSIS — R1031 Right lower quadrant pain: Secondary | ICD-10-CM | POA: Diagnosis not present

## 2019-08-14 DIAGNOSIS — R110 Nausea: Secondary | ICD-10-CM | POA: Diagnosis not present

## 2019-08-14 DIAGNOSIS — D7389 Other diseases of spleen: Secondary | ICD-10-CM | POA: Diagnosis not present

## 2019-08-14 DIAGNOSIS — K5730 Diverticulosis of large intestine without perforation or abscess without bleeding: Secondary | ICD-10-CM | POA: Diagnosis not present

## 2019-08-19 DIAGNOSIS — K279 Peptic ulcer, site unspecified, unspecified as acute or chronic, without hemorrhage or perforation: Secondary | ICD-10-CM | POA: Diagnosis not present

## 2019-08-19 DIAGNOSIS — Z09 Encounter for follow-up examination after completed treatment for conditions other than malignant neoplasm: Secondary | ICD-10-CM | POA: Diagnosis not present

## 2019-08-19 DIAGNOSIS — R69 Illness, unspecified: Secondary | ICD-10-CM | POA: Diagnosis not present

## 2019-08-19 DIAGNOSIS — K5730 Diverticulosis of large intestine without perforation or abscess without bleeding: Secondary | ICD-10-CM | POA: Diagnosis not present

## 2019-08-19 DIAGNOSIS — Z13228 Encounter for screening for other metabolic disorders: Secondary | ICD-10-CM | POA: Diagnosis not present

## 2019-08-19 DIAGNOSIS — D734 Cyst of spleen: Secondary | ICD-10-CM | POA: Diagnosis not present

## 2019-09-07 ENCOUNTER — Ambulatory Visit: Payer: Self-pay | Admitting: Gastroenterology

## 2019-09-15 ENCOUNTER — Other Ambulatory Visit: Payer: Self-pay

## 2019-09-15 ENCOUNTER — Encounter: Payer: Self-pay | Admitting: Gastroenterology

## 2019-09-15 ENCOUNTER — Ambulatory Visit (INDEPENDENT_AMBULATORY_CARE_PROVIDER_SITE_OTHER): Payer: Self-pay | Admitting: Gastroenterology

## 2019-09-15 VITALS — BP 124/76 | HR 56 | Temp 98.5°F | Ht 61.0 in | Wt 193.4 lb

## 2019-09-15 DIAGNOSIS — K219 Gastro-esophageal reflux disease without esophagitis: Secondary | ICD-10-CM

## 2019-09-15 DIAGNOSIS — R935 Abnormal findings on diagnostic imaging of other abdominal regions, including retroperitoneum: Secondary | ICD-10-CM

## 2019-09-15 DIAGNOSIS — D509 Iron deficiency anemia, unspecified: Secondary | ICD-10-CM

## 2019-09-15 MED ORDER — PANTOPRAZOLE SODIUM 40 MG PO TBEC: 40.0000 mg | DELAYED_RELEASE_TABLET | Freq: Two times a day (BID) | ORAL | 3 refills | Status: DC

## 2019-09-15 MED ORDER — SUPREP BOWEL PREP KIT 17.5-3.13-1.6 GM/177ML PO SOLN: 1.0000 | ORAL | 0 refills | Status: DC

## 2019-09-15 NOTE — Progress Notes (Signed)
Chief Complaint: Abdominal pain  Referring Provider: Dr. Marco Collie.      ASSESSMENT AND PLAN;   #1. GERD with epigastric pain. ? melena #2. Iron deficiency without anemia.   #3. Change in bowel habits in form of intermittent diarrhea.  She likely has underlying IBS with predominant diarrhea. #4. Abn CT per pt [facility] 08/14/2019) showing gastric wall thickening, colonic diverticulosis. #5. FH of colonic polyps.  Plan: - Proceed with EGD/colon. Discussed risks & benefits. (Risks including rare perforation req laparotomy, bleeding after biopsies/polypectomy req blood transfusion, rare chance of missing neoplasms, risks of anesthesia/sedation). Benefits outweigh the risks. Patient agrees to proceed. All the questions were answered. Consent forms given for review. - Hold iron 3 days before - CBC, CMP at time of above endoscopic procedures. - Increased Protonix 1m po bid #60 - Avoid all nonsteroidals for now. - If above WU is neg, would proceed with UKoreaabdomen, followed by HIDA with EF if needed..Marland Kitchen   HPI:    Leslie Griffin a 68y.o. female  [facility]   Patient with generalized abdominal pain of sudden onset, diarrhea without fever or chills.  Associated abdominal bloating.  [facility] with contrast showed slightly thickened pylorus possibly PUD, colonic diverticulosis without diverticulitis, 11 mm splenic cyst.  Normal CBC, CMP, UA, troponin, negative EKG.  Normal lipase.  Sent to the GI clinic for further evaluation.   Patient does feel somewhat better.  Continues to have epigastric pain with associated nausea but no vomiting.  She has been taking Protonix once a day.  Also has been having intermittent diarrhea, without hematochezia. 2-3 softer BMs per day.  She had 2 episodes of dark stools few weeks ago ?  Melena.  Per patient she has been found to be iron deficient without anemia and has been started on iron  supplements.  Cannot find any records.   SH -Married, accompanied by her husband who has retinitis pigmentosa.  Past GI procedures: Colonoscopy 09/2017- ([facility]. Past Medical History:  Diagnosis Date  . Allergic rhinitis   . Anxiety   . Arthritis   . Family history of colonic polyps   . Fibromyalgia   . History of colonic polyps   . Hypercholesterolemia   . Mitral valve prolapse   . Ocular migraine   . Palpitations     Past Surgical History:  Procedure Laterality Date  . COLONOSCOPY  10/05/2017   Mild sigmoid diverticulosis. Otherwise normal colonoscopy.     Family History  Problem Relation Age of Onset  . Coronary artery disease Other   . Heart disease Mother   . Heart disease Father   . Colon polyps Sister   . Other Sister        low hemoglobin  . Colon cancer Neg Hx     Social History   Tobacco Use  . Smoking status: Never Smoker  . Smokeless tobacco: Never Used  Substance Use Topics  . Alcohol use: Yes    Comment: ocassionally   . Drug use: No    Current Outpatient Medications  Medication Sig Dispense Refill  . Cholecalciferol (VITAMIN D3 PO) Take 1 tablet by mouth daily. Take 145m dailt    . citalopram (CELEXA) 10 MG tablet Take 10 mg by mouth daily.    . Cyanocobalamin (VITAMIN B-12 IJ) Inject as directed every 30 (thirty) days.    .Marland Kitchen  ferrous sulfate 325 (65 FE) MG tablet Take 1 tablet by mouth daily.    Marland Kitchen gabapentin (NEURONTIN) 300 MG capsule Take 300 mg by mouth 2 (two) times daily.    Marland Kitchen losartan (COZAAR) 25 MG tablet Take 25 mg by mouth daily.    . metoprolol succinate (TOPROL-XL) 25 MG 24 hr tablet Take 12.5 mg by mouth daily.    . pantoprazole (PROTONIX) 40 MG tablet Take 40 mg by mouth daily.    . pravastatin (PRAVACHOL) 40 MG tablet Take 40 mg by mouth daily.    Marland Kitchen spironolactone (ALDACTONE) 25 MG tablet 1 tablet daily.     No current facility-administered medications for this visit.      Allergies  Allergen Reactions  . Duloxetine Other (See Comments)    Violent tremors   . Topiramate Shortness Of Breath  . Benadryl [Diphenhydramine Hcl]   . Cephalexin   . Penicillins   . Sulfa Antibiotics     Review of Systems:  Constitutional: Denies fever, chills, diaphoresis, appetite change and fatigue.  HEENT: Denies photophobia, eye pain, redness, hearing loss, ear pain, congestion, sore throat, rhinorrhea, sneezing, mouth sores, neck pain, neck stiffness and tinnitus.   Respiratory: Denies SOB, DOE, cough, chest tightness,  and wheezing.   Cardiovascular: Denies chest pain, palpitations and leg swelling.  Genitourinary: Denies dysuria, urgency, frequency, hematuria, flank pain and difficulty urinating.  Musculoskeletal: Denies myalgias, back pain, joint swelling, arthralgias and gait problem.  Skin: No rash.  Neurological: Denies dizziness, seizures, syncope, weakness, light-headedness, numbness and headaches.  Hematological: Denies adenopathy. Easy bruising, personal or family bleeding history  Psychiatric/Behavioral: No anxiety or depression     Physical Exam:    BP 124/76   Pulse (!) 56   Temp 98.5 F (36.9 C)   Ht 5' 1" (1.549 m)   Wt 193 lb 6 oz (87.7 kg)   BMI 36.54 kg/m  Filed Weights   09/15/19 0836  Weight: 193 lb 6 oz (87.7 kg)   Constitutional:  Well-developed, in no acute distress. Psychiatric: Normal mood and affect. Behavior is normal. HEENT: Pupils normal.  Conjunctivae are normal. No scleral icterus. Neck supple.  Cardiovascular: Normal rate, regular rhythm. No edema Pulmonary/chest: Effort normal and breath sounds normal. No wheezing, rales or rhonchi. Abdominal: Soft, nondistended.  Mild epigastric tenderness.  Bowel sounds active throughout. There are no masses palpable. No hepatomegaly. Rectal:  defered Neurological: Alert and oriented to person place and time. Skin: Skin is warm and dry. No rashes noted.  Data Reviewed: I have  personally reviewed following labs and imaging studies CBC 08/14/2019 hemoglobin 13.1, MCV 91, platelets 149. CMP glucose 161, BUN 18, creatinine 0.6, normal LFTs AST 29/ALT 34 alk phos 68.   Carmell Austria, MD 09/15/2019, 8:55 AM  Cc: Dr. Marco Collie.

## 2019-09-15 NOTE — Patient Instructions (Signed)
If you are age 67 or older, your body mass index should be between 23-30. Your Body mass index is 36.54 kg/m. If this is out of the aforementioned range listed, please consider follow up with your Primary Care Provider.  If you are age 38 or younger, your body mass index should be between 19-25. Your Body mass index is 36.54 kg/m. If this is out of the aformentioned range listed, please consider follow up with your Primary Care Provider.   You have been scheduled for an endoscopy and colonoscopy. Please follow the written instructions given to you at your visit today. Please pick up your prep supplies at the pharmacy within the next 1-3 days. If you use inhalers (even only as needed), please bring them with you on the day of your procedure. Your physician has requested that you go to www.startemmi.com and enter the access code given to you at your visit today. This web site gives a general overview about your procedure. However, you should still follow specific instructions given to you by our office regarding your preparation for the procedure.  Due to recent COVID-19 restrictions implemented by [employer] and state authorities and in an effort to keep both patients and staff as safe as possible, [place] requires COVID-19 testing prior to any scheduled endoscopic procedure. The testing center is located at [address] [place], [place] [address] in the [facility] [employer]  suite.  Your appointment has been scheduled for 10/21/19 at 10am.   Please bring your insurance cards to this appointment. You will require your COVID screen 2 business days prior to your endoscopic procedure.  You are not required to quarantine after your screening.  You will only receive a phone call with the results if it is POSITIVE.  If you do not receive a call the day before your procedure you should begin your prep, if ordered, and you should report to the endo  center for your procedure at your designated appointment arrival time ( one hour prior to the procedure time). There is no cost to you for the screening on the day of the swab.  [facility] will file with your insurance company for the testing.    You may receive an automated phone call prior to your procedure or have a message in your MyChart that you have an appointment for a BP/15 at the [facility], please disregard this message.  Your testing will be at the [address] [place] location.   If you are leaving [facility] travel [place] on [address], turn left onto [facility] [address], turn night onto [address] at the 1st stop light turn right, pass the [employer] on your right and proceed to [place] (white building).   We have sent the following medications to your pharmacy for you to pick up at your convenience: Protonix  Suprep  Please go to the lab at [facility] ([address] You will need to go to level "B", you do not need an appointment for this. Hours available are 7:30 am - 4:30 pm.   Hold Iron x 3 days before procedure.   Thank you,  Dr. Jackquline Denmark

## 2019-09-16 DIAGNOSIS — R5383 Other fatigue: Secondary | ICD-10-CM | POA: Diagnosis not present

## 2019-09-22 ENCOUNTER — Telehealth: Payer: Self-pay | Admitting: Gastroenterology

## 2019-09-22 DIAGNOSIS — K219 Gastro-esophageal reflux disease without esophagitis: Secondary | ICD-10-CM

## 2019-09-22 DIAGNOSIS — R197 Diarrhea, unspecified: Secondary | ICD-10-CM

## 2019-09-22 DIAGNOSIS — D509 Iron deficiency anemia, unspecified: Secondary | ICD-10-CM

## 2019-09-22 NOTE — Telephone Encounter (Signed)
Patient was seen in office on 09/15/19. I have called patient back and she said she feels like the Protonix isn't working, she is complaining of feeling a dull sore pain in the RLQ and is still having loose stool and is belching a lot. Please advise.

## 2019-09-23 DIAGNOSIS — G43109 Migraine with aura, not intractable, without status migrainosus: Secondary | ICD-10-CM | POA: Diagnosis not present

## 2019-09-23 DIAGNOSIS — R7989 Other specified abnormal findings of blood chemistry: Secondary | ICD-10-CM | POA: Diagnosis not present

## 2019-09-23 DIAGNOSIS — K2100 Gastro-esophageal reflux disease with esophagitis, without bleeding: Secondary | ICD-10-CM | POA: Diagnosis not present

## 2019-09-23 DIAGNOSIS — G43B0 Ophthalmoplegic migraine, not intractable: Secondary | ICD-10-CM | POA: Diagnosis not present

## 2019-09-23 DIAGNOSIS — Z0000 Encounter for general adult medical examination without abnormal findings: Secondary | ICD-10-CM | POA: Diagnosis not present

## 2019-09-23 DIAGNOSIS — Z83518 Family history of other specified eye disorder: Secondary | ICD-10-CM | POA: Diagnosis not present

## 2019-09-23 DIAGNOSIS — Z09 Encounter for follow-up examination after completed treatment for conditions other than malignant neoplasm: Secondary | ICD-10-CM | POA: Diagnosis not present

## 2019-09-23 DIAGNOSIS — E611 Iron deficiency: Secondary | ICD-10-CM | POA: Diagnosis not present

## 2019-09-23 DIAGNOSIS — R69 Illness, unspecified: Secondary | ICD-10-CM | POA: Diagnosis not present

## 2019-09-23 DIAGNOSIS — E538 Deficiency of other specified B group vitamins: Secondary | ICD-10-CM | POA: Diagnosis not present

## 2019-09-23 DIAGNOSIS — Z9849 Cataract extraction status, unspecified eye: Secondary | ICD-10-CM | POA: Diagnosis not present

## 2019-09-26 NOTE — Telephone Encounter (Signed)
Since she is having diarrhea, check Stool studies for GI Pathogen (includes C. Diff), WBCs, culture,O&P, fecal elastase and Calprotectin. Proceed with EGD/colonoscopy as per last note Check CBC, CMP and lipase FI in 6 weeks.  RG

## 2019-09-27 NOTE — Telephone Encounter (Signed)
I have spoke to patient and instructed her to have labs done, patient voiced understanding.

## 2019-09-27 NOTE — Telephone Encounter (Signed)
I have put orders in Epic and have called and left patient a message to return my call.

## 2019-10-03 DIAGNOSIS — R7989 Other specified abnormal findings of blood chemistry: Secondary | ICD-10-CM | POA: Insufficient documentation

## 2019-10-03 DIAGNOSIS — G43109 Migraine with aura, not intractable, without status migrainosus: Secondary | ICD-10-CM | POA: Insufficient documentation

## 2019-10-03 DIAGNOSIS — E611 Iron deficiency: Secondary | ICD-10-CM | POA: Insufficient documentation

## 2019-10-07 ENCOUNTER — Other Ambulatory Visit (INDEPENDENT_AMBULATORY_CARE_PROVIDER_SITE_OTHER): Payer: Self-pay

## 2019-10-07 DIAGNOSIS — D509 Iron deficiency anemia, unspecified: Secondary | ICD-10-CM

## 2019-10-07 DIAGNOSIS — K219 Gastro-esophageal reflux disease without esophagitis: Secondary | ICD-10-CM

## 2019-10-07 DIAGNOSIS — R935 Abnormal findings on diagnostic imaging of other abdominal regions, including retroperitoneum: Secondary | ICD-10-CM

## 2019-10-07 LAB — LIPASE: Lipase: 58 U/L (ref 11.0–59.0)

## 2019-10-07 LAB — COMPREHENSIVE METABOLIC PANEL
ALT: 22 U/L (ref 0–35)
AST: 16 U/L (ref 0–37)
Albumin: 4.5 g/dL (ref 3.5–5.2)
Alkaline Phosphatase: 69 U/L (ref 39–117)
BUN: 16 mg/dL (ref 6–23)
CO2: 29 mEq/L (ref 19–32)
Calcium: 9.5 mg/dL (ref 8.4–10.5)
Chloride: 100 mEq/L (ref 96–112)
Creatinine, Ser: 0.81 mg/dL (ref 0.40–1.20)
GFR: 70.34 mL/min (ref >60.00)
Glucose, Bld: 95 mg/dL (ref 70–99)
Potassium: 4.6 mEq/L (ref 3.5–5.1)
Sodium: 138 mEq/L (ref 135–145)
Total Bilirubin: 0.6 mg/dL (ref 0.2–1.2)
Total Protein: 7.4 g/dL (ref 6.0–8.3)

## 2019-10-07 LAB — CBC WITH DIFFERENTIAL/PLATELET
Basophils Absolute: 0 10*3/uL (ref 0.0–0.1)
Basophils Relative: 0.6 % (ref 0.0–3.0)
Eosinophils Absolute: 0.1 10*3/uL (ref 0.0–0.7)
Eosinophils Relative: 1.9 % (ref 0.0–5.0)
HCT: 39.8 % (ref 36.0–46.0)
Hemoglobin: 13.2 g/dL (ref 12.0–15.0)
Lymphocytes Relative: 38.1 % (ref 12.0–46.0)
Lymphs Abs: 2.1 10*3/uL (ref 0.7–4.0)
MCHC: 33.3 g/dL (ref 30.0–36.0)
MCV: 92.2 fl (ref 78.0–100.0)
Monocytes Absolute: 0.5 10*3/uL (ref 0.1–1.0)
Monocytes Relative: 8.9 % (ref 3.0–12.0)
Neutro Abs: 2.7 10*3/uL (ref 1.4–7.7)
Neutrophils Relative %: 50.5 % (ref 43.0–77.0)
Platelets: 216 10*3/uL (ref 150.0–400.0)
RBC: 4.31 Mil/uL (ref 3.87–5.11)
RDW: 13.2 % (ref 11.5–15.5)
WBC: 5.4 10*3/uL (ref 4.0–10.5)

## 2019-10-10 ENCOUNTER — Telehealth: Payer: Self-pay | Admitting: Gastroenterology

## 2019-10-10 ENCOUNTER — Other Ambulatory Visit: Payer: Self-pay

## 2019-10-10 DIAGNOSIS — R197 Diarrhea, unspecified: Secondary | ICD-10-CM

## 2019-10-10 DIAGNOSIS — K219 Gastro-esophageal reflux disease without esophagitis: Secondary | ICD-10-CM | POA: Diagnosis not present

## 2019-10-10 DIAGNOSIS — D509 Iron deficiency anemia, unspecified: Secondary | ICD-10-CM | POA: Diagnosis not present

## 2019-10-10 NOTE — Telephone Encounter (Signed)
Please see additional documentation concerning this patient 

## 2019-10-12 ENCOUNTER — Encounter: Payer: Self-pay | Admitting: Gastroenterology

## 2019-10-17 ENCOUNTER — Other Ambulatory Visit: Payer: Self-pay | Admitting: General Practice

## 2019-10-17 DIAGNOSIS — Z1231 Encounter for screening mammogram for malignant neoplasm of breast: Secondary | ICD-10-CM

## 2019-10-17 LAB — CALPROTECTIN: Calprotectin: 118 mcg/g

## 2019-10-18 LAB — GASTROINTESTINAL PATHOGEN PANEL PCR
C. difficile Tox A/B, PCR: NOT DETECTED
Campylobacter, PCR: NOT DETECTED
Cryptosporidium, PCR: NOT DETECTED
E coli (ETEC) LT/ST PCR: NOT DETECTED
E coli (STEC) stx1/stx2, PCR: NOT DETECTED
E coli 0157, PCR: NOT DETECTED
Giardia lamblia, PCR: NOT DETECTED
Norovirus, PCR: NOT DETECTED
Rotavirus A, PCR: NOT DETECTED
Salmonella, PCR: NOT DETECTED
Shigella, PCR: NOT DETECTED

## 2019-10-18 LAB — STOOL CULTURE
MICRO NUMBER:: 1129449
MICRO NUMBER:: 1129450
MICRO NUMBER:: 1129452
SHIGA RESULT:: NOT DETECTED
SPECIMEN QUALITY:: ADEQUATE
SPECIMEN QUALITY:: ADEQUATE
SPECIMEN QUALITY:: ADEQUATE

## 2019-10-18 LAB — OVA AND PARASITE EXAMINATION
CONCENTRATE RESULT:: NONE SEEN
MICRO NUMBER:: 1129451
SPECIMEN QUALITY:: ADEQUATE
TRICHROME RESULT:: NONE SEEN

## 2019-10-18 LAB — PANCREATIC ELASTASE, FECAL: Pancreatic Elastase-1, Stool: >500 mcg/g

## 2019-10-24 ENCOUNTER — Ambulatory Visit (INDEPENDENT_AMBULATORY_CARE_PROVIDER_SITE_OTHER): Payer: Medicare HMO

## 2019-10-24 ENCOUNTER — Other Ambulatory Visit: Payer: Self-pay | Admitting: Gastroenterology

## 2019-10-24 ENCOUNTER — Encounter: Payer: Self-pay | Admitting: Gastroenterology

## 2019-10-24 DIAGNOSIS — Z1159 Encounter for screening for other viral diseases: Secondary | ICD-10-CM

## 2019-10-25 LAB — SARS CORONAVIRUS 2 (TAT 6-24 HRS): SARS Coronavirus 2: NEGATIVE

## 2019-10-26 ENCOUNTER — Ambulatory Visit (AMBULATORY_SURGERY_CENTER): Payer: Medicare HMO | Admitting: Gastroenterology

## 2019-10-26 ENCOUNTER — Other Ambulatory Visit: Payer: Self-pay

## 2019-10-26 ENCOUNTER — Other Ambulatory Visit: Payer: Self-pay | Admitting: *Deleted

## 2019-10-26 ENCOUNTER — Other Ambulatory Visit (INDEPENDENT_AMBULATORY_CARE_PROVIDER_SITE_OTHER): Payer: Medicare HMO

## 2019-10-26 ENCOUNTER — Encounter: Payer: Self-pay | Admitting: Gastroenterology

## 2019-10-26 VITALS — BP 115/83 | HR 108 | Temp 98.2°F | Resp 24 | Ht 61.0 in | Wt 193.0 lb

## 2019-10-26 DIAGNOSIS — D509 Iron deficiency anemia, unspecified: Secondary | ICD-10-CM | POA: Diagnosis not present

## 2019-10-26 DIAGNOSIS — R197 Diarrhea, unspecified: Secondary | ICD-10-CM

## 2019-10-26 DIAGNOSIS — K5730 Diverticulosis of large intestine without perforation or abscess without bleeding: Secondary | ICD-10-CM

## 2019-10-26 DIAGNOSIS — D120 Benign neoplasm of cecum: Secondary | ICD-10-CM | POA: Diagnosis not present

## 2019-10-26 DIAGNOSIS — K648 Other hemorrhoids: Secondary | ICD-10-CM | POA: Diagnosis not present

## 2019-10-26 DIAGNOSIS — K635 Polyp of colon: Secondary | ICD-10-CM | POA: Diagnosis not present

## 2019-10-26 DIAGNOSIS — K2950 Unspecified chronic gastritis without bleeding: Secondary | ICD-10-CM

## 2019-10-26 DIAGNOSIS — K219 Gastro-esophageal reflux disease without esophagitis: Secondary | ICD-10-CM | POA: Diagnosis not present

## 2019-10-26 DIAGNOSIS — K2970 Gastritis, unspecified, without bleeding: Secondary | ICD-10-CM

## 2019-10-26 DIAGNOSIS — I10 Essential (primary) hypertension: Secondary | ICD-10-CM | POA: Diagnosis not present

## 2019-10-26 DIAGNOSIS — R933 Abnormal findings on diagnostic imaging of other parts of digestive tract: Secondary | ICD-10-CM | POA: Diagnosis not present

## 2019-10-26 LAB — COMPREHENSIVE METABOLIC PANEL
ALT: 19 U/L (ref 0–35)
AST: 17 U/L (ref 0–37)
Albumin: 4.4 g/dL (ref 3.5–5.2)
Alkaline Phosphatase: 64 U/L (ref 39–117)
BUN: 11 mg/dL (ref 6–23)
CO2: 26 mEq/L (ref 19–32)
Calcium: 8.9 mg/dL (ref 8.4–10.5)
Chloride: 106 mEq/L (ref 96–112)
Creatinine, Ser: 0.69 mg/dL (ref 0.40–1.20)
GFR: 84.63 mL/min (ref >60.00)
Glucose, Bld: 103 mg/dL — ABNORMAL HIGH (ref 70–99)
Potassium: 3.8 mEq/L (ref 3.5–5.1)
Sodium: 141 mEq/L (ref 135–145)
Total Bilirubin: 0.4 mg/dL (ref 0.2–1.2)
Total Protein: 7.2 g/dL (ref 6.0–8.3)

## 2019-10-26 LAB — CBC WITH DIFFERENTIAL/PLATELET
Basophils Absolute: 0 10*3/uL (ref 0.0–0.1)
Basophils Relative: 1 % (ref 0.0–3.0)
Eosinophils Absolute: 0.1 10*3/uL (ref 0.0–0.7)
Eosinophils Relative: 1.1 % (ref 0.0–5.0)
HCT: 37.8 % (ref 36.0–46.0)
Hemoglobin: 12.3 g/dL (ref 12.0–15.0)
Lymphocytes Relative: 31.6 % (ref 12.0–46.0)
Lymphs Abs: 1.5 10*3/uL (ref 0.7–4.0)
MCHC: 32.6 g/dL (ref 30.0–36.0)
MCV: 93 fl (ref 78.0–100.0)
Monocytes Absolute: 0.4 10*3/uL (ref 0.1–1.0)
Monocytes Relative: 8.1 % (ref 3.0–12.0)
Neutro Abs: 2.7 10*3/uL (ref 1.4–7.7)
Neutrophils Relative %: 58.2 % (ref 43.0–77.0)
Platelets: 225 10*3/uL (ref 150.0–400.0)
RBC: 4.06 Mil/uL (ref 3.87–5.11)
RDW: 13.4 % (ref 11.5–15.5)
WBC: 4.6 10*3/uL (ref 4.0–10.5)

## 2019-10-26 MED ORDER — SODIUM CHLORIDE 0.9 % IV SOLN: 500.0000 mL | Freq: Once | INTRAVENOUS | Status: DC

## 2019-10-26 NOTE — Op Note (Signed)
Leslie Griffin Patient Name: Leslie Griffin Procedure Date: 10/26/2019 9:56 AM MRN: [id number] Endoscopist: Jackquline Denmark , MD Age: 68 Referring MD:  Date of Birth: 10/21/1951 Gender: Female Account #: [id number] Procedure:                Upper GI endoscopy Indications:              Epigastric abdominal pain, Abn CT, iron deficiency                            without anemia. ? Melena. Medicines:                Monitored Anesthesia Care Procedure:                Pre-Anesthesia Assessment:                           - Prior to the procedure, a History and Physical                            was performed, and patient medications and                            allergies were reviewed. The patient's tolerance of                            previous anesthesia was also reviewed. The risks                            and benefits of the procedure and the sedation                            options and risks were discussed with the patient.                            All questions were answered, and informed consent                            was obtained. Prior Anticoagulants: The patient has                            taken no previous anticoagulant or antiplatelet                            agents. ASA Grade Assessment: II - A patient with                            mild systemic disease. After reviewing the risks                            and benefits, the patient was deemed in                            satisfactory condition to undergo the procedure.  After obtaining informed consent, the endoscope was                            passed under direct vision. Throughout the                            procedure, the patient's blood pressure, pulse, and                            oxygen saturations were monitored continuously. The                            Endoscope was introduced through the mouth, and                            advanced to the second part of  duodenum. The upper                            GI endoscopy was accomplished without difficulty.                            The patient tolerated the procedure well. Scope In: Scope Out: Findings:                 The examined esophagus was normal. Z-line was                            well-defined at 34 cm. Examined by NBI.                           Localized mild inflammation characterized by                            erythema was found in the gastric antrum. Biopsies                            were taken with a cold forceps from antrum and body                            of the stomach. A incidental 6 mm sessile yellowish                            submucosal lesion (likely a small lipoma) was noted                            in the antrum with normal overlying mucosa.                           The examined duodenum was normal. Biopsies for                            histology were taken with a cold forceps for  evaluation of celiac disease. Complications:            No immediate complications. Estimated Blood Loss:     Estimated blood loss: none. Impression:               -Mild gastritis.                           -Otherwise normal EGD. Recommendation:           - Patient has a contact number available for                            emergencies. The signs and symptoms of potential                            delayed complications were discussed with the                            patient. Return to normal activities tomorrow.                            Written discharge instructions were provided to the                            patient.                           - Resume previous diet.                           - Continue present medications.                           - Await pathology results.                           - Proceed with colonoscopy. Jackquline Denmark, MD 10/26/2019 10:49:24 AM This report has been signed electronically.

## 2019-10-26 NOTE — Progress Notes (Signed)
Called to room to assist during endoscopic procedure.  Patient ID and intended procedure confirmed with present staff. Received instructions for my participation in the procedure from the performing physician.  

## 2019-10-26 NOTE — Op Note (Signed)
[place] Patient Name: Leslie Griffin Procedure Date: 10/26/2019 9:57 AM MRN: [id number] Endoscopist: Jackquline Denmark , MD Age: 68 Referring MD:  Date of Birth: 1951-08-29 Gender: Female Account #: [id number] Procedure:                Colonoscopy Indications:              Clinically significant diarrhea of unexplained                            origin Medicines:                Monitored Anesthesia Care Procedure:                Pre-Anesthesia Assessment:                           - Prior to the procedure, a History and Physical                            was performed, and patient medications and                            allergies were reviewed. The patient's tolerance of                            previous anesthesia was also reviewed. The risks                            and benefits of the procedure and the sedation                            options and risks were discussed with the patient.                            All questions were answered, and informed consent                            was obtained. Prior Anticoagulants: The patient has                            taken no previous anticoagulant or antiplatelet                            agents. ASA Grade Assessment: II - A patient with                            mild systemic disease. After reviewing the risks                            and benefits, the patient was deemed in                            satisfactory condition to undergo the procedure.                           -  Prior to the procedure, a History and Physical                            was performed, and patient medications and                            allergies were reviewed. The patient's tolerance of                            previous anesthesia was also reviewed. The risks                            and benefits of the procedure and the sedation                            options and risks were discussed with the patient.                             All questions were answered, and informed consent                            was obtained. Prior Anticoagulants: The patient has                            taken no previous anticoagulant or antiplatelet                            agents. ASA Grade Assessment: II - A patient with                            mild systemic disease. After reviewing the risks                            and benefits, the patient was deemed in                            satisfactory condition to undergo the procedure.                           After obtaining informed consent, the colonoscope                            was passed under direct vision. Throughout the                            procedure, the patient's blood pressure, pulse, and                            oxygen saturations were monitored continuously.The                            colonoscopy was performed without difficulty. The  patient tolerated the procedure well. The                            Colonoscope was introduced through the and advanced                            to the 2 cm into the ileum. The quality of the                            bowel preparation was good. The terminal ileum,                            ileocecal valve, appendiceal orifice, and rectum                            were photographed. Colon was highly redundant and                            somewhat difficult to examine. Scope In: 10:20:24 AM Scope Out: 10:43:26 AM Scope Withdrawal Time: 0 hours 14 minutes 57 seconds  Total Procedure Duration: 0 hours 23 minutes 2 seconds  Findings:                 The colon (entire examined portion) appeared                            normal. Biopsies for histology were taken with a                            cold forceps for evaluation of microscopic colitis.                           A 2 mm polyp was found in the cecum. The polyp was                            sessile. The polyp was removed with a  cold biopsy                            forceps. Resection and retrieval were complete.                            Estimated blood loss: none.                           Multiple medium-mouthed diverticula were found in                            the sigmoid colon and few in descending colon.                           Non-bleeding internal hemorrhoids were found during                            retroflexion. The hemorrhoids were small.  The terminal ileum appeared normal.                           The exam was otherwise without abnormality on                            direct and retroflexion views. Complications:            No immediate complications. Estimated Blood Loss:     Estimated blood loss: none. Impression:               -Diminutive colonic polyp s/p polypectomy.                           -Moderate predominantly sigmoid diverticulosis.                           -Non-bleeding internal hemorrhoids.                           -Otherwise normal colonoscopy to TI. The colon was                            highly redundant. Recommendation:           - Patient has a contact number available for                            emergencies. The signs and symptoms of potential                            delayed complications were discussed with the                            patient. Return to normal activities tomorrow.                            Written discharge instructions were provided to the                            patient.                           - Resume previous diet.                           - Continue present medications.                           - Await pathology results.                           - Repeat colonoscopy for surveillance based on                            pathology results.                           - Return to GI  office in 12 weeks.                           - Check CBC, CMP today as planned. Jackquline Denmark, MD 10/26/2019 10:54:36  AM This report has been signed electronically.

## 2019-10-26 NOTE — Patient Instructions (Addendum)
Read all of the handouts given to you by your recovery room nurse.  Thank-you for choosing [place] for your healthcare needs today. We will take you to the lab for blood work before you go home today.  YOU HAD AN ENDOSCOPIC PROCEDURE TODAY AT [facility]:   Refer to the procedure report that was given to you for any specific questions about what was found during the examination.  If the procedure report does not answer your questions, please call your gastroenterologist to clarify.  If you requested that your care partner not be given the details of your procedure findings, then the procedure report has been included in a sealed envelope for you to review at your convenience later.  YOU SHOULD EXPECT: Some feelings of bloating in the abdomen. Passage of more gas than usual.  Walking can help get rid of the air that was put into your GI tract during the procedure and reduce the bloating. If you had a lower endoscopy (such as a colonoscopy or flexible sigmoidoscopy) you may notice spotting of blood in your stool or on the toilet paper. If you underwent a bowel prep for your procedure, you may not have a normal bowel movement for a few days.  Please Note:  You might notice some irritation and congestion in your nose or some drainage.  This is from the oxygen used during your procedure.  There is no need for concern and it should clear up in a day or so.  SYMPTOMS TO REPORT IMMEDIATELY:   Following lower endoscopy (colonoscopy or flexible sigmoidoscopy):  Excessive amounts of blood in the stool  Significant tenderness or worsening of abdominal pains  Swelling of the abdomen that is new, acute  Fever of 100F or higher   Following upper endoscopy (EGD)  Vomiting of blood or coffee ground material  New chest pain or pain under the shoulder blades  Painful or persistently difficult swallowing  New shortness of breath  Fever of 100F or higher  Black, tarry-looking stools  For urgent  or emergent issues, a gastroenterologist can be reached at any hour by calling [phone].   DIET:  We do recommend a small meal at first, but then you may proceed to your regular diet.  Drink plenty of fluids but you should avoid alcoholic beverages for 24 hours.  ACTIVITY:  You should plan to take it easy for the rest of today and you should NOT DRIVE or use heavy machinery until tomorrow (because of the sedation medicines used during the test).    FOLLOW UP: Our staff will call the number listed on your records 48-72 hours following your procedure to check on you and address any questions or concerns that you may have regarding the information given to you following your procedure. If we do not reach you, we will leave a message.  We will attempt to reach you two times.  During this call, we will ask if you have developed any symptoms of COVID 19. If you develop any symptoms (ie: fever, flu-like symptoms, shortness of breath, cough etc.) before then, please call [phone].  If you test positive for Covid 19 in the 2 weeks post procedure, please call and report this information to [place].    If any biopsies were taken you will be contacted by phone or by letter within the next 1-3 weeks.  Please call us at [phone] if you have not heard about the biopsies in 3 weeks.    SIGNATURES/CONFIDENTIALITY:  You and/or your care partner have signed paperwork which will be entered into your electronic medical record.  These signatures attest to the fact that that the information above on your After Visit Summary has been reviewed and is understood.  Full responsibility of the confidentiality of this discharge information lies with you and/or your care-partner.

## 2019-10-26 NOTE — Progress Notes (Signed)
Report given to PACU, vss 

## 2019-10-26 NOTE — Progress Notes (Signed)
Temp taken by [facility] VS taken by DT

## 2019-10-28 ENCOUNTER — Telehealth: Payer: Self-pay

## 2019-10-28 ENCOUNTER — Telehealth: Payer: Self-pay | Admitting: Gastroenterology

## 2019-10-28 NOTE — Telephone Encounter (Signed)
Left message for patient to call back to the office;  

## 2019-10-28 NOTE — Telephone Encounter (Signed)
Called and spoke with patient-patient requests that her COVID results be sent to [facility] so she can reschedule her mammography test; results have been printed-will be mailed to patient and also faxed to [facility] at [phone];  Patient advised to call back to the office at [phone] should questions/concerns arise;  Patient verbalized understanding of information/instructions;

## 2019-10-28 NOTE — Telephone Encounter (Signed)
  Follow up Call-  Call back number 10/26/2019  Post procedure Call Back phone  # [phone]  Permission to leave phone message Yes  Some recent data might be hidden     Patient questions:  Do you have a fever, pain , or abdominal swelling? No. Pain Score  0 *  Have you tolerated food without any problems? Yes.    Have you been able to return to your normal activities? Yes.    Do you have any questions about your discharge instructions: Diet   No. Medications  No. Follow up visit  No.  Do you have questions or concerns about your Care? Yes.    Felt she had a lot of anesthesia and had a hard time getting over it  Actions: * If pain score is 4 or above: No action needed, pain <4.  1. Have you developed a fever since your procedure? no  2.   Have you had an respiratory symptoms (SOB or cough) since your procedure? no  3.   Have you tested positive for COVID 19 since your procedure no  4.   Have you had any family members/close contacts diagnosed with the COVID 19 since your procedure?  no   If yes to any of these questions please route to Joylene John, RN and Alphonsa Gin, Therapist, sports.

## 2019-10-28 NOTE — Telephone Encounter (Signed)
Pt needs results of Covid test mailed to her address. She states that she had to r/s a test at [facility] because they want a copy of her Covid results.

## 2019-10-28 NOTE — Telephone Encounter (Signed)
Pt called returning your call 

## 2019-11-03 ENCOUNTER — Encounter: Payer: Self-pay | Admitting: Gastroenterology

## 2019-11-14 ENCOUNTER — Telehealth: Payer: Self-pay | Admitting: Gastroenterology

## 2019-11-14 DIAGNOSIS — E538 Deficiency of other specified B group vitamins: Secondary | ICD-10-CM | POA: Diagnosis not present

## 2019-11-14 DIAGNOSIS — H8113 Benign paroxysmal vertigo, bilateral: Secondary | ICD-10-CM | POA: Diagnosis not present

## 2019-11-14 DIAGNOSIS — Z0000 Encounter for general adult medical examination without abnormal findings: Secondary | ICD-10-CM | POA: Diagnosis not present

## 2019-11-14 NOTE — Telephone Encounter (Signed)
Pt called inquiring about path results. I see that results were already given to pt. However, pt stated that the results from the biopsy from her stomach was still pending.

## 2019-11-14 NOTE — Telephone Encounter (Signed)
Reviewed path letter with patient. Answered all questions. No other concerns voiced by the conclusion of the call.

## 2020-02-16 DIAGNOSIS — R5382 Chronic fatigue, unspecified: Secondary | ICD-10-CM | POA: Diagnosis not present

## 2020-02-16 DIAGNOSIS — R197 Diarrhea, unspecified: Secondary | ICD-10-CM | POA: Diagnosis not present

## 2020-02-16 DIAGNOSIS — R1084 Generalized abdominal pain: Secondary | ICD-10-CM | POA: Diagnosis not present

## 2020-02-27 NOTE — Telephone Encounter (Signed)
Patient returned call to the office and will be scheduled for a ROV by [facility];

## 2020-02-27 NOTE — Telephone Encounter (Signed)
Left message for patient to call back to the office;  

## 2020-02-29 DIAGNOSIS — E785 Hyperlipidemia, unspecified: Secondary | ICD-10-CM | POA: Diagnosis not present

## 2020-02-29 DIAGNOSIS — Z6837 Body mass index (BMI) 37.0-37.9, adult: Secondary | ICD-10-CM | POA: Diagnosis not present

## 2020-02-29 DIAGNOSIS — G8929 Other chronic pain: Secondary | ICD-10-CM | POA: Diagnosis not present

## 2020-02-29 DIAGNOSIS — E669 Obesity, unspecified: Secondary | ICD-10-CM | POA: Diagnosis not present

## 2020-02-29 DIAGNOSIS — I10 Essential (primary) hypertension: Secondary | ICD-10-CM | POA: Diagnosis not present

## 2020-02-29 DIAGNOSIS — M797 Fibromyalgia: Secondary | ICD-10-CM | POA: Diagnosis not present

## 2020-02-29 DIAGNOSIS — R69 Illness, unspecified: Secondary | ICD-10-CM | POA: Diagnosis not present

## 2020-02-29 DIAGNOSIS — Z008 Encounter for other general examination: Secondary | ICD-10-CM | POA: Diagnosis not present

## 2020-03-13 DIAGNOSIS — M542 Cervicalgia: Secondary | ICD-10-CM | POA: Diagnosis not present

## 2020-03-13 DIAGNOSIS — M5441 Lumbago with sciatica, right side: Secondary | ICD-10-CM | POA: Diagnosis not present

## 2020-03-13 DIAGNOSIS — R1084 Generalized abdominal pain: Secondary | ICD-10-CM | POA: Insufficient documentation

## 2020-03-13 DIAGNOSIS — E538 Deficiency of other specified B group vitamins: Secondary | ICD-10-CM | POA: Diagnosis not present

## 2020-03-13 DIAGNOSIS — M47812 Spondylosis without myelopathy or radiculopathy, cervical region: Secondary | ICD-10-CM | POA: Diagnosis not present

## 2020-03-13 DIAGNOSIS — G8929 Other chronic pain: Secondary | ICD-10-CM | POA: Diagnosis not present

## 2020-03-13 DIAGNOSIS — M545 Low back pain: Secondary | ICD-10-CM | POA: Diagnosis not present

## 2020-03-20 ENCOUNTER — Ambulatory Visit: Payer: Medicare HMO | Admitting: Gastroenterology

## 2020-03-22 DIAGNOSIS — E538 Deficiency of other specified B group vitamins: Secondary | ICD-10-CM | POA: Diagnosis not present

## 2020-03-26 DIAGNOSIS — S61210A Laceration without foreign body of right index finger without damage to nail, initial encounter: Secondary | ICD-10-CM | POA: Diagnosis not present

## 2020-04-03 DIAGNOSIS — S61210A Laceration without foreign body of right index finger without damage to nail, initial encounter: Secondary | ICD-10-CM | POA: Insufficient documentation

## 2020-04-03 DIAGNOSIS — G8929 Other chronic pain: Secondary | ICD-10-CM | POA: Diagnosis not present

## 2020-04-03 DIAGNOSIS — Z889 Allergy status to unspecified drugs, medicaments and biological substances status: Secondary | ICD-10-CM | POA: Insufficient documentation

## 2020-04-03 DIAGNOSIS — Z23 Encounter for immunization: Secondary | ICD-10-CM | POA: Diagnosis not present

## 2020-04-03 DIAGNOSIS — W272XXA Contact with scissors, initial encounter: Secondary | ICD-10-CM | POA: Diagnosis not present

## 2020-04-03 DIAGNOSIS — M5442 Lumbago with sciatica, left side: Secondary | ICD-10-CM | POA: Diagnosis not present

## 2020-04-03 DIAGNOSIS — M542 Cervicalgia: Secondary | ICD-10-CM | POA: Diagnosis not present

## 2020-05-01 DIAGNOSIS — M545 Low back pain: Secondary | ICD-10-CM | POA: Diagnosis not present

## 2020-05-26 DIAGNOSIS — M5136 Other intervertebral disc degeneration, lumbar region: Secondary | ICD-10-CM | POA: Diagnosis not present

## 2020-05-26 DIAGNOSIS — M4316 Spondylolisthesis, lumbar region: Secondary | ICD-10-CM | POA: Diagnosis not present

## 2020-05-29 DIAGNOSIS — M4316 Spondylolisthesis, lumbar region: Secondary | ICD-10-CM | POA: Diagnosis not present

## 2020-06-12 DIAGNOSIS — R69 Illness, unspecified: Secondary | ICD-10-CM | POA: Diagnosis not present

## 2020-06-12 DIAGNOSIS — R079 Chest pain, unspecified: Secondary | ICD-10-CM | POA: Diagnosis not present

## 2020-06-12 DIAGNOSIS — I43 Cardiomyopathy in diseases classified elsewhere: Secondary | ICD-10-CM | POA: Diagnosis not present

## 2020-06-12 DIAGNOSIS — E538 Deficiency of other specified B group vitamins: Secondary | ICD-10-CM | POA: Diagnosis not present

## 2020-06-12 DIAGNOSIS — I119 Hypertensive heart disease without heart failure: Secondary | ICD-10-CM | POA: Diagnosis not present

## 2020-06-14 DIAGNOSIS — R079 Chest pain, unspecified: Secondary | ICD-10-CM | POA: Insufficient documentation

## 2020-06-14 DIAGNOSIS — F449 Dissociative and conversion disorder, unspecified: Secondary | ICD-10-CM | POA: Insufficient documentation

## 2020-06-18 DIAGNOSIS — Z136 Encounter for screening for cardiovascular disorders: Secondary | ICD-10-CM | POA: Diagnosis not present

## 2020-06-18 DIAGNOSIS — I6523 Occlusion and stenosis of bilateral carotid arteries: Secondary | ICD-10-CM | POA: Diagnosis not present

## 2020-07-27 DIAGNOSIS — M5416 Radiculopathy, lumbar region: Secondary | ICD-10-CM | POA: Diagnosis not present

## 2020-07-27 DIAGNOSIS — M4316 Spondylolisthesis, lumbar region: Secondary | ICD-10-CM | POA: Diagnosis not present

## 2020-08-13 DIAGNOSIS — N6489 Other specified disorders of breast: Secondary | ICD-10-CM | POA: Diagnosis not present

## 2020-08-13 DIAGNOSIS — N6001 Solitary cyst of right breast: Secondary | ICD-10-CM | POA: Diagnosis not present

## 2020-08-30 DIAGNOSIS — M4316 Spondylolisthesis, lumbar region: Secondary | ICD-10-CM | POA: Diagnosis not present

## 2020-08-31 DIAGNOSIS — Z712 Person consulting for explanation of examination or test findings: Secondary | ICD-10-CM | POA: Diagnosis not present

## 2020-08-31 DIAGNOSIS — M545 Low back pain, unspecified: Secondary | ICD-10-CM | POA: Diagnosis not present

## 2020-08-31 DIAGNOSIS — G8929 Other chronic pain: Secondary | ICD-10-CM | POA: Diagnosis not present

## 2020-09-17 DIAGNOSIS — Z961 Presence of intraocular lens: Secondary | ICD-10-CM | POA: Diagnosis not present

## 2020-09-17 DIAGNOSIS — H43813 Vitreous degeneration, bilateral: Secondary | ICD-10-CM | POA: Diagnosis not present

## 2020-09-20 DIAGNOSIS — H26492 Other secondary cataract, left eye: Secondary | ICD-10-CM | POA: Diagnosis not present

## 2020-09-27 DIAGNOSIS — H26492 Other secondary cataract, left eye: Secondary | ICD-10-CM | POA: Diagnosis not present

## 2020-10-02 DIAGNOSIS — H1012 Acute atopic conjunctivitis, left eye: Secondary | ICD-10-CM | POA: Diagnosis not present

## 2020-10-05 DIAGNOSIS — M5416 Radiculopathy, lumbar region: Secondary | ICD-10-CM | POA: Diagnosis not present

## 2020-10-05 DIAGNOSIS — M4316 Spondylolisthesis, lumbar region: Secondary | ICD-10-CM | POA: Diagnosis not present

## 2020-11-08 DIAGNOSIS — M4316 Spondylolisthesis, lumbar region: Secondary | ICD-10-CM | POA: Diagnosis not present

## 2020-11-12 DIAGNOSIS — M47816 Spondylosis without myelopathy or radiculopathy, lumbar region: Secondary | ICD-10-CM | POA: Insufficient documentation

## 2020-11-12 DIAGNOSIS — M719 Bursopathy, unspecified: Secondary | ICD-10-CM | POA: Diagnosis not present

## 2020-11-12 DIAGNOSIS — M7918 Myalgia, other site: Secondary | ICD-10-CM | POA: Diagnosis not present

## 2020-11-12 DIAGNOSIS — M545 Low back pain, unspecified: Secondary | ICD-10-CM | POA: Diagnosis not present

## 2020-11-12 DIAGNOSIS — G8929 Other chronic pain: Secondary | ICD-10-CM | POA: Diagnosis not present

## 2020-12-13 DIAGNOSIS — M47816 Spondylosis without myelopathy or radiculopathy, lumbar region: Secondary | ICD-10-CM | POA: Diagnosis not present

## 2020-12-13 DIAGNOSIS — R262 Difficulty in walking, not elsewhere classified: Secondary | ICD-10-CM | POA: Diagnosis not present

## 2020-12-13 DIAGNOSIS — G8929 Other chronic pain: Secondary | ICD-10-CM | POA: Diagnosis not present

## 2020-12-13 DIAGNOSIS — M545 Low back pain, unspecified: Secondary | ICD-10-CM | POA: Diagnosis not present

## 2020-12-13 DIAGNOSIS — R29898 Other symptoms and signs involving the musculoskeletal system: Secondary | ICD-10-CM | POA: Diagnosis not present

## 2020-12-28 DIAGNOSIS — F331 Major depressive disorder, recurrent, moderate: Secondary | ICD-10-CM | POA: Diagnosis not present

## 2020-12-28 DIAGNOSIS — Z79899 Other long term (current) drug therapy: Secondary | ICD-10-CM | POA: Diagnosis not present

## 2020-12-28 DIAGNOSIS — H5319 Other subjective visual disturbances: Secondary | ICD-10-CM | POA: Diagnosis not present

## 2020-12-28 DIAGNOSIS — E538 Deficiency of other specified B group vitamins: Secondary | ICD-10-CM | POA: Diagnosis not present

## 2020-12-28 DIAGNOSIS — Z0000 Encounter for general adult medical examination without abnormal findings: Secondary | ICD-10-CM | POA: Diagnosis not present

## 2020-12-28 DIAGNOSIS — I119 Hypertensive heart disease without heart failure: Secondary | ICD-10-CM | POA: Diagnosis not present

## 2020-12-28 DIAGNOSIS — I43 Cardiomyopathy in diseases classified elsewhere: Secondary | ICD-10-CM | POA: Diagnosis not present

## 2020-12-28 DIAGNOSIS — R69 Illness, unspecified: Secondary | ICD-10-CM | POA: Diagnosis not present

## 2020-12-30 DIAGNOSIS — H5319 Other subjective visual disturbances: Secondary | ICD-10-CM | POA: Insufficient documentation

## 2021-01-09 DIAGNOSIS — M545 Low back pain, unspecified: Secondary | ICD-10-CM | POA: Diagnosis not present

## 2021-01-09 DIAGNOSIS — G8929 Other chronic pain: Secondary | ICD-10-CM | POA: Diagnosis not present

## 2021-01-09 DIAGNOSIS — M47816 Spondylosis without myelopathy or radiculopathy, lumbar region: Secondary | ICD-10-CM | POA: Diagnosis not present

## 2021-01-09 DIAGNOSIS — R29898 Other symptoms and signs involving the musculoskeletal system: Secondary | ICD-10-CM | POA: Diagnosis not present

## 2021-01-09 DIAGNOSIS — R262 Difficulty in walking, not elsewhere classified: Secondary | ICD-10-CM | POA: Diagnosis not present

## 2021-01-18 DIAGNOSIS — M47816 Spondylosis without myelopathy or radiculopathy, lumbar region: Secondary | ICD-10-CM | POA: Diagnosis not present

## 2021-01-25 DIAGNOSIS — Z1231 Encounter for screening mammogram for malignant neoplasm of breast: Secondary | ICD-10-CM | POA: Diagnosis not present

## 2021-01-31 DIAGNOSIS — M47816 Spondylosis without myelopathy or radiculopathy, lumbar region: Secondary | ICD-10-CM | POA: Diagnosis not present

## 2021-02-11 DIAGNOSIS — G8929 Other chronic pain: Secondary | ICD-10-CM | POA: Diagnosis not present

## 2021-02-11 DIAGNOSIS — M7918 Myalgia, other site: Secondary | ICD-10-CM | POA: Diagnosis not present

## 2021-02-11 DIAGNOSIS — M47816 Spondylosis without myelopathy or radiculopathy, lumbar region: Secondary | ICD-10-CM | POA: Diagnosis not present

## 2021-02-11 DIAGNOSIS — M545 Low back pain, unspecified: Secondary | ICD-10-CM | POA: Diagnosis not present

## 2021-03-05 DIAGNOSIS — M47816 Spondylosis without myelopathy or radiculopathy, lumbar region: Secondary | ICD-10-CM | POA: Diagnosis not present

## 2021-03-22 DIAGNOSIS — M47816 Spondylosis without myelopathy or radiculopathy, lumbar region: Secondary | ICD-10-CM | POA: Diagnosis not present

## 2021-04-29 DIAGNOSIS — M47816 Spondylosis without myelopathy or radiculopathy, lumbar region: Secondary | ICD-10-CM | POA: Diagnosis not present

## 2021-04-29 DIAGNOSIS — M5416 Radiculopathy, lumbar region: Secondary | ICD-10-CM | POA: Diagnosis not present

## 2021-05-20 DIAGNOSIS — R69 Illness, unspecified: Secondary | ICD-10-CM | POA: Diagnosis not present

## 2021-05-20 DIAGNOSIS — I10 Essential (primary) hypertension: Secondary | ICD-10-CM | POA: Diagnosis not present

## 2021-05-20 DIAGNOSIS — M1990 Unspecified osteoarthritis, unspecified site: Secondary | ICD-10-CM | POA: Diagnosis not present

## 2021-05-20 DIAGNOSIS — Z823 Family history of stroke: Secondary | ICD-10-CM | POA: Diagnosis not present

## 2021-05-20 DIAGNOSIS — Z818 Family history of other mental and behavioral disorders: Secondary | ICD-10-CM | POA: Diagnosis not present

## 2021-05-20 DIAGNOSIS — R32 Unspecified urinary incontinence: Secondary | ICD-10-CM | POA: Diagnosis not present

## 2021-05-20 DIAGNOSIS — G8929 Other chronic pain: Secondary | ICD-10-CM | POA: Diagnosis not present

## 2021-05-20 DIAGNOSIS — Z8249 Family history of ischemic heart disease and other diseases of the circulatory system: Secondary | ICD-10-CM | POA: Diagnosis not present

## 2021-05-20 DIAGNOSIS — F419 Anxiety disorder, unspecified: Secondary | ICD-10-CM | POA: Diagnosis not present

## 2021-05-20 DIAGNOSIS — M797 Fibromyalgia: Secondary | ICD-10-CM | POA: Diagnosis not present

## 2021-06-03 DIAGNOSIS — M7062 Trochanteric bursitis, left hip: Secondary | ICD-10-CM | POA: Diagnosis not present

## 2021-08-22 ENCOUNTER — Ambulatory Visit (INDEPENDENT_AMBULATORY_CARE_PROVIDER_SITE_OTHER): Payer: Medicare HMO | Admitting: Nurse Practitioner

## 2021-08-22 ENCOUNTER — Other Ambulatory Visit: Payer: Self-pay

## 2021-08-22 ENCOUNTER — Telehealth: Payer: Self-pay | Admitting: Nurse Practitioner

## 2021-08-22 ENCOUNTER — Encounter: Payer: Self-pay | Admitting: Nurse Practitioner

## 2021-08-22 VITALS — BP 136/74 | HR 74 | Temp 98.2°F | Ht 61.0 in | Wt 192.0 lb

## 2021-08-22 DIAGNOSIS — E785 Hyperlipidemia, unspecified: Secondary | ICD-10-CM | POA: Diagnosis not present

## 2021-08-22 DIAGNOSIS — I10 Essential (primary) hypertension: Secondary | ICD-10-CM | POA: Diagnosis not present

## 2021-08-22 DIAGNOSIS — G4700 Insomnia, unspecified: Secondary | ICD-10-CM | POA: Diagnosis not present

## 2021-08-22 DIAGNOSIS — R0683 Snoring: Secondary | ICD-10-CM

## 2021-08-22 DIAGNOSIS — R400 Somnolence: Secondary | ICD-10-CM | POA: Diagnosis not present

## 2021-08-22 DIAGNOSIS — Z6836 Body mass index (BMI) 36.0-36.9, adult: Secondary | ICD-10-CM | POA: Diagnosis not present

## 2021-08-22 DIAGNOSIS — Z1329 Encounter for screening for other suspected endocrine disorder: Secondary | ICD-10-CM

## 2021-08-22 DIAGNOSIS — Z131 Encounter for screening for diabetes mellitus: Secondary | ICD-10-CM | POA: Diagnosis not present

## 2021-08-22 DIAGNOSIS — E538 Deficiency of other specified B group vitamins: Secondary | ICD-10-CM

## 2021-08-22 LAB — COMPREHENSIVE METABOLIC PANEL
ALT: 22 U/L (ref 0–35)
AST: 16 U/L (ref 0–37)
Albumin: 4.4 g/dL (ref 3.5–5.2)
Alkaline Phosphatase: 70 U/L (ref 39–117)
BUN: 18 mg/dL (ref 6–23)
CO2: 30 mEq/L (ref 19–32)
Calcium: 9.7 mg/dL (ref 8.4–10.5)
Chloride: 104 mEq/L (ref 96–112)
Creatinine, Ser: 0.71 mg/dL (ref 0.40–1.20)
GFR: 86.56 mL/min (ref >60.00)
Glucose, Bld: 89 mg/dL (ref 70–99)
Potassium: 4.5 mEq/L (ref 3.5–5.1)
Sodium: 140 mEq/L (ref 135–145)
Total Bilirubin: 0.5 mg/dL (ref 0.2–1.2)
Total Protein: 7.2 g/dL (ref 6.0–8.3)

## 2021-08-22 LAB — CBC WITH DIFFERENTIAL/PLATELET
Basophils Absolute: 0 10*3/uL (ref 0.0–0.1)
Basophils Relative: 0.9 % (ref 0.0–3.0)
Eosinophils Absolute: 0.1 10*3/uL (ref 0.0–0.7)
Eosinophils Relative: 2 % (ref 0.0–5.0)
HCT: 40.9 % (ref 36.0–46.0)
Hemoglobin: 13.7 g/dL (ref 12.0–15.0)
Lymphocytes Relative: 39.7 % (ref 12.0–46.0)
Lymphs Abs: 1.8 10*3/uL (ref 0.7–4.0)
MCHC: 33.4 g/dL (ref 30.0–36.0)
MCV: 91 fl (ref 78.0–100.0)
Monocytes Absolute: 0.5 10*3/uL (ref 0.1–1.0)
Monocytes Relative: 10.4 % (ref 3.0–12.0)
Neutro Abs: 2.1 10*3/uL (ref 1.4–7.7)
Neutrophils Relative %: 47 % (ref 43.0–77.0)
Platelets: 190 10*3/uL (ref 150.0–400.0)
RBC: 4.5 Mil/uL (ref 3.87–5.11)
RDW: 13.1 % (ref 11.5–15.5)
WBC: 4.6 10*3/uL (ref 4.0–10.5)

## 2021-08-22 LAB — LIPID PANEL
Cholesterol: 256 mg/dL — ABNORMAL HIGH (ref 0–200)
HDL: 65 mg/dL (ref >39.00)
LDL Cholesterol: 166 mg/dL — ABNORMAL HIGH (ref 0–99)
NonHDL: 191.03
Total CHOL/HDL Ratio: 4
Triglycerides: 124 mg/dL (ref 0.0–149.0)
VLDL: 24.8 mg/dL (ref 0.0–40.0)

## 2021-08-22 LAB — TSH: TSH: 2.83 u[IU]/mL (ref 0.35–5.50)

## 2021-08-22 LAB — HEMOGLOBIN A1C: Hgb A1c MFr Bld: 5.8 % (ref 4.6–6.5)

## 2021-08-22 NOTE — Telephone Encounter (Signed)
Results given numbers discussed and patient understood.

## 2021-08-22 NOTE — Telephone Encounter (Signed)
Please return  call to discuss lab results 

## 2021-08-22 NOTE — Progress Notes (Signed)
Subjective:  Patient ID: Leslie Griffin, female    DOB: 1950-12-29  Age: 70 y.o. MRN: [id number]  CC:  Chief Complaint  Patient presents with   New Patient (Initial Visit)      HPI  This patient arrives today for the above.  She recently moved to the area, and is looking to schedule with a new primary care provider.  Her main concern today is insomnia.  Insomnia: She tells me she has nights where she cannot sleep at all.  She has a hard time falling asleep and gets 0 hours at times.  She does have daytime sleepiness at times but will often even have a hard time napping.  She tells me she has been diagnosed with sleep apnea in the past but never did follow-up with treatment with CPAP.  She also has a history of fibromyalgia and believes that this is playing a role in her insomnia.  She has tried melatonin which she tells me does not help.  She is tried trazodone which he tells me causes daytime drowsiness.  She is allergic to Benadryl and is hesitant to try any antihistamine medications.  Past Medical History:  Diagnosis Date   Allergic rhinitis    Anxiety    Arthritis    Family history of colonic polyps    Fibromyalgia    History of colonic polyps    Hypercholesterolemia    Mitral valve prolapse    Ocular migraine    Palpitations       Family History  Problem Relation Age of Onset   Coronary artery disease Other    Heart disease Mother    Heart disease Father    Colon polyps Sister    Other Sister        low hemoglobin   Colon cancer Neg Hx    Esophageal cancer Neg Hx    Rectal cancer Neg Hx    Stomach cancer Neg Hx     Social History   Social History Narrative   Not on file   Social History   Tobacco Use   Smoking status: Never   Smokeless tobacco: Never  Substance Use Topics   Alcohol use: Yes    Comment: ocassionally      Current Meds  Medication Sig   vitamin B-12 (CYANOCOBALAMIN) 500 MCG tablet Take 500 mcg by mouth daily.   [DISCONTINUED]  gabapentin (NEURONTIN) 300 MG capsule Take 300 mg by mouth 2 (two) times daily.    ROS:  See HPI   Objective:   Today's Vitals: BP 136/74 (BP Location: Left Arm, Patient Position: Sitting, Cuff Size: Normal)   Pulse 74   Temp 98.2 F (36.8 C) (Oral)   Ht 5\' 1"  (1.549 m)   Wt 192 lb (87.1 kg)   SpO2 94%   BMI 36.28 kg/m  Vitals with BMI 08/22/2021 10/26/2019 10/26/2019  Height 5\' 1"  - -  Weight 192 lbs - -  BMI 76.2 - -  Systolic 263 335 456  Diastolic 74 83 66  Pulse 74 108 107     Physical Exam Vitals reviewed.  Constitutional:      General: She is not in acute distress.    Appearance: Normal appearance.  HENT:     Head: Normocephalic and atraumatic.  Neck:     Vascular: No carotid bruit.  Cardiovascular:     Rate and Rhythm: Normal rate and regular rhythm.     Pulses: Normal pulses.     Heart sounds:  Normal heart sounds.  Pulmonary:     Effort: Pulmonary effort is normal.     Breath sounds: Normal breath sounds.  Skin:    General: Skin is warm and dry.  Neurological:     General: No focal deficit present.     Mental Status: She is alert and oriented to person, place, and time.  Psychiatric:        Mood and Affect: Mood normal.        Behavior: Behavior normal.        Judgment: Judgment normal.         Assessment and Plan   1. Insomnia, unspecified type   2. Vitamin B12 deficiency   3. Hyperlipidemia, unspecified hyperlipidemia type   4. Hypertension, unspecified type   5. Class 2 severe obesity with serious comorbidity and body mass index (BMI) of 36.0 to 36.9 in adult, unspecified obesity type ([place])   6. Screening for diabetes mellitus   7. Screening for thyroid disorder   8. Snoring   9. Daytime sleepiness      Plan: 1.,  8-9.  We will check blood work today including thyroid panel.  We will also refer her to pulmonology for evaluation of possible sleep apnea.  We did discuss that if her insomnia persist we may need to refer to a sleep  medicine specialist or neurologist for treatment.  She has me understands. 2.  We will check CBC for anemia today. 3.  We will check lipid panel. 4.  Blood pressure reasonably controlled currently.  She is not on any medication right now, will have to monitor this closely moving forward. 5.-7.  We will check blood work for further evaluation.   Tests ordered Orders Placed This Encounter  Procedures   TSH   Hemoglobin A1c   Lipid panel   Comprehensive metabolic panel   CBC with Differential/Platelet   Ambulatory referral to Pulmonology      No orders of the defined types were placed in this encounter.   Patient to follow-up in approximately 3 months or sooner as needed.  Ailene Ards, NP

## 2021-09-05 NOTE — Telephone Encounter (Signed)
1.Medication Requested: spironolactone (aldactone) 25 mg   2. Pharmacy ([place], [place], [place]):  [id number] - [place], [place] Phone:  [phone]  Fax:  [phone]     3. On Med List: y  71. Last Visit with PCP:10.6.2022  5. Next visit date with PCP:01.12.23   Agent: Please be advised that RX refills may take up to 3 business days. We ask that you follow-up with your pharmacy.

## 2021-09-09 ENCOUNTER — Other Ambulatory Visit: Payer: Self-pay

## 2021-09-09 DIAGNOSIS — I10 Essential (primary) hypertension: Secondary | ICD-10-CM

## 2021-09-09 MED ORDER — SPIRONOLACTONE 25 MG PO TABS: 25.0000 mg | ORAL_TABLET | Freq: Every day | ORAL | 2 refills | Status: DC

## 2021-09-16 ENCOUNTER — Ambulatory Visit: Payer: Self-pay

## 2021-09-16 ENCOUNTER — Ambulatory Visit: Payer: Medicare HMO | Admitting: Orthopedic Surgery

## 2021-09-16 ENCOUNTER — Encounter: Payer: Self-pay | Admitting: Orthopedic Surgery

## 2021-09-16 ENCOUNTER — Other Ambulatory Visit: Payer: Self-pay

## 2021-09-16 DIAGNOSIS — M79672 Pain in left foot: Secondary | ICD-10-CM | POA: Diagnosis not present

## 2021-09-16 DIAGNOSIS — M25572 Pain in left ankle and joints of left foot: Secondary | ICD-10-CM | POA: Diagnosis not present

## 2021-09-16 DIAGNOSIS — M76822 Posterior tibial tendinitis, left leg: Secondary | ICD-10-CM

## 2021-09-16 DIAGNOSIS — M25571 Pain in right ankle and joints of right foot: Secondary | ICD-10-CM | POA: Diagnosis not present

## 2021-09-16 DIAGNOSIS — M79671 Pain in right foot: Secondary | ICD-10-CM

## 2021-09-16 DIAGNOSIS — M76821 Posterior tibial tendinitis, right leg: Secondary | ICD-10-CM | POA: Diagnosis not present

## 2021-09-16 DIAGNOSIS — G8929 Other chronic pain: Secondary | ICD-10-CM

## 2021-09-16 NOTE — Progress Notes (Signed)
Office Visit Note   Patient: Leslie Griffin           Date of Birth: 08/13/1951           MRN: [id number] Visit Date: 09/16/2021              Requested by: Ailene Ards, NP [address] [place],  [place] [address] PCP: Ailene Ards, NP  Chief Complaint  Patient presents with   Right Foot - Pain   Left Foot - Pain   Left Ankle - Pain   Right Ankle - Pain      HPI: Patient is a 70 year old woman who presents with progressive deformity and pain of both feet and ankles.  She states she was evaluated several years ago in [place] by an orthopedic surgeon who recommended amputation.  She has used arch supports orthopedic shoes without relief.  Patient states that she has difficulty walking the dog and difficulty shopping she has to lean over on the cart.  She is the caregiver for her husband.  Assessment & Plan: Visit Diagnoses:  1. Chronic pain of both ankles   2. Bilateral foot pain   3. Posterior tibial tendinitis, left leg   4. Posterior tibial tendinitis, right leg     Plan: Discussed with the patient that her options are either to continue with conservative therapy including ankle stabilizing orthosis versus talonavicular and subtalar fusion.  The foot fusion should resolve the posterior tibial tendon insufficiency with the arthritis in her ankle unsure if this would could help unload the deforming forces causing the ankle arthritis.  Follow-Up Instructions: Return if symptoms worsen or fail to improve.   Ortho Exam  Patient is alert, oriented, no adenopathy, well-dressed, normal affect, normal respiratory effort. Examination patient has a palpable dorsalis pedis and posterior tibial pulse bilaterally.  She has posterior tibial tendon insufficiency left worse than right with a pronated valgus forefoot.  She has pain to palpation along the medial column pain to palpation of the sinus Tarsi.  Patient has weakness with inversion.  Patient states she is on Neurontin for  fibromyalgia.  Imaging: XR Ankle Complete Left  Result Date: 09/16/2021 2 view radiographs of the left foot shows varus alignment of the talus compared to the medial column.  There is pes planus with subtalar collapse.  XR Ankle 2 Views Right  Result Date: 09/16/2021 2 view radiographs of the right ankle shows degenerative changes posterior aspect of the tibial talar joint.  There is collapse across the subtalar joint.  The joint space is congruent.  XR Foot 2 Views Left  Result Date: 09/16/2021 Three-view radiographs of the left ankle shows arthritic changes of the tibial talar joint.  There are osteophytic bone spurs.  XR Foot 2 Views Right  Result Date: 09/16/2021 2 view radiographs of the right foot shows pes planus with collapse of the subtalar joint.  There is varus alignment of the talar head compared to the medial column.  No images are attached to the encounter.  Labs: Lab Results  Component Value Date   HGBA1C 5.8 08/22/2021     Lab Results  Component Value Date   ALBUMIN 4.4 08/22/2021   ALBUMIN 4.4 10/26/2019   ALBUMIN 4.5 10/07/2019    No results found for: MG No results found for: VD25OH  No results found for: PREALBUMIN CBC EXTENDED Latest Ref Rng & Units 08/22/2021 10/26/2019 10/07/2019  WBC 4.0 - 10.5 K/uL 4.6 4.6 5.4  RBC 3.87 - 5.11  Mil/uL 4.50 4.06 4.31  HGB 12.0 - 15.0 g/dL 13.7 12.3 13.2  HCT 36.0 - 46.0 % 40.9 37.8 39.8  PLT 150.0 - 400.0 K/uL 190.0 225.0 216.0  NEUTROABS 1.4 - 7.7 K/uL 2.1 2.7 2.7  LYMPHSABS 0.7 - 4.0 K/uL 1.8 1.5 2.1     There is no height or weight on file to calculate BMI.  Orders:  Orders Placed This Encounter  Procedures   XR Ankle 2 Views Right   XR Foot 2 Views Right   XR Ankle Complete Left   XR Foot 2 Views Left   No orders of the defined types were placed in this encounter.    Procedures: No procedures performed  Clinical Data: No additional findings.  ROS:  All other systems negative, except as  noted in the HPI. Review of Systems  Objective: Vital Signs: There were no vitals taken for this visit.  Specialty Comments:  No specialty comments available.  PMFS History: Patient Active Problem List   Diagnosis Date Noted   Vitamin B12 deficiency 08/22/2021   HLD (hyperlipidemia) 08/22/2021   HTN (hypertension) 08/22/2021   Past Medical History:  Diagnosis Date   Allergic rhinitis    Anxiety    Arthritis    Family history of colonic polyps    Fibromyalgia    History of colonic polyps    Hypercholesterolemia    Mitral valve prolapse    Ocular migraine    Palpitations     Family History  Problem Relation Age of Onset   Coronary artery disease Other    Heart disease Mother    Heart disease Father    Colon polyps Sister    Other Sister        low hemoglobin   Colon cancer Neg Hx    Esophageal cancer Neg Hx    Rectal cancer Neg Hx    Stomach cancer Neg Hx     Past Surgical History:  Procedure Laterality Date   COLONOSCOPY  10/05/2017   Mild sigmoid diverticulosis. Otherwise normal colonoscopy.    Social History   Occupational History   Not on file  Tobacco Use   Smoking status: Never   Smokeless tobacco: Never  Vaping Use   Vaping Use: Never used  Substance and Sexual Activity   Alcohol use: Yes    Comment: ocassionally    Drug use: No   Sexual activity: Not on file

## 2021-09-17 ENCOUNTER — Telehealth: Payer: Self-pay | Admitting: Orthopedic Surgery

## 2021-09-17 NOTE — Telephone Encounter (Signed)
You saw pt in office yesterday and discussed a foot fusion surgery. Pt would like to proceed please complete blue sheet.

## 2021-09-17 NOTE — Telephone Encounter (Signed)
Patient called. She says she would like to have surgery on her ankle. Her call back number is [phone]

## 2021-09-17 NOTE — Telephone Encounter (Signed)
Surgery options were discussed during her visit on 09/16/21

## 2021-09-19 ENCOUNTER — Encounter: Payer: Self-pay | Admitting: Orthopedic Surgery

## 2021-09-19 ENCOUNTER — Telehealth: Payer: Self-pay | Admitting: Orthopedic Surgery

## 2021-10-14 ENCOUNTER — Telehealth: Payer: Self-pay | Admitting: Orthopedic Surgery

## 2021-10-14 NOTE — Telephone Encounter (Signed)
Pt submitted medical release forms, FMLA forms, and $25.00 cash payment to [employer]. Accepted 10/14/21

## 2021-10-28 NOTE — Progress Notes (Signed)
Surgical Instructions    Your procedure is scheduled on 11/01/21.  Report to [facility] Main Entrance "A" at 5:30 A.M., then check in with the Admitting office.  Call this number if you have problems the morning of surgery:  [phone]   If you have any questions prior to your surgery date call [phone]: Open Monday-Friday 8am-4pm    Remember:  Do not eat after midnight the night before your surgery  You may drink clear liquids until 4:30am the morning of your surgery.   Clear liquids allowed are: Water, Non-Citrus Juices (without pulp), Carbonated Beverages, Clear Tea, Black Coffee ONLY (NO MILK, CREAM OR POWDERED CREAMER of any kind), and Gatorade  Patient Instructions  The night before surgery:  No food after midnight. ONLY clear liquids after midnight  The day of surgery (if you do NOT have diabetes):  Drink ONE (1) Pre-Surgery Clear Ensure by 4:30am the morning of surgery. Drink in one sitting. Do not sip.  This drink was given to you during your hospital  pre-op appointment visit. Nothing else to drink after completing the  Pre-Surgery Clear Ensure.         If you have questions, please contact your surgeon's office.     Take these medicines the morning of surgery with A SIP OF WATER  gabapentin (NEURONTIN)    As of today, STOP taking any Aspirin (unless otherwise instructed by your surgeon) Aleve, Naproxen, Ibuprofen, Motrin, Advil, Goody's, BC's, all herbal medications, fish oil, and all vitamins.     After your COVID test   You are not required to quarantine however you are required to wear a well-fitting mask when you are out and around people not in your household.  If your mask becomes wet or soiled, replace with a new one.  Wash your hands often with soap and water for 20 seconds or clean your hands with an alcohol-based hand sanitizer that contains at least 60% alcohol.  Do not share personal items.  Notify your provider: if you are in close  contact with someone who has COVID  or if you develop a fever of 100.4 or greater, sneezing, cough, sore throat, shortness of breath or body aches.             Do not wear jewelry or makeup Do not wear lotions, powders, perfumes/colognes, or deodorant. Do not shave 48 hours prior to surgery.   Do not bring valuables to the hospital. DO Not wear nail polish, gel polish, artificial nails, or any other type of covering on natural nails including finger and toenails. If patients have artificial nails, gel coating, etc. that need to be removed by a nail salon, please have this removed prior to surgery or surgery may need to be canceled/delayed if the surgeon/ anesthesia feels like the patient is unable to be adequately monitored.             [facility] is not responsible for any belongings or valuables.  Do NOT Smoke (Tobacco/Vaping)  24 hours prior to your procedure  If you use a CPAP at night, you may bring your mask for your overnight stay.   Contacts, glasses, hearing aids, dentures or partials may not be worn into surgery, please bring cases for these belongings   For patients admitted to the hospital, discharge time will be determined by your treatment team.   Patients discharged the day of surgery will not be allowed to drive home, and someone needs to stay with them for 24  hours.  NO VISITORS WILL BE ALLOWED IN PRE-OP WHERE PATIENTS ARE PREPPED FOR SURGERY.  ONLY 1 SUPPORT PERSON MAY BE PRESENT IN THE WAITING ROOM WHILE YOU ARE IN SURGERY.  IF YOU ARE TO BE ADMITTED, ONCE YOU ARE IN YOUR ROOM YOU WILL BE ALLOWED TWO (2) VISITORS. 1 (ONE) VISITOR MAY STAY OVERNIGHT BUT MUST ARRIVE TO THE ROOM BY 8pm.  Minor children may have two parents present. Special consideration for safety and communication needs will be reviewed on a case by case basis.  Special instructions:    Oral Hygiene is also important to reduce your risk of infection.  Remember - BRUSH YOUR TEETH THE MORNING OF SURGERY  WITH YOUR REGULAR TOOTHPASTE   [place]- Preparing For Surgery  Before surgery, you can play an important role. Because skin is not sterile, your skin needs to be as free of germs as possible. You can reduce the number of germs on your skin by washing with CHG (chlorahexidine gluconate) Soap before surgery.  CHG is an antiseptic cleaner which kills germs and bonds with the skin to continue killing germs even after washing.     Please do not use if you have an allergy to CHG or antibacterial soaps. If your skin becomes reddened/irritated stop using the CHG.  Do not shave (including legs and underarms) for at least 48 hours prior to first CHG shower. It is OK to shave your face.  Please follow these instructions carefully.     Shower the NIGHT BEFORE SURGERY and the MORNING OF SURGERY with CHG Soap.   If you chose to wash your hair, wash your hair first as usual with your normal shampoo. After you shampoo, rinse your hair and body thoroughly to remove the shampoo.  Then [employer] and genitals (private parts) with your normal soap and rinse thoroughly to remove soap.  After that Use CHG Soap as you would any other liquid soap. You can apply CHG directly to the skin and wash gently with a scrungie or a clean washcloth.   Apply the CHG Soap to your body ONLY FROM THE NECK DOWN.  Do not use on open wounds or open sores. Avoid contact with your eyes, ears, mouth and genitals (private parts). Wash Face and genitals (private parts)  with your normal soap.   Wash thoroughly, paying special attention to the area where your surgery will be performed.  Thoroughly rinse your body with warm water from the neck down.  DO NOT shower/wash with your normal soap after using and rinsing off the CHG Soap.  Pat yourself dry with a CLEAN TOWEL.  Wear CLEAN PAJAMAS to bed the night before surgery  Place CLEAN SHEETS on your bed the night before your surgery  DO NOT SLEEP WITH PETS.   Day of  Surgery: Take a shower with CHG soap. Wear Clean/Comfortable clothing the morning of surgery Do not apply any deodorants/lotions.   Remember to brush your teeth WITH YOUR REGULAR TOOTHPASTE.   Please read over the following fact sheets that you were given.

## 2021-10-29 ENCOUNTER — Encounter (HOSPITAL_COMMUNITY)
Admission: RE | Admit: 2021-10-29 | Discharge: 2021-10-29 | Disposition: A | Discharge status: Home / Self Care | Payer: Medicare HMO | Source: Ambulatory Visit | Attending: Orthopedic Surgery | Admitting: Orthopedic Surgery

## 2021-10-29 ENCOUNTER — Encounter (HOSPITAL_COMMUNITY): Payer: Self-pay

## 2021-10-29 ENCOUNTER — Other Ambulatory Visit: Payer: Self-pay

## 2021-10-29 VITALS — BP 147/86 | HR 88 | Temp 98.4°F | Resp 17 | Ht 61.0 in | Wt 192.9 lb

## 2021-10-29 DIAGNOSIS — Z20822 Contact with and (suspected) exposure to covid-19: Secondary | ICD-10-CM | POA: Insufficient documentation

## 2021-10-29 DIAGNOSIS — Z01818 Encounter for other preprocedural examination: Secondary | ICD-10-CM | POA: Insufficient documentation

## 2021-10-29 DIAGNOSIS — I10 Essential (primary) hypertension: Secondary | ICD-10-CM

## 2021-10-29 HISTORY — DX: Cardiac murmur, unspecified: R01.1

## 2021-10-29 HISTORY — DX: Essential (primary) hypertension: I10

## 2021-10-29 LAB — BASIC METABOLIC PANEL
Anion gap: 7 (ref 5–15)
BUN: 19 mg/dL (ref 8–23)
CO2: 28 mmol/L (ref 22–32)
Calcium: 9.3 mg/dL (ref 8.9–10.3)
Chloride: 104 mmol/L (ref 98–111)
Creatinine, Ser: 0.64 mg/dL (ref 0.44–1.00)
GFR, Estimated: >60 mL/min (ref >60)
Glucose, Bld: 96 mg/dL (ref 70–99)
Potassium: 4 mmol/L (ref 3.5–5.1)
Sodium: 139 mmol/L (ref 135–145)

## 2021-10-29 LAB — SURGICAL PCR SCREEN
MRSA, PCR: NEGATIVE
Staphylococcus aureus: NEGATIVE

## 2021-10-29 LAB — CBC
HCT: 41.7 % (ref 36.0–46.0)
Hemoglobin: 13.4 g/dL (ref 12.0–15.0)
MCH: 30.1 pg (ref 26.0–34.0)
MCHC: 32.1 g/dL (ref 30.0–36.0)
MCV: 93.7 fL (ref 80.0–100.0)
Platelets: 210 10*3/uL (ref 150–400)
RBC: 4.45 MIL/uL (ref 3.87–5.11)
RDW: 12.9 % (ref 11.5–15.5)
WBC: 5.5 10*3/uL (ref 4.0–10.5)
nRBC: 0 % (ref 0.0–0.2)

## 2021-10-29 LAB — SARS CORONAVIRUS 2 (TAT 6-24 HRS): SARS Coronavirus 2: NEGATIVE

## 2021-10-29 NOTE — Progress Notes (Signed)
PCP - Leslie Griffin Cardiologist - denies  PPM/ICD - denies   Chest x-ray - n/a EKG - 10/29/21 Stress Test - 08/03/17 ECHO - 02/21/10 Cardiac Cath - denies  Sleep Study - +OSA CPAP - it was recommended but patient never followed up or picked it up  No diabetes  As of today, STOP taking any Aspirin (unless otherwise instructed by your surgeon) Aleve, Naproxen, Ibuprofen, Motrin, Advil, Goody's, BC's, all herbal medications, fish oil, and all vitamins.  ERAS Protcol -yes PRE-SURGERY Ensure or G2- ensure give  COVID TEST- 10/29/21 in PAT   Anesthesia review: yes, cardiac history  Patient denies shortness of breath, fever, cough and chest pain at PAT appointment   All instructions explained to the patient, with a verbal understanding of the material. Patient agrees to go over the instructions while at home for a better understanding. Patient also instructed to self quarantine after being tested for COVID-19. The opportunity to ask questions was provided.

## 2021-10-30 NOTE — Anesthesia Preprocedure Evaluation (Addendum)
Anesthesia Evaluation  Patient identified by MRN, date of birth, ID band Patient awake    Reviewed: Allergy & Precautions, NPO status , Patient's Chart, lab work & pertinent test results  Airway Mallampati: II  TM Distance: >3 FB Neck ROM: Full    Dental no notable dental hx.    Pulmonary neg pulmonary ROS,    Pulmonary exam normal        Cardiovascular hypertension,  Rhythm:Regular Rate:Normal     Neuro/Psych  Headaches, Anxiety    GI/Hepatic negative GI ROS, Neg liver ROS,   Endo/Other  negative endocrine ROS  Renal/GU negative Renal ROS  negative genitourinary   Musculoskeletal  (+) Arthritis , Fibromyalgia -  Abdominal Normal abdominal exam  (+)   Peds  Hematology negative hematology ROS (+)   Anesthesia Other Findings   Reproductive/Obstetrics                           Anesthesia Physical Anesthesia Plan  ASA: 2  Anesthesia Plan: General and Regional   Post-op Pain Management: Regional block   Induction: Intravenous  PONV Risk Score and Plan: 3 and Ondansetron, Dexamethasone and Treatment may vary due to age or medical condition  Airway Management Planned: Mask and LMA  Additional Equipment: None  Intra-op Plan:   Post-operative Plan: Extubation in OR  Informed Consent: I have reviewed the patients History and Physical, chart, labs and discussed the procedure including the risks, benefits and alternatives for the proposed anesthesia with the patient or authorized representative who has indicated his/her understanding and acceptance.     Dental advisory given  Plan Discussed with: CRNA  Anesthesia Plan Comments: (PAT note by Karoline Caldwell, PA-C: Patient evaluated by cardiology at [place] in 2018 for atypical chest pain.  Echo was essentially normal and nuclear stress was nonischemic.  Lifestyle modification and risk reduction recommended.  OSA, not  on CPAP.  Preop labs reviewed, WNL.  EKG 11/15/2021: NSR with sinus arrhythmia.  Rate 80.  Echocardiogram 07/17/2017 (Care Everywhere): There is concentric, mild hypertrophy of the left ventricle. Left ventricular systolic function appears normal. However, Doppler findings suggest: (1) diastolic dysfunction of leftventricle (grade 1 - impaired relaxation); (2) no appreciable valvedysfunction (Note: Upon re-review, minor bowing of posterior leaflet of mitral valve...); and (3) no appreciable pulmonary hypertension. The left and right atria each appear grossly normal (although by volume,there is mild left atrial dilatation). There is no appreciable pericardial effusion.  Regadenoson myocardial perfusion scan 08/03/2017 (Care Everywhere): Normal rest/Combination of Exercise and Regadenoson myocardial perfusion SPECT study demonstrating homogeneous tracer uptake throughout rest and stress imaging. Left ventricular wall motion is normal. This is a low risk study.  )      Anesthesia Quick Evaluation

## 2021-10-30 NOTE — Progress Notes (Signed)
Anesthesia Chart Review:  Patient evaluated by cardiology at [place] in 2018 for atypical chest pain.  Echo was essentially normal and nuclear stress was nonischemic.  Lifestyle modification and risk reduction recommended.  OSA, not on CPAP.  Preop labs reviewed, WNL.  EKG 11/15/2021: NSR with sinus arrhythmia.  Rate 80.  Echocardiogram 07/17/2017 (Care Everywhere): There is concentric, mild hypertrophy of the left ventricle. Left ventricular systolic function appears normal. However, Doppler findings suggest:  (1) diastolic dysfunction of left ventricle (grade 1 - impaired relaxation); (2) no appreciable valve dysfunction (Note:  Upon re-review, minor bowing of posterior leaflet of mitral valve...); and (3) no appreciable pulmonary hypertension. The left and right atria each appear grossly normal (although by volume, there is mild left atrial dilatation). There is no appreciable pericardial effusion.   Regadenoson myocardial perfusion scan 08/03/2017 (Care Everywhere): Normal rest/Combination of Exercise and Regadenoson myocardial perfusion SPECT study demonstrating homogeneous tracer uptake throughout rest and stress imaging. Left ventricular wall motion is normal. This is a low risk study.    Leslie Griffin [facility] Short Stay [facility]/Anesthesiology Phone [phone] 10/30/2021 3:02 PM

## 2021-11-01 ENCOUNTER — Encounter (HOSPITAL_COMMUNITY)
Admission: RE | Disposition: A | Discharge status: Home / Self Care | Payer: Self-pay | Source: Home / Self Care | Attending: Orthopedic Surgery

## 2021-11-01 ENCOUNTER — Ambulatory Visit (HOSPITAL_COMMUNITY): Payer: Medicare HMO

## 2021-11-01 ENCOUNTER — Ambulatory Visit (HOSPITAL_COMMUNITY): Payer: Medicare HMO | Admitting: Physician Assistant

## 2021-11-01 ENCOUNTER — Ambulatory Visit (HOSPITAL_COMMUNITY)
Admission: RE | Admit: 2021-11-01 | Discharge: 2021-11-01 | Disposition: A | Discharge status: Home / Self Care | Payer: Medicare HMO | Attending: Orthopedic Surgery | Admitting: Orthopedic Surgery

## 2021-11-01 ENCOUNTER — Ambulatory Visit (HOSPITAL_COMMUNITY): Payer: Medicare HMO | Admitting: Anesthesiology

## 2021-11-01 ENCOUNTER — Other Ambulatory Visit: Payer: Self-pay

## 2021-11-01 ENCOUNTER — Encounter (HOSPITAL_COMMUNITY): Payer: Self-pay | Admitting: Orthopedic Surgery

## 2021-11-01 DIAGNOSIS — E119 Type 2 diabetes mellitus without complications: Secondary | ICD-10-CM | POA: Insufficient documentation

## 2021-11-01 DIAGNOSIS — M76821 Posterior tibial tendinitis, right leg: Secondary | ICD-10-CM | POA: Insufficient documentation

## 2021-11-01 DIAGNOSIS — M79671 Pain in right foot: Secondary | ICD-10-CM | POA: Diagnosis not present

## 2021-11-01 DIAGNOSIS — M76822 Posterior tibial tendinitis, left leg: Secondary | ICD-10-CM | POA: Diagnosis not present

## 2021-11-01 DIAGNOSIS — G8929 Other chronic pain: Secondary | ICD-10-CM | POA: Insufficient documentation

## 2021-11-01 DIAGNOSIS — M25572 Pain in left ankle and joints of left foot: Secondary | ICD-10-CM | POA: Insufficient documentation

## 2021-11-01 DIAGNOSIS — M25571 Pain in right ankle and joints of right foot: Secondary | ICD-10-CM | POA: Insufficient documentation

## 2021-11-01 DIAGNOSIS — G8918 Other acute postprocedural pain: Secondary | ICD-10-CM | POA: Diagnosis not present

## 2021-11-01 DIAGNOSIS — M79672 Pain in left foot: Secondary | ICD-10-CM | POA: Insufficient documentation

## 2021-11-01 HISTORY — PX: FOOT ARTHRODESIS: SHX1655

## 2021-11-01 LAB — GLUCOSE, CAPILLARY: Glucose-Capillary: 187 mg/dL — ABNORMAL HIGH (ref 70–99)

## 2021-11-01 SURGERY — FUSION, JOINT, FOOT
Anesthesia: Regional | Site: Foot | Laterality: Left

## 2021-11-01 MED ORDER — VANCOMYCIN HCL IN DEXTROSE 1-5 GM/200ML-% IV SOLN
INTRAVENOUS | Status: AC
  Administered 2021-11-01: 1000 mg via INTRAVENOUS
  Filled 2021-11-01: qty 200

## 2021-11-01 MED ORDER — MIDAZOLAM HCL 2 MG/2ML IJ SOLN
INTRAMUSCULAR | Status: DC | PRN
  Administered 2021-11-01: 1 mg via INTRAVENOUS

## 2021-11-01 MED ORDER — ONDANSETRON HCL 4 MG/2ML IJ SOLN
INTRAMUSCULAR | Status: DC | PRN
  Administered 2021-11-01: 4 mg via INTRAVENOUS

## 2021-11-01 MED ORDER — PROPOFOL 10 MG/ML IV BOLUS
INTRAVENOUS | Status: DC | PRN
  Administered 2021-11-01: 150 mg via INTRAVENOUS

## 2021-11-01 MED ORDER — EPHEDRINE SULFATE 50 MG/ML IJ SOLN
INTRAMUSCULAR | Status: DC | PRN
  Administered 2021-11-01: 10 mg via INTRAVENOUS
  Administered 2021-11-01 (×2): 5 mg via INTRAVENOUS

## 2021-11-01 MED ORDER — PROPOFOL 10 MG/ML IV BOLUS
INTRAVENOUS | Status: AC
  Filled 2021-11-01: qty 20

## 2021-11-01 MED ORDER — VANCOMYCIN HCL IN DEXTROSE 1-5 GM/200ML-% IV SOLN: 1000.0000 mg | Freq: Once | INTRAVENOUS | Status: AC

## 2021-11-01 MED ORDER — ONDANSETRON HCL 4 MG/2ML IJ SOLN
INTRAMUSCULAR | Status: AC
  Filled 2021-11-01: qty 2

## 2021-11-01 MED ORDER — PHENYLEPHRINE 40 MCG/ML (10ML) SYRINGE FOR IV PUSH (FOR BLOOD PRESSURE SUPPORT)
PREFILLED_SYRINGE | INTRAVENOUS | Status: AC
  Filled 2021-11-01: qty 20

## 2021-11-01 MED ORDER — DEXAMETHASONE SODIUM PHOSPHATE 10 MG/ML IJ SOLN
INTRAMUSCULAR | Status: DC | PRN
  Administered 2021-11-01: 10 mg via INTRAVENOUS

## 2021-11-01 MED ORDER — 0.9 % SODIUM CHLORIDE (POUR BTL) OPTIME
TOPICAL | Status: DC | PRN
  Administered 2021-11-01: 1000 mL

## 2021-11-01 MED ORDER — LACTATED RINGERS IV SOLN: INTRAVENOUS | Status: DC

## 2021-11-01 MED ORDER — MIDAZOLAM HCL 2 MG/2ML IJ SOLN
INTRAMUSCULAR | Status: AC
  Filled 2021-11-01: qty 2

## 2021-11-01 MED ORDER — FENTANYL CITRATE (PF) 250 MCG/5ML IJ SOLN
INTRAMUSCULAR | Status: DC | PRN
  Administered 2021-11-01: 50 ug via INTRAVENOUS

## 2021-11-01 MED ORDER — CLONIDINE HCL (ANALGESIA) 100 MCG/ML EP SOLN
EPIDURAL | Status: DC | PRN
  Administered 2021-11-01: 100 ug

## 2021-11-01 MED ORDER — ORAL CARE MOUTH RINSE: 15.0000 mL | Freq: Once | OROMUCOSAL | Status: AC

## 2021-11-01 MED ORDER — OXYCODONE-ACETAMINOPHEN 5-325 MG PO TABS: 1.0000 | ORAL_TABLET | ORAL | 0 refills | Status: DC | PRN

## 2021-11-01 MED ORDER — EPHEDRINE 5 MG/ML INJ
INTRAVENOUS | Status: AC
  Filled 2021-11-01: qty 5

## 2021-11-01 MED ORDER — CHLORHEXIDINE GLUCONATE 0.12 % MT SOLN
15.0000 mL | Freq: Once | OROMUCOSAL | Status: AC
  Administered 2021-11-01: 15 mL via OROMUCOSAL
  Filled 2021-11-01: qty 15

## 2021-11-01 MED ORDER — LIDOCAINE 2% (20 MG/ML) 5 ML SYRINGE
INTRAMUSCULAR | Status: AC
  Filled 2021-11-01: qty 5

## 2021-11-01 MED ORDER — LIDOCAINE 2% (20 MG/ML) 5 ML SYRINGE
INTRAMUSCULAR | Status: DC | PRN
  Administered 2021-11-01: 60 mg via INTRAVENOUS

## 2021-11-01 MED ORDER — ROPIVACAINE HCL 5 MG/ML IJ SOLN
INTRAMUSCULAR | Status: DC | PRN
  Administered 2021-11-01: 30 mL via PERINEURAL

## 2021-11-01 MED ORDER — PHENYLEPHRINE HCL (PRESSORS) 10 MG/ML IV SOLN
INTRAVENOUS | Status: DC | PRN
  Administered 2021-11-01: 80 ug via INTRAVENOUS
  Administered 2021-11-01: 120 ug via INTRAVENOUS
  Administered 2021-11-01: 40 ug via INTRAVENOUS
  Administered 2021-11-01: 120 ug via INTRAVENOUS
  Administered 2021-11-01 (×2): 40 ug via INTRAVENOUS
  Administered 2021-11-01: 120 ug via INTRAVENOUS
  Administered 2021-11-01: 80 ug via INTRAVENOUS
  Administered 2021-11-01: 200 ug via INTRAVENOUS
  Administered 2021-11-01: 80 ug via INTRAVENOUS

## 2021-11-01 MED ORDER — FENTANYL CITRATE (PF) 250 MCG/5ML IJ SOLN
INTRAMUSCULAR | Status: AC
  Filled 2021-11-01: qty 5

## 2021-11-01 SURGICAL SUPPLY — 50 items
BAG COUNTER SPONGE SURGICOUNT (BAG) ×2 IMPLANT
BAG SURGICOUNT SPONGE COUNTING (BAG) ×1
BANDAGE ESMARK 6X9 LF (GAUZE/BANDAGES/DRESSINGS) IMPLANT
BIT DRILL 4.5 TYB 9 (BIT) ×2 IMPLANT
BLADE SAW SGTL HD 18.5X60.5X1. (BLADE) ×3 IMPLANT
BLADE SURG 10 STRL SS (BLADE) IMPLANT
BNDG COHESIVE 4X5 TAN STRL (GAUZE/BANDAGES/DRESSINGS) ×3 IMPLANT
BNDG COHESIVE 6X5 TAN NS LF (GAUZE/BANDAGES/DRESSINGS) ×2 IMPLANT
BNDG ESMARK 6X9 LF (GAUZE/BANDAGES/DRESSINGS) ×3
BNDG GAUZE ELAST 4 BULKY (GAUZE/BANDAGES/DRESSINGS) ×4 IMPLANT
BUR SURG 4X8 MED (BURR) IMPLANT
BURR SURG 4MMX8MM MEDIUM (BURR) ×2
BURR SURG 4X8 MED (BURR) ×4
COTTON STERILE ROLL (GAUZE/BANDAGES/DRESSINGS) ×3 IMPLANT
COVER SURGICAL LIGHT HANDLE (MISCELLANEOUS) ×6 IMPLANT
DRAPE INCISE IOBAN 66X45 STRL (DRAPES) ×3 IMPLANT
DRAPE OEC MINIVIEW 54X84 (DRAPES) IMPLANT
DRAPE U-SHAPE 47X51 STRL (DRAPES) ×3 IMPLANT
DRSG ADAPTIC 3X8 NADH LF (GAUZE/BANDAGES/DRESSINGS) ×3 IMPLANT
DURAPREP 26ML APPLICATOR (WOUND CARE) ×3 IMPLANT
ELECT REM PT RETURN 9FT ADLT (ELECTROSURGICAL) ×3
ELECTRODE REM PT RTRN 9FT ADLT (ELECTROSURGICAL) ×1 IMPLANT
GAUZE SPONGE 4X4 12PLY STRL (GAUZE/BANDAGES/DRESSINGS) ×3 IMPLANT
GAUZE SPONGE 4X4 12PLY STRL LF (GAUZE/BANDAGES/DRESSINGS) ×2 IMPLANT
GLOVE SURG ORTHO LTX SZ9 (GLOVE) ×3 IMPLANT
GLOVE SURG UNDER POLY LF SZ9 (GLOVE) ×3 IMPLANT
GOWN STRL REUS W/ TWL XL LVL3 (GOWN DISPOSABLE) ×3 IMPLANT
GOWN STRL REUS W/TWL XL LVL3 (GOWN DISPOSABLE) ×6
GUIDEWIRE 1.6 6IN (WIRE) ×2 IMPLANT
GUIDEWIRE TYB 2X9 (WIRE) ×2 IMPLANT
KIT BASIN OR (CUSTOM PROCEDURE TRAY) ×3 IMPLANT
KIT STAPLE ARCUS 20X17 STRL (Staple) ×2 IMPLANT
KIT TURNOVER KIT B (KITS) ×3 IMPLANT
MANIFOLD NEPTUNE II (INSTRUMENTS) ×3 IMPLANT
NS IRRIG 1000ML POUR BTL (IV SOLUTION) ×3 IMPLANT
PACK ORTHO EXTREMITY (CUSTOM PROCEDURE TRAY) ×3 IMPLANT
PAD ARMBOARD 7.5X6 YLW CONV (MISCELLANEOUS) ×6 IMPLANT
PAD CAST 4YDX4 CTTN HI CHSV (CAST SUPPLIES) ×1 IMPLANT
PADDING CAST COTTON 4X4 STRL (CAST SUPPLIES) ×2
PUTTY DBM STAGRAFT PLUS 5CC (Putty) ×2 IMPLANT
SCREW CANN HDLS SHT 6.5X70 (Screw) ×2 IMPLANT
SPONGE T-LAP 18X18 ~~LOC~~+RFID (SPONGE) ×3 IMPLANT
SUCTION FRAZIER HANDLE 10FR (MISCELLANEOUS) ×2
SUCTION TUBE FRAZIER 10FR DISP (MISCELLANEOUS) ×1 IMPLANT
SUT ETHILON 2 0 PSLX (SUTURE) ×7 IMPLANT
TOWEL GREEN STERILE (TOWEL DISPOSABLE) ×3 IMPLANT
TOWEL GREEN STERILE FF (TOWEL DISPOSABLE) ×3 IMPLANT
TUBE CONNECTING 12'X1/4 (SUCTIONS) ×1
TUBE CONNECTING 12X1/4 (SUCTIONS) ×2 IMPLANT
WATER STERILE IRR 1000ML POUR (IV SOLUTION) ×3 IMPLANT

## 2021-11-01 NOTE — Transfer of Care (Signed)
Immediate Anesthesia Transfer of Care Note  Patient: Leslie Griffin  Procedure(s) Performed: LEFT TALONAVICULAR AND SUBTALAR FUSION (Left: Foot)  Patient Location: PACU  Anesthesia Type:General and Regional  Level of Consciousness: awake, drowsy and patient cooperative  Airway & Oxygen Therapy: Patient Spontanous Breathing and Patient connected to face mask oxygen  Post-op Assessment: Report given to RN, Post -op Vital signs reviewed and stable and Patient moving all extremities  Post vital signs: Reviewed and stable  Last Vitals:  Vitals Value Taken Time  BP 114/73 11/01/21 0855  Temp    Pulse 87 11/01/21 0855  Resp 15 11/01/21 0855  SpO2 99 % 11/01/21 0855  Vitals shown include unvalidated device data.  Last Pain:  Vitals:   11/01/21 0646  TempSrc:   PainSc: 0-No pain         Complications: No notable events documented.

## 2021-11-01 NOTE — Op Note (Signed)
11/01/2021  8:55 AM  PATIENT:  Leslie Griffin    PRE-OPERATIVE DIAGNOSIS:  Left Posterior Tibial Tendon Insufficiency  POST-OPERATIVE DIAGNOSIS:  Same  PROCEDURE:  LEFT TALONAVICULAR AND SUBTALAR FUSION C arm fluoroscopy to verify reduction.   Application of 5 cc demineralized bone matrix  SURGEON:  Newt Minion, MD  PHYSICIAN ASSISTANT:None ANESTHESIA:   General  PREOPERATIVE INDICATIONS:  [name] is a  70 y.o. female with a diagnosis of Left Posterior Tibial Tendon Insufficiency who failed conservative measures and elected for surgical management.    The risks benefits and alternatives were discussed with the patient preoperatively including but not limited to the risks of infection, bleeding, nerve injury, cardiopulmonary complications, the need for revision surgery, among others, and the patient was willing to proceed.  OPERATIVE IMPLANTS: 1 staple one 6.5 headless cannulated screw and 5 cc of demineralized bone matrix.  @ENCIMAGES @  OPERATIVE FINDINGS: C-arm fluoroscopy verified reduction in both AP and lateral planes.  OPERATIVE PROCEDURE: Patient was brought the operating room after undergoing a regional anesthetic she then underwent a general anesthetic.  After adequate levels anesthesia were obtained patient's left lower extremity was prepped using DuraPrep draped into a sterile field a timeout was called.  An Esmarch was placed around the ankle.  An oblique incision was made over the sinus Tarsi the dorsal neurovascular structures as well as the peroneal tendons were protected a REM b was used to debride the articular cartilage from the posterior facet of the subtalar joint this was debrided back to healthy viable subchondral bone under direct visualization this was irrigated and 2-1/2 cc of demineralized bone matrix was placed in this area.  A dorsal incision was made over the talonavicular joint between the EHL and anterior tibial tendon.  Joint was irrigated and packed  with 2.5 cc of demineralized bone matrix.  The forefoot was reduced pronation and to neutral and a 20 mm staple was placed across the talonavicular joint.  See arthroscopy verified reduction.  Attention was then focused on the subtalar joint.  A guidewire was placed from the calcaneus up into the talus C arm verified reduction and a 70 mm screw was placed.  C-arm possibly verified reduction the incisions were closed using 2-0 nylon a sterile dressing was applied patient was extubated taken the PACU in stable condition.   DISCHARGE PLANNING:  Antibiotic duration: Preoperative antibiotics  Weightbearing: Touchdown weightbearing on the left  Pain medication: Prescription for Percocet  Dressing care/ Wound VAC: Dry dressing follow-up in 1 week to change the dressing  Ambulatory devices: Walker or crutches  Discharge to: Home.  Follow-up: In the office 1 week post operative.

## 2021-11-01 NOTE — Anesthesia Procedure Notes (Addendum)
Anesthesia Regional Block: Popliteal block   Pre-Anesthetic Checklist: , timeout performed,  Correct Patient, Correct Site, Correct Laterality,  Correct Procedure, Correct Position, site marked,  Risks and benefits discussed,  Surgical consent,  Pre-op evaluation,  At surgeon's request and post-op pain management  Laterality: Left  Prep: Dura Prep       Needles:  Injection technique: Single-shot  Needle Type: Echogenic Stimulator Needle     Needle Length: 10cm  Needle Gauge: 20     Additional Needles:   Procedures:,,,, ultrasound used (permanent image in chart),,    Narrative:  Start time: 11/01/2021 7:10 AM End time: 11/01/2021 7:12 AM Injection made incrementally with aspirations every 5 mL.  Performed by: Personally  Anesthesiologist: Darral Dash, DO  Additional Notes: Patient identified. Risks/Benefits/Options discussed with patient including but not limited to bleeding, infection, nerve damage, failed block, incomplete pain control. Patient expressed understanding and wished to proceed. All questions were answered. Sterile technique was used throughout the entire procedure. Please see nursing notes for vital signs. Aspirated in 5cc intervals with injection for negative confirmation. Patient was given instructions on fall risk and not to get out of bed. All questions and concerns addressed with instructions to call with any issues or inadequate analgesia.

## 2021-11-01 NOTE — Progress Notes (Signed)
Orthopedic Tech Progress Note Patient Details:  Leslie Griffin 27-Oct-1951 [id number]  Ortho Devices Type of Ortho Device: Crutches, CAM walker Ortho Device/Splint Location: left Ortho Device/Splint Interventions: Ordered, Application   Post Interventions Patient Tolerated: Well Instructions Provided: Adjustment of device, Care of device  Charline Bills Blaise Grieshaber 11/01/2021, 9:34 AM Applied cam walker and delivered crutches after receiver phone order from [employer]

## 2021-11-01 NOTE — Anesthesia Postprocedure Evaluation (Signed)
Anesthesia Post Note  Patient: Leslie Griffin  Procedure(s) Performed: LEFT TALONAVICULAR AND SUBTALAR FUSION (Left: Foot)     Patient location during evaluation: PACU Anesthesia Type: Regional and General Level of consciousness: awake and alert Pain management: pain level controlled Vital Signs Assessment: post-procedure vital signs reviewed and stable Respiratory status: spontaneous breathing, nonlabored ventilation, respiratory function stable and patient connected to nasal cannula oxygen Cardiovascular status: blood pressure returned to baseline and stable Postop Assessment: no apparent nausea or vomiting Anesthetic complications: no   No notable events documented.  Last Vitals:  Vitals:   11/01/21 0910 11/01/21 0925  BP: 110/74 96/60  Pulse: 81 83  Resp: 12 18  Temp:  36.4 C  SpO2: 100% 93%    Last Pain:  Vitals:   11/01/21 0925  TempSrc:   PainSc: 0-No pain                 March Rummage Jesus Poplin

## 2021-11-01 NOTE — H&P (Signed)
Leslie Griffin is an 70 y.o. female.   Chief Complaint: Pain with posterior tibial tendon insufficiency. HPI: Patient is a 70 year old woman who presents with progressive deformity and pain of both feet and ankles.  She states she was evaluated several years ago in [place] by an orthopedic surgeon who recommended amputation.  She has used arch supports orthopedic shoes without relief.  Patient states that she has difficulty walking the dog and difficulty shopping she has to lean over on the cart.  She is the caregiver for her husband.  Past Medical History:  Diagnosis Date   Allergic rhinitis    Anxiety    Arthritis    Family history of colonic polyps    Fibromyalgia    Heart murmur    History of colonic polyps    Hypercholesterolemia    Hypertension    Mitral valve prolapse    Ocular migraine    Palpitations     Past Surgical History:  Procedure Laterality Date   ABDOMINAL HYSTERECTOMY     age was in her 35s   COLONOSCOPY  10/05/2017   Mild sigmoid diverticulosis. Otherwise normal colonoscopy.    cyst removal arm Left 1981   from cat scratch fever   EYE SURGERY     bilateral cataract removal    Family History  Problem Relation Age of Onset   Coronary artery disease Other    Heart disease Mother    Heart disease Father    Colon polyps Sister    Other Sister        low hemoglobin   Colon cancer Neg Hx    Esophageal cancer Neg Hx    Rectal cancer Neg Hx    Stomach cancer Neg Hx    Social History:  reports that she has never smoked. She has never used smokeless tobacco. She reports current alcohol use. She reports that she does not use drugs.  Allergies:  Allergies  Allergen Reactions   Benadryl [Diphenhydramine Hcl] Shortness Of Breath and Rash   Cephalexin Shortness Of Breath and Rash    *Keflex   Duloxetine Other (See Comments)    Violent tremors    Penicillins Shortness Of Breath and Rash    Childhood Reaction    Topiramate Shortness Of Breath   Sulfa  Antibiotics Rash    Medications Prior to Admission  Medication Sig Dispense Refill   cholecalciferol (VITAMIN D) 25 MCG (1000 UNIT) tablet Take 1,000 Units by mouth daily.     gabapentin (NEURONTIN) 300 MG capsule Take 300 mg by mouth 2 (two) times daily.     Omega-3 Fatty Acids (FISH OIL) 1200 MG CAPS Take 2,400 mg by mouth daily.     spironolactone (ALDACTONE) 25 MG tablet Take 1 tablet (25 mg total) by mouth daily. 90 tablet 2   vitamin B-12 (CYANOCOBALAMIN) 500 MCG tablet Take 500 mcg by mouth daily.      Results for orders placed or performed during the hospital encounter of 11/01/21 (from the past 48 hour(s))  Glucose, capillary     Status: Abnormal   Collection Time: 11/01/21  5:41 AM  Result Value Ref Range   Glucose-Capillary 187 (H) 70 - 99 mg/dL    Comment: Glucose reference range applies only to samples taken after fasting for at least 8 hours.   No results found.  Review of Systems  All other systems reviewed and are negative.  Blood pressure (!) 150/76, pulse 88, temperature 97.7 F (36.5 C), temperature source Oral, resp. rate 18,  SpO2 97 %. Physical Exam  Patient is alert, oriented, no adenopathy, well-dressed, normal affect, normal respiratory effort. Examination patient has a palpable dorsalis pedis and posterior tibial pulse bilaterally.  She has posterior tibial tendon insufficiency left worse than right with a pronated valgus forefoot.  She has pain to palpation along the medial column pain to palpation of the sinus Tarsi.  Patient has weakness with inversion.  Patient states she is on Neurontin for fibromyalgia. Assessment/Plan 1. Chronic pain of both ankles   2. Bilateral foot pain   3. Posterior tibial tendinitis, left leg   4. Posterior tibial tendinitis, right leg       Plan: Discussed with the patient that her options are either to continue with conservative therapy including ankle stabilizing orthosis versus talonavicular and subtalar fusion.  The foot  fusion should resolve the posterior tibial tendon insufficiency with the arthritis in her ankle unsure if this would could help unload the deforming forces causing the ankle arthritis.  Newt Minion, MD 11/01/2021, 6:36 AM

## 2021-11-01 NOTE — Anesthesia Procedure Notes (Addendum)
Anesthesia Regional Block: Adductor canal block   Pre-Anesthetic Checklist: , timeout performed,  Correct Patient, Correct Site, Correct Laterality,  Correct Procedure, Correct Position, site marked,  Risks and benefits discussed,  Surgical consent,  Pre-op evaluation,  At surgeon's request and post-op pain management  Laterality: Left  Prep: Dura Prep       Needles:  Injection technique: Single-shot  Needle Type: Echogenic Stimulator Needle     Needle Length: 10cm  Needle Gauge: 20     Additional Needles:   Procedures:,,,, ultrasound used (permanent image in chart),,    Narrative:  Start time: 11/01/2021 7:05 AM End time: 11/01/2021 7:08 AM Injection made incrementally with aspirations every 5 mL.  Performed by: Personally  Anesthesiologist: Darral Dash, DO  Additional Notes: Patient identified. Risks/Benefits/Options discussed with patient including but not limited to bleeding, infection, nerve damage, failed block, incomplete pain control. Patient expressed understanding and wished to proceed. All questions were answered. Sterile technique was used throughout the entire procedure. Please see nursing notes for vital signs. Aspirated in 5cc intervals with injection for negative confirmation. Patient was given instructions on fall risk and not to get out of bed. All questions and concerns addressed with instructions to call with any issues or inadequate analgesia.

## 2021-11-01 NOTE — Anesthesia Procedure Notes (Signed)
Procedure Name: LMA Insertion Date/Time: 11/01/2021 7:44 AM Performed by: Annamary Carolin, CRNA Pre-anesthesia Checklist: Patient identified, Emergency Drugs available, Suction available and Patient being monitored Patient Re-evaluated:Patient Re-evaluated prior to induction Oxygen Delivery Method: Circle System Utilized Preoxygenation: Pre-oxygenation with 100% oxygen Induction Type: IV induction Ventilation: Mask ventilation without difficulty LMA: LMA inserted LMA Size: 4.0 Number of attempts: 1 Airway Equipment and Method: Bite block Placement Confirmation: positive ETCO2 Tube secured with: Tape Dental Injury: Teeth and Oropharynx as per pre-operative assessment

## 2021-11-04 ENCOUNTER — Encounter: Payer: Self-pay | Admitting: Orthopedic Surgery

## 2021-11-04 ENCOUNTER — Encounter (HOSPITAL_COMMUNITY): Payer: Self-pay | Admitting: Orthopedic Surgery

## 2021-11-05 ENCOUNTER — Ambulatory Visit (INDEPENDENT_AMBULATORY_CARE_PROVIDER_SITE_OTHER): Payer: Medicare HMO

## 2021-11-05 ENCOUNTER — Ambulatory Visit (INDEPENDENT_AMBULATORY_CARE_PROVIDER_SITE_OTHER): Payer: Medicare HMO | Admitting: Family

## 2021-11-05 ENCOUNTER — Other Ambulatory Visit: Payer: Self-pay

## 2021-11-05 ENCOUNTER — Encounter: Payer: Self-pay | Admitting: Family

## 2021-11-05 DIAGNOSIS — M79672 Pain in left foot: Secondary | ICD-10-CM

## 2021-11-05 NOTE — Progress Notes (Signed)
° °  Post-Op Visit Note   Patient: Leslie Griffin           Date of Birth: 1951-07-19           MRN: [id number] Visit Date: 11/05/2021 PCP: Ailene Ards, NP  Chief Complaint:  Chief Complaint  Patient presents with   Left Foot - Routine Post Op    Left talonavicular subtalar fusion 11/01/21    HPI:  HPI The patient is a 70 year old woman seen status post left talonavicular and subtalar fusion.  She has been ambulating some in the home in her cam walker states she is unable to do her ADLs without ambulating.  Has ordered a kneeling walker or scooter this will come in in 1 week. Ortho Exam On examination of the left foot her incisions are well approximated sutures there is no gaping no drainage no erythema no dehiscence no drainage  Moderate edema  Visit Diagnoses: No diagnosis found.  Plan: Begin daily Dial soap cleansing.  Dry dressing changes.  Discussed importance of nonweightbearing.  Radiographs of the left foot at follow-up  Follow-Up Instructions: No follow-ups on file.   Imaging: No results found.  Orders:  No orders of the defined types were placed in this encounter.  No orders of the defined types were placed in this encounter.    PMFS History: Patient Active Problem List   Diagnosis Date Noted   Posterior tibial tendinitis, left leg    Vitamin B12 deficiency 08/22/2021   HLD (hyperlipidemia) 08/22/2021   HTN (hypertension) 08/22/2021   Past Medical History:  Diagnosis Date   Allergic rhinitis    Anxiety    Arthritis    Family history of colonic polyps    Fibromyalgia    Heart murmur    History of colonic polyps    Hypercholesterolemia    Hypertension    Mitral valve prolapse    Ocular migraine    Palpitations     Family History  Problem Relation Age of Onset   Coronary artery disease Other    Heart disease Mother    Heart disease Father    Colon polyps Sister    Other Sister        low hemoglobin   Colon cancer Neg Hx    Esophageal  cancer Neg Hx    Rectal cancer Neg Hx    Stomach cancer Neg Hx     Past Surgical History:  Procedure Laterality Date   ABDOMINAL HYSTERECTOMY     age was in her 51s   COLONOSCOPY  10/05/2017   Mild sigmoid diverticulosis. Otherwise normal colonoscopy.    cyst removal arm Left 1981   from cat scratch fever   EYE SURGERY     bilateral cataract removal   FOOT ARTHRODESIS Left 11/01/2021   Procedure: LEFT TALONAVICULAR AND SUBTALAR FUSION;  Surgeon: Newt Minion, MD;  Location: [facility];  Service: Orthopedics;  Laterality: Left;   Social History   Occupational History   Not on file  Tobacco Use   Smoking status: Never   Smokeless tobacco: Never  Vaping Use   Vaping Use: Never used  Substance and Sexual Activity   Alcohol use: Yes    Comment: ocassionally    Drug use: No   Sexual activity: Not on file

## 2021-11-13 ENCOUNTER — Telehealth: Payer: Self-pay | Admitting: Orthopedic Surgery

## 2021-11-13 NOTE — Telephone Encounter (Signed)
Pt called stating she had surgery on her ankle and now it won't stop spasming; she would like a CB to be advised of anything she can do to stop this please.   [phone]

## 2021-11-13 NOTE — Telephone Encounter (Signed)
I spoke with Leslie Griffin, I let her know that ankle could be contracting due to not being able to straighten out her ankle due to surgery. She says that she has been taking yellow mustard at night. Has helped some. She was offered low dose muscle relaxer by Junie Panning, NP. She will see how she does tonight and if she changes her mind she will call to let us know.

## 2021-11-15 ENCOUNTER — Other Ambulatory Visit: Payer: Self-pay | Admitting: Orthopedic Surgery

## 2021-11-15 ENCOUNTER — Other Ambulatory Visit: Payer: Self-pay | Admitting: Orthopaedic Surgery

## 2021-11-15 ENCOUNTER — Encounter: Payer: Self-pay | Admitting: Orthopedic Surgery

## 2021-11-15 MED ORDER — OXYCODONE-ACETAMINOPHEN 5-325 MG PO TABS: 1.0000 | ORAL_TABLET | ORAL | 0 refills | Status: DC | PRN

## 2021-11-15 NOTE — Telephone Encounter (Signed)
I called patient 

## 2021-11-22 ENCOUNTER — Ambulatory Visit (INDEPENDENT_AMBULATORY_CARE_PROVIDER_SITE_OTHER): Payer: Medicare HMO

## 2021-11-22 ENCOUNTER — Encounter: Payer: Self-pay | Admitting: Family

## 2021-11-22 ENCOUNTER — Ambulatory Visit (INDEPENDENT_AMBULATORY_CARE_PROVIDER_SITE_OTHER): Payer: Medicare HMO | Admitting: Family

## 2021-11-22 DIAGNOSIS — M79672 Pain in left foot: Secondary | ICD-10-CM

## 2021-11-28 ENCOUNTER — Ambulatory Visit: Payer: Medicare HMO | Admitting: Nurse Practitioner

## 2021-12-04 NOTE — Progress Notes (Signed)
° °  Post-Op Visit Note   Patient: Leslie Griffin           Date of Birth: January 23, 1951           MRN: [id number] Visit Date: 11/22/2021 PCP: Ailene Ards, NP  Chief Complaint: No chief complaint on file.   HPI:  HPI The patient is a 71 year old woman who is seen status post left talonavicular and subtalar fusion she has been in a cam walker. Ortho Exam Incision is well-healed there is no gaping no drainage no erythema no edema sutures harvested today without incident  Visit Diagnoses:  1. Pain in left foot     Plan: Sutures harvested today.  May advance weightbearing in the cam walker  Follow-Up Instructions: No follow-ups on file.   Imaging: No results found.  Orders:  Orders Placed This Encounter  Procedures   XR Foot Complete Left   No orders of the defined types were placed in this encounter.    PMFS History: Patient Active Problem List   Diagnosis Date Noted   Posterior tibial tendinitis, left leg    Vitamin B12 deficiency 08/22/2021   HLD (hyperlipidemia) 08/22/2021   HTN (hypertension) 08/22/2021   Past Medical History:  Diagnosis Date   Allergic rhinitis    Anxiety    Arthritis    Family history of colonic polyps    Fibromyalgia    Heart murmur    History of colonic polyps    Hypercholesterolemia    Hypertension    Mitral valve prolapse    Ocular migraine    Palpitations     Family History  Problem Relation Age of Onset   Coronary artery disease Other    Heart disease Mother    Heart disease Father    Colon polyps Sister    Other Sister        low hemoglobin   Colon cancer Neg Hx    Esophageal cancer Neg Hx    Rectal cancer Neg Hx    Stomach cancer Neg Hx     Past Surgical History:  Procedure Laterality Date   ABDOMINAL HYSTERECTOMY     age was in her 77s   COLONOSCOPY  10/05/2017   Mild sigmoid diverticulosis. Otherwise normal colonoscopy.    cyst removal arm Left 1981   from cat scratch fever   EYE SURGERY     bilateral cataract  removal   FOOT ARTHRODESIS Left 11/01/2021   Procedure: LEFT TALONAVICULAR AND SUBTALAR FUSION;  Surgeon: Newt Minion, MD;  Location: [facility];  Service: Orthopedics;  Laterality: Left;   Social History   Occupational History   Not on file  Tobacco Use   Smoking status: Never   Smokeless tobacco: Never  Vaping Use   Vaping Use: Never used  Substance and Sexual Activity   Alcohol use: Yes    Comment: ocassionally    Drug use: No   Sexual activity: Not on file

## 2021-12-09 ENCOUNTER — Encounter: Payer: Self-pay | Admitting: Orthopedic Surgery

## 2021-12-17 ENCOUNTER — Ambulatory Visit (INDEPENDENT_AMBULATORY_CARE_PROVIDER_SITE_OTHER): Payer: Medicare HMO | Admitting: Orthopedic Surgery

## 2021-12-17 ENCOUNTER — Other Ambulatory Visit: Payer: Self-pay

## 2021-12-17 DIAGNOSIS — M76822 Posterior tibial tendinitis, left leg: Secondary | ICD-10-CM

## 2021-12-18 ENCOUNTER — Encounter: Payer: Self-pay | Admitting: Orthopedic Surgery

## 2021-12-18 NOTE — Progress Notes (Signed)
Office Visit Note   Patient: Leslie Griffin           Date of Birth: [birth year]           MRN: [id number] Visit Date: 12/17/2021              Requested by: Ailene Ards, NP [address] [place],  [place] [address] PCP: Ailene Ards, NP  Chief Complaint  Patient presents with   Left Foot - Routine Post Op    11/01/21 left tal and sub fusion      HPI: Patient is a 71 year old woman who presents 6 weeks status post left subtalar and talonavicular fusion she is currently weightbearing in a cam walker.  Patient states she has periodic's muscle spasms during the day.  Assessment & Plan: Visit Diagnoses:  1. Posterior tibial tendinitis, left leg     Plan: Patient was given a medium compression sock to help with the swelling.  She will continue with the cam walker for support repeat three-view radiographs of the left foot at follow-up.  Follow-Up Instructions: Return in about 4 weeks (around 01/14/2022).   Ortho Exam  Patient is alert, oriented, no adenopathy, well-dressed, normal affect, normal respiratory effort. Examination patient has good range of motion of the ankles she does have some swelling the incisions are well-healed.  Imaging: No results found. No images are attached to the encounter.  Labs: Lab Results  Component Value Date   HGBA1C 5.8 08/22/2021     Lab Results  Component Value Date   ALBUMIN 4.4 08/22/2021   ALBUMIN 4.4 10/26/2019   ALBUMIN 4.5 10/07/2019    No results found for: MG No results found for: VD25OH  No results found for: PREALBUMIN CBC EXTENDED Latest Ref Rng & Units 10/29/2021 08/22/2021 10/26/2019  WBC 4.0 - 10.5 K/uL 5.5 4.6 4.6  RBC 3.87 - 5.11 MIL/uL 4.45 4.50 4.06  HGB 12.0 - 15.0 g/dL 13.4 13.7 12.3  HCT 36.0 - 46.0 % 41.7 40.9 37.8  PLT 150 - 400 K/uL 210 190.0 225.0  NEUTROABS 1.4 - 7.7 K/uL - 2.1 2.7  LYMPHSABS 0.7 - 4.0 K/uL - 1.8 1.5     There is no height or weight on file to calculate BMI.  Orders:  No orders  of the defined types were placed in this encounter.  No orders of the defined types were placed in this encounter.    Procedures: No procedures performed  Clinical Data: No additional findings.  ROS:  All other systems negative, except as noted in the HPI. Review of Systems  Objective: Vital Signs: There were no vitals taken for this visit.  Specialty Comments:  No specialty comments available.  PMFS History: Patient Active Problem List   Diagnosis Date Noted   Posterior tibial tendinitis, left leg    Vitamin B12 deficiency 08/22/2021   HLD (hyperlipidemia) 08/22/2021   HTN (hypertension) 08/22/2021   Past Medical History:  Diagnosis Date   Allergic rhinitis    Anxiety    Arthritis    Family history of colonic polyps    Fibromyalgia    Heart murmur    History of colonic polyps    Hypercholesterolemia    Hypertension    Mitral valve prolapse    Ocular migraine    Palpitations     Family History  Problem Relation Age of Onset   Coronary artery disease Other    Heart disease Mother    Heart disease Father    Colon  polyps Sister    Other Sister        low hemoglobin   Colon cancer Neg Hx    Esophageal cancer Neg Hx    Rectal cancer Neg Hx    Stomach cancer Neg Hx     Past Surgical History:  Procedure Laterality Date   ABDOMINAL HYSTERECTOMY     age was in her 66s   COLONOSCOPY  10/05/2017   Mild sigmoid diverticulosis. Otherwise normal colonoscopy.    cyst removal arm Left 1981   from cat scratch fever   EYE SURGERY     bilateral cataract removal   FOOT ARTHRODESIS Left 11/01/2021   Procedure: LEFT TALONAVICULAR AND SUBTALAR FUSION;  Surgeon: Newt Minion, MD;  Location: [facility];  Service: Orthopedics;  Laterality: Left;   Social History   Occupational History   Not on file  Tobacco Use   Smoking status: Never   Smokeless tobacco: Never  Vaping Use   Vaping Use: Never used  Substance and Sexual Activity   Alcohol use: Yes    Comment:  ocassionally    Drug use: No   Sexual activity: Not on file

## 2022-01-14 ENCOUNTER — Ambulatory Visit (INDEPENDENT_AMBULATORY_CARE_PROVIDER_SITE_OTHER): Payer: Medicare HMO

## 2022-01-14 ENCOUNTER — Ambulatory Visit (INDEPENDENT_AMBULATORY_CARE_PROVIDER_SITE_OTHER): Payer: Medicare HMO | Admitting: Orthopedic Surgery

## 2022-01-14 DIAGNOSIS — M76822 Posterior tibial tendinitis, left leg: Secondary | ICD-10-CM | POA: Diagnosis not present

## 2022-01-14 DIAGNOSIS — M47816 Spondylosis without myelopathy or radiculopathy, lumbar region: Secondary | ICD-10-CM | POA: Diagnosis not present

## 2022-01-14 DIAGNOSIS — M5441 Lumbago with sciatica, right side: Secondary | ICD-10-CM | POA: Diagnosis not present

## 2022-01-14 DIAGNOSIS — G8929 Other chronic pain: Secondary | ICD-10-CM | POA: Diagnosis not present

## 2022-01-28 ENCOUNTER — Encounter: Payer: Self-pay | Admitting: Orthopedic Surgery

## 2022-01-28 NOTE — Progress Notes (Signed)
Office Visit Note   Patient: Leslie Griffin           Date of Birth: 02-04-1951           MRN: [id number] Visit Date: 01/14/2022              Requested by: Elenore Paddy, NP [address] [place],  [place] [address] PCP: Elenore Paddy, NP  Chief Complaint  Patient presents with   Left Foot - Routine Post Op      HPI: Patient is a 71 year old woman who presents 10 weeks status post subtalar and talonavicular fusion for posterior tibial tendon insufficiency.  Assessment & Plan: Visit Diagnoses:  1. Posterior tibial tendinitis, left leg     Plan: Patient will increase her weightbearing as tolerated follow-up in 4 weeks.  Follow-Up Instructions: Return in about 4 weeks (around 02/11/2022).   Ortho Exam  Patient is alert, oriented, no adenopathy, well-dressed, normal affect, normal respiratory effort. Examination the incisions are well-healed there is no cellulitis no drainage no signs of infection.  Imaging: No results found. No images are attached to the encounter.  Labs: Lab Results  Component Value Date   HGBA1C 5.8 08/22/2021     Lab Results  Component Value Date   ALBUMIN 4.4 08/22/2021   ALBUMIN 4.4 10/26/2019   ALBUMIN 4.5 10/07/2019    No results found for: MG No results found for: VD25OH  No results found for: PREALBUMIN CBC EXTENDED Latest Ref Rng & Units 10/29/2021 08/22/2021 10/26/2019  WBC 4.0 - 10.5 K/uL 5.5 4.6 4.6  RBC 3.87 - 5.11 MIL/uL 4.45 4.50 4.06  HGB 12.0 - 15.0 g/dL 19.1 47.8 29.5  HCT 62.1 - 46.0 % 41.7 40.9 37.8  PLT 150 - 400 K/uL 210 190.0 225.0  NEUTROABS 1.4 - 7.7 K/uL - 2.1 2.7  LYMPHSABS 0.7 - 4.0 K/uL - 1.8 1.5     There is no height or weight on file to calculate BMI.  Orders:  Orders Placed This Encounter  Procedures   XR Foot Complete Left   No orders of the defined types were placed in this encounter.    Procedures: No procedures performed  Clinical Data: No additional findings.  ROS:  All other  systems negative, except as noted in the HPI. Review of Systems  Objective: Vital Signs: There were no vitals taken for this visit.  Specialty Comments:  No specialty comments available.  PMFS History: Patient Active Problem List   Diagnosis Date Noted   Posterior tibial tendinitis, left leg    Vitamin B12 deficiency 08/22/2021   HLD (hyperlipidemia) 08/22/2021   HTN (hypertension) 08/22/2021   Past Medical History:  Diagnosis Date   Allergic rhinitis    Anxiety    Arthritis    Family history of colonic polyps    Fibromyalgia    Heart murmur    History of colonic polyps    Hypercholesterolemia    Hypertension    Mitral valve prolapse    Ocular migraine    Palpitations     Family History  Problem Relation Age of Onset   Coronary artery disease Other    Heart disease Mother    Heart disease Father    Colon polyps Sister    Other Sister        low hemoglobin   Colon cancer Neg Hx    Esophageal cancer Neg Hx    Rectal cancer Neg Hx    Stomach cancer Neg Hx  Past Surgical History:  Procedure Laterality Date   ABDOMINAL HYSTERECTOMY     age was in her 30s   COLONOSCOPY  10/05/2017   Mild sigmoid diverticulosis. Otherwise normal colonoscopy.    cyst removal arm Left 1981   from cat scratch fever   EYE SURGERY     bilateral cataract removal   FOOT ARTHRODESIS Left 11/01/2021   Procedure: LEFT TALONAVICULAR AND SUBTALAR FUSION;  Surgeon: Nadara Mustard, MD;  Location: [facility];  Service: Orthopedics;  Laterality: Left;   Social History   Occupational History   Not on file  Tobacco Use   Smoking status: Never   Smokeless tobacco: Never  Vaping Use   Vaping Use: Never used  Substance and Sexual Activity   Alcohol use: Yes    Comment: ocassionally    Drug use: No   Sexual activity: Not on file

## 2022-02-11 ENCOUNTER — Ambulatory Visit (INDEPENDENT_AMBULATORY_CARE_PROVIDER_SITE_OTHER): Payer: Medicare HMO

## 2022-02-11 ENCOUNTER — Ambulatory Visit: Payer: Medicare HMO | Admitting: Orthopedic Surgery

## 2022-02-11 ENCOUNTER — Other Ambulatory Visit: Payer: Self-pay

## 2022-02-11 DIAGNOSIS — M76822 Posterior tibial tendinitis, left leg: Secondary | ICD-10-CM

## 2022-02-11 DIAGNOSIS — M79672 Pain in left foot: Secondary | ICD-10-CM | POA: Diagnosis not present

## 2022-02-13 ENCOUNTER — Encounter: Payer: Self-pay | Admitting: Orthopedic Surgery

## 2022-02-18 ENCOUNTER — Encounter: Payer: Self-pay | Admitting: Orthopedic Surgery

## 2022-02-18 NOTE — Progress Notes (Signed)
? ?Office Visit Note ?  ?Patient: Leslie Griffin           ?Date of Birth: Sep 05, 1951           ?MRN: [id number] ?Visit Date: 02/11/2022 ?             ?Requested by: Ailene Ards, NP ?[place],  [place] [address] ?PCP: Ailene Ards, NP ? ?Chief Complaint  ?Patient presents with  ? Left Foot - Follow-up  ?  F/u PTTI hx left talonavicular and subtalar fusion 11/01/21  ? ? ? ? ?HPI: ?Patient is a 71 year old woman who is 3-1/2 months status post subtalar and talonavicular fusion.  Patient complains of swelling around the ankle.  She has used ibuprofen without relief. ? ?Assessment & Plan: ?Visit Diagnoses:  ?1. Pain in left foot   ?2. Posterior tibial tendinitis, left leg   ? ? ?Plan: Left ankle was injected she tolerated this well.  Reevaluate at follow-up to see if most of her symptoms are due to impingement in the ankle. ? ?Follow-Up Instructions: Return in about 4 weeks (around 03/11/2022).  ? ?Ortho Exam ? ?Patient is alert, oriented, no adenopathy, well-dressed, normal affect, normal respiratory effort. ?Examination patient's foot is plantigrade she has pain to palpation anteriorly over the ankle she does have swelling over the posterior tibial tendon.  The midfoot is not tender to palpation there is no redness or cellulitis the incisions are well-healed.  There is no prominent hardware radiographically. ? ?Imaging: ?No results found. ?No images are attached to the encounter. ? ?Labs: ?Lab Results  ?Component Value Date  ? HGBA1C 5.8 08/22/2021  ? ? ? ?Lab Results  ?Component Value Date  ? ALBUMIN 4.4 08/22/2021  ? ALBUMIN 4.4 10/26/2019  ? ALBUMIN 4.5 10/07/2019  ? ? ?No results found for: MG ?No results found for: VD25OH ? ?No results found for: PREALBUMIN ? ?  Latest Ref Rng & Units 10/29/2021  ?  9:41 AM 08/22/2021  ?  9:21 AM 10/26/2019  ? 11:33 AM  ?CBC EXTENDED  ?WBC 4.0 - 10.5 K/uL 5.5   4.6   4.6    ?RBC 3.87 - 5.11 MIL/uL 4.45   4.50   4.06    ?Hemoglobin 12.0 - 15.0 g/dL 13.4   13.7   12.3     ?HCT 36.0 - 46.0 % 41.7   40.9   37.8    ?Platelets 150 - 400 K/uL 210   190.0   225.0    ?NEUT# 1.4 - 7.7 K/uL  2.1   2.7    ?Lymph# 0.7 - 4.0 K/uL  1.8   1.5    ? ? ? ?There is no height or weight on file to calculate BMI. ? ?Orders:  ?Orders Placed This Encounter  ?Procedures  ? XR Foot Complete Left  ? ?No orders of the defined types were placed in this encounter. ? ? ? Procedures: ?No procedures performed ? ?Clinical Data: ?No additional findings. ? ?ROS: ? ?All other systems negative, except as noted in the HPI. ?Review of Systems ? ?Objective: ?Vital Signs: There were no vitals taken for this visit. ? ?Specialty Comments:  ?No specialty comments available. ? ?PMFS History: ?Patient Active Problem List  ? Diagnosis Date Noted  ? Posterior tibial tendinitis, left leg   ? Vitamin B12 deficiency 08/22/2021  ? HLD (hyperlipidemia) 08/22/2021  ? HTN (hypertension) 08/22/2021  ? ?Past Medical History:  ?Diagnosis Date  ? Allergic rhinitis   ? Anxiety   ?  Arthritis   ? Family history of colonic polyps   ? Fibromyalgia   ? Heart murmur   ? History of colonic polyps   ? Hypercholesterolemia   ? Hypertension   ? Mitral valve prolapse   ? Ocular migraine   ? Palpitations   ?  ?Family History  ?Problem Relation Age of Onset  ? Coronary artery disease Other   ? Heart disease Mother   ? Heart disease Father   ? Colon polyps Sister   ? Other Sister   ?     low hemoglobin  ? Colon cancer Neg Hx   ? Esophageal cancer Neg Hx   ? Rectal cancer Neg Hx   ? Stomach cancer Neg Hx   ?  ?Past Surgical History:  ?Procedure Laterality Date  ? ABDOMINAL HYSTERECTOMY    ? age was in her 12s  ? COLONOSCOPY  10/05/2017  ? Mild sigmoid diverticulosis. Otherwise normal colonoscopy.   ? cyst removal arm Left 1981  ? from cat scratch fever  ? EYE SURGERY    ? bilateral cataract removal  ? FOOT ARTHRODESIS Left 11/01/2021  ? Procedure: LEFT TALONAVICULAR AND SUBTALAR FUSION;  Surgeon: Newt Minion, MD;  Location: [facility];  Service: Orthopedics;   Laterality: Left;  ? ?Social History  ? ?Occupational History  ? Not on file  ?Tobacco Use  ? Smoking status: Never  ? Smokeless tobacco: Never  ?Vaping Use  ? Vaping Use: Never used  ?Substance and Sexual Activity  ? Alcohol use: Yes  ?  Comment: ocassionally   ? Drug use: No  ? Sexual activity: Not on file  ? ? ? ? ? ?

## 2022-03-11 ENCOUNTER — Ambulatory Visit: Payer: Medicare HMO | Admitting: Orthopedic Surgery

## 2022-03-11 DIAGNOSIS — M79671 Pain in right foot: Secondary | ICD-10-CM | POA: Diagnosis not present

## 2022-03-11 DIAGNOSIS — M79672 Pain in left foot: Secondary | ICD-10-CM

## 2022-03-11 DIAGNOSIS — M76822 Posterior tibial tendinitis, left leg: Secondary | ICD-10-CM | POA: Diagnosis not present

## 2022-03-11 MED ORDER — PREDNISONE 10 MG PO TABS: 10.0000 mg | ORAL_TABLET | Freq: Every day | ORAL | 1 refills | Status: DC

## 2022-03-12 ENCOUNTER — Encounter: Payer: Self-pay | Admitting: Orthopedic Surgery

## 2022-03-12 NOTE — Progress Notes (Signed)
? ?Office Visit Note ?  ?Patient: Leslie Griffin           ?Date of Birth: 08/03/1951           ?MRN: [id number] ?Visit Date: 03/11/2022 ?             ?Requested by: Ailene Ards, NP ?[place],  [place] [address] ?PCP: Ailene Ards, NP ? ?Chief Complaint  ?Patient presents with  ? Left Ankle - Pain, Follow-up  ? ? ? ? ?HPI: ?Patient is a 10 woman who presents with persistent left foot and ankle pain.  Patient is status post a left ankle injection 1 month ago.  Patient states she had no relief with the injection.  She states that yesterday she could barely walk.  Patient states she has had global pain for over 30 years including back pain.  She states most the pain is anteriorly over the ankle.  She states she has had no relief with wearing [employer].  She states that her foot feels better with less stiff sneakers. ? ?Patient is 4 months status post subtalar and talonavicular fusion.  Patient states she does not have pain over the peroneals or posterior tibial tendon. ? ?Assessment & Plan: ?Visit Diagnoses:  ?1. Pain in left foot   ?2. Posterior tibial tendinitis, left leg   ?3. Bilateral foot pain   ? ? ?Plan: With her global pain and no focal etiology of her pain we will try low-dose prednisone 10 mg with breakfast.  Patient is currently on Neurontin 300 mg 3 times a day. ? ?Follow-Up Instructions: Return in about 4 weeks (around 04/08/2022).  ? ?Ortho Exam ? ?Patient is alert, oriented, no adenopathy, well-dressed, normal affect, normal respiratory effort. ?Examination patient has good pulses.  She has global pain around the foot and ankle with no focal findings.  She states she also has back pain.  She complains of a painful bony spur at the base of the first metatarsal and this may be some of the bone graft from the fusion.  Radiographs show stable subtalar talonavicular fusion with no lucency around the hardware no hardware failure.  The ankle is minimally tender to palpation the peroneal  tendons are nontender to palpation the posterior tibial tendon is not tender to palpation.  Discussed that if the pain was coming from the ankle we could proceed with arthroscopy but she had no relief with a steroid injection.  Clinically she is not having pain from the subtalar and talonavicular fusion. ? ?Imaging: ?No results found. ?No images are attached to the encounter. ? ?Labs: ?Lab Results  ?Component Value Date  ? HGBA1C 5.8 08/22/2021  ? ? ? ?Lab Results  ?Component Value Date  ? ALBUMIN 4.4 08/22/2021  ? ALBUMIN 4.4 10/26/2019  ? ALBUMIN 4.5 10/07/2019  ? ? ?No results found for: MG ?No results found for: VD25OH ? ?No results found for: PREALBUMIN ? ?  Latest Ref Rng & Units 10/29/2021  ?  9:41 AM 08/22/2021  ?  9:21 AM 10/26/2019  ? 11:33 AM  ?CBC EXTENDED  ?WBC 4.0 - 10.5 K/uL 5.5   4.6   4.6    ?RBC 3.87 - 5.11 MIL/uL 4.45   4.50   4.06    ?Hemoglobin 12.0 - 15.0 g/dL 13.4   13.7   12.3    ?HCT 36.0 - 46.0 % 41.7   40.9   37.8    ?Platelets 150 - 400 K/uL 210  190.0   225.0    ?NEUT# 1.4 - 7.7 K/uL  2.1   2.7    ?Lymph# 0.7 - 4.0 K/uL  1.8   1.5    ? ? ? ?There is no height or weight on file to calculate BMI. ? ?Orders:  ?No orders of the defined types were placed in this encounter. ? ?Meds ordered this encounter  ?Medications  ? predniSONE (DELTASONE) 10 MG tablet  ?  Sig: Take 1 tablet (10 mg total) by mouth daily with breakfast.  ?  Dispense:  30 tablet  ?  Refill:  1  ? ? ? Procedures: ?No procedures performed ? ?Clinical Data: ?No additional findings. ? ?ROS: ? ?All other systems negative, except as noted in the HPI. ?Review of Systems ? ?Objective: ?Vital Signs: There were no vitals taken for this visit. ? ?Specialty Comments:  ?No specialty comments available. ? ?PMFS History: ?Patient Active Problem List  ? Diagnosis Date Noted  ? Posterior tibial tendinitis, left leg   ? Vitamin B12 deficiency 08/22/2021  ? HLD (hyperlipidemia) 08/22/2021  ? HTN (hypertension) 08/22/2021  ? ?Past Medical  History:  ?Diagnosis Date  ? Allergic rhinitis   ? Anxiety   ? Arthritis   ? Family history of colonic polyps   ? Fibromyalgia   ? Heart murmur   ? History of colonic polyps   ? Hypercholesterolemia   ? Hypertension   ? Mitral valve prolapse   ? Ocular migraine   ? Palpitations   ?  ?Family History  ?Problem Relation Age of Onset  ? Coronary artery disease Other   ? Heart disease Mother   ? Heart disease Father   ? Colon polyps Sister   ? Other Sister   ?     low hemoglobin  ? Colon cancer Neg Hx   ? Esophageal cancer Neg Hx   ? Rectal cancer Neg Hx   ? Stomach cancer Neg Hx   ?  ?Past Surgical History:  ?Procedure Laterality Date  ? ABDOMINAL HYSTERECTOMY    ? age was in her 14s  ? COLONOSCOPY  10/05/2017  ? Mild sigmoid diverticulosis. Otherwise normal colonoscopy.   ? cyst removal arm Left 1981  ? from cat scratch fever  ? EYE SURGERY    ? bilateral cataract removal  ? FOOT ARTHRODESIS Left 11/01/2021  ? Procedure: LEFT TALONAVICULAR AND SUBTALAR FUSION;  Surgeon: Newt Minion, MD;  Location: [facility];  Service: Orthopedics;  Laterality: Left;  ? ?Social History  ? ?Occupational History  ? Not on file  ?Tobacco Use  ? Smoking status: Never  ? Smokeless tobacco: Never  ?Vaping Use  ? Vaping Use: Never used  ?Substance and Sexual Activity  ? Alcohol use: Yes  ?  Comment: ocassionally   ? Drug use: No  ? Sexual activity: Not on file  ? ? ? ? ? ?

## 2022-03-27 ENCOUNTER — Encounter: Payer: Medicare HMO | Admitting: Nurse Practitioner

## 2022-03-31 NOTE — Telephone Encounter (Signed)
error 

## 2022-04-08 ENCOUNTER — Ambulatory Visit: Payer: Medicare HMO | Admitting: Orthopedic Surgery

## 2022-04-10 ENCOUNTER — Ambulatory Visit: Payer: Medicare HMO | Admitting: Orthopedic Surgery

## 2022-04-10 DIAGNOSIS — M76822 Posterior tibial tendinitis, left leg: Secondary | ICD-10-CM | POA: Diagnosis not present

## 2022-04-11 ENCOUNTER — Emergency Department (HOSPITAL_COMMUNITY): Payer: Medicare HMO

## 2022-04-11 ENCOUNTER — Emergency Department (HOSPITAL_COMMUNITY)
Admission: EM | Admit: 2022-04-11 | Discharge: 2022-04-11 | Disposition: A | Discharge status: Home / Self Care | Payer: Medicare HMO | Attending: Emergency Medicine | Admitting: Emergency Medicine

## 2022-04-11 ENCOUNTER — Other Ambulatory Visit: Payer: Self-pay

## 2022-04-11 ENCOUNTER — Ambulatory Visit (INDEPENDENT_AMBULATORY_CARE_PROVIDER_SITE_OTHER): Payer: Medicare HMO | Admitting: Nurse Practitioner

## 2022-04-11 VITALS — BP 145/96 | HR 80 | Temp 98.4°F | Ht 61.0 in | Wt 187.0 lb

## 2022-04-11 DIAGNOSIS — I5030 Unspecified diastolic (congestive) heart failure: Secondary | ICD-10-CM | POA: Diagnosis not present

## 2022-04-11 DIAGNOSIS — R0602 Shortness of breath: Secondary | ICD-10-CM | POA: Insufficient documentation

## 2022-04-11 DIAGNOSIS — R0789 Other chest pain: Secondary | ICD-10-CM | POA: Diagnosis not present

## 2022-04-11 DIAGNOSIS — I110 Hypertensive heart disease with heart failure: Secondary | ICD-10-CM | POA: Insufficient documentation

## 2022-04-11 DIAGNOSIS — R079 Chest pain, unspecified: Secondary | ICD-10-CM

## 2022-04-11 DIAGNOSIS — R12 Heartburn: Secondary | ICD-10-CM | POA: Insufficient documentation

## 2022-04-11 LAB — CBC
HCT: 45.7 % (ref 36.0–46.0)
Hemoglobin: 14.9 g/dL (ref 12.0–15.0)
MCH: 30 pg (ref 26.0–34.0)
MCHC: 32.6 g/dL (ref 30.0–36.0)
MCV: 92.1 fL (ref 80.0–100.0)
Platelets: 243 10*3/uL (ref 150–400)
RBC: 4.96 MIL/uL (ref 3.87–5.11)
RDW: 13.3 % (ref 11.5–15.5)
WBC: 8.5 10*3/uL (ref 4.0–10.5)
nRBC: 0 % (ref 0.0–0.2)

## 2022-04-11 LAB — TROPONIN I (HIGH SENSITIVITY)
Troponin I (High Sensitivity): 6 ng/L (ref <18)
Troponin I (High Sensitivity): 5 ng/L (ref <18)

## 2022-04-11 LAB — BASIC METABOLIC PANEL
Anion gap: 13 (ref 5–15)
BUN: 14 mg/dL (ref 8–23)
CO2: 21 mmol/L — ABNORMAL LOW (ref 22–32)
Calcium: 9.5 mg/dL (ref 8.9–10.3)
Chloride: 102 mmol/L (ref 98–111)
Creatinine, Ser: 0.85 mg/dL (ref 0.44–1.00)
GFR, Estimated: >60 mL/min (ref >60)
Glucose, Bld: 98 mg/dL (ref 70–99)
Potassium: 3.5 mmol/L (ref 3.5–5.1)
Sodium: 136 mmol/L (ref 135–145)

## 2022-04-11 LAB — TSH: TSH: 2.003 u[IU]/mL (ref 0.350–4.500)

## 2022-04-11 LAB — D-DIMER, QUANTITATIVE: D-Dimer, Quant: 0.6 ug/mL-FEU — ABNORMAL HIGH (ref 0.00–0.50)

## 2022-04-11 NOTE — ED Notes (Signed)
Patient transported to X-ray 

## 2022-04-11 NOTE — Progress Notes (Signed)
Subjective:  Patient ID: Leslie Griffin, female    DOB: [birth year]  Age: 71 y.o. MRN: [id number]  CC:  Chief Complaint  Patient presents with   Chest Pain      HPI  This patient arrives today for the above.  She was originally scheduled for annual physical exam however visit was changed to acute visit due to complaints of chest pain.  Patient reports that approximately 6 months ago she started experiencing episodes where her heartbeat felt stronger and she could hear her heartbeat.  She reports never having symptoms like this previously.  She is also been having night sweats for about the last year or so.  Triggers include eating (no specific food or food amount, but anytime she eats she experiences the symptoms) and activities such as walking.  Last night, she had significant diaphoresis as well as a crushing stabbing 10 out of 10 chest pain that started in her midsternal chest and radiated outward towards the right and left side.  She also felt like she was having heartburn at that time.  The pain lasted for 2 hours and then subsided.  She reported that she considered calling 911, but ended up choosing not to call 911.  She has a significant family history for coronary artery disease.  She reports that both her mother and father had myocardial infarction's.  She also reports that her brother has undergone CABG and has had myocardial infarction.   Past Medical History:  Diagnosis Date   Allergic rhinitis    Anxiety    Arthritis    Family history of colonic polyps    Fibromyalgia    Heart murmur    History of colonic polyps    Hypercholesterolemia    Hypertension    Mitral valve prolapse    Ocular migraine    Palpitations       Family History  Problem Relation Age of Onset   Coronary artery disease Other    Heart disease Mother    Heart disease Father    Colon polyps Sister    Other Sister        low hemoglobin   Colon cancer Neg Hx    Esophageal cancer Neg Hx     Rectal cancer Neg Hx    Stomach cancer Neg Hx     Social History   Social History Narrative   Not on file   Social History   Tobacco Use   Smoking status: Never   Smokeless tobacco: Never  Substance Use Topics   Alcohol use: Yes    Comment: ocassionally      Current Meds  Medication Sig   cholecalciferol (VITAMIN D) 25 MCG (1000 UNIT) tablet Take 1,000 Units by mouth daily.   gabapentin (NEURONTIN) 300 MG capsule Take 300 mg by mouth 2 (two) times daily.   Omega-3 Fatty Acids (FISH OIL) 1200 MG CAPS Take 2,400 mg by mouth daily.   predniSONE (DELTASONE) 10 MG tablet Take 1 tablet (10 mg total) by mouth daily with breakfast.   spironolactone (ALDACTONE) 25 MG tablet Take 1 tablet (25 mg total) by mouth daily.    ROS:  See HPI   Objective:   Today's Vitals: BP (!) 145/96 (BP Location: Left Arm, Patient Position: Sitting, Cuff Size: Normal)   Pulse 80   Temp 98.4 F (36.9 C) (Oral)   Ht '5\' 1"'$  (1.549 m)   Wt 187 lb (84.8 kg)   SpO2 97%   BMI 35.33 kg/m  04/11/2022   10:23 AM 11/01/2021    9:25 AM 11/01/2021    9:10 AM  Vitals with BMI  Height '5\' 1"'$     Weight 187 lbs    BMI 81.27    Systolic 517 96 001  Diastolic 96 60 74  Pulse 80 83 81     Physical Exam Vitals reviewed.  Constitutional:      General: She is not in acute distress.    Appearance: Normal appearance. She is not diaphoretic.  HENT:     Head: Normocephalic and atraumatic.  Cardiovascular:     Rate and Rhythm: Normal rate and regular rhythm.     Pulses: Normal pulses.  Pulmonary:     Effort: Pulmonary effort is normal.  Skin:    General: Skin is warm and dry.  Neurological:     General: No focal deficit present.     Mental Status: She is alert and oriented to person, place, and time.  Psychiatric:        Mood and Affect: Mood normal.        Behavior: Behavior normal.        Judgment: Judgment normal.      EKG: Normal sinus rhythm with rate of 62-71.  Some evidence of LVH.   T wave inversion noted in lead aVL as well as possibly V1.   Assessment and Plan   1. Chest pain, unspecified type      Plan: Significant concern for acute coronary syndrome.  Patient told to proceed to the emergency department for emergent evaluation.  Patient is agreeable to this however would prefer to drive herself, offered to call 911 but patient declined.  Annual physical rescheduled and patient is proceeding to emergency department now.  Referral to cardiology ordered for outpatient evaluation and management when she is discharged from emergency department.   Tests ordered Orders Placed This Encounter  Procedures   Ambulatory referral to Cardiology   EKG 12-Lead      No orders of the defined types were placed in this encounter.   Patient to follow-up in 1 month as scheduled, or sooner as needed.  Ailene Ards, NP

## 2022-04-11 NOTE — ED Triage Notes (Signed)
Pt woke up at 0230 this morning with severe sob and chest pain. Sob somewhat resolved but still has chest pain and heaviness. Went to PCP for a physical today and was directed to the ED.

## 2022-04-11 NOTE — ED Notes (Signed)
DC instructions reviewed with pt. PT verbalized understanding. PT DC °

## 2022-04-11 NOTE — Discharge Instructions (Addendum)
You have been evaluated for your chest pain.  Fortunately no concerning finding was noted during today's exam.  Your primary care provider have placed a  referral to cardiology and they will likely contact you in the next 1 to 2 days to schedule an outpatient evaluation.  If you do not hear from them, use number below to contact for outpatient follow-up.  Return if you have any concerns or if symptoms worsen.

## 2022-04-11 NOTE — Patient Instructions (Signed)
[facility]: [address], [place], [place] [address]  [facility]:  [place], [place], [place] [address]

## 2022-04-11 NOTE — ED Provider Notes (Signed)
[facility] EMERGENCY DEPARTMENT Provider Note   CSN: [id number] Arrival date & time: 04/11/22  1127     History  Chief Complaint  Patient presents with   Chest Pain   Shortness of Breath    Leslie Griffin is a 71 y.o. female.  The history is provided by the patient and medical records. No language interpreter was used.  Chest Pain Associated symptoms: shortness of breath   Shortness of Breath Associated symptoms: chest pain    71 year old female significant history of hypertension, anxiety, cardiomyopathy, diastolic heart failure, fibromyalgia sent here from PCP office for evaluation of chest pain.  Patient report waking up this morning having body sweats, having pain across her chest that radiates to her back and having some shortness of breath.  Symptom has been persisting but is improving without fully resolved.  She initially thought she may have had heartburn but states that it felt more severe than typical heartburn.  She denies any specific treatment tried.  She reports she has had night sweats for the past year.  She denies any recent cardiac work-up or stress test.  No prior history of PE or DVT.  She denies alcohol or tobacco abuse.  She was seen by her PCP for complaint however she was told that her EKG is abnormal and she needs to come to the ER for further assessment.  Patient arrived here by private vehicle.  She denies eating anything abnormal yesterday.  Home Medications Prior to Admission medications   Medication Sig Start Date End Date Taking? Authorizing Provider  cholecalciferol (VITAMIN D) 25 MCG (1000 UNIT) tablet Take 1,000 Units by mouth daily.    [provider]  co-enzyme Q-10 30 MG capsule Take by mouth.    [provider]  cyanocobalamin 1000 MCG tablet Take 1 tablet by mouth daily.    [provider]  ferrous sulfate 325 (65 FE) MG tablet Take 1 tablet by mouth daily.    [provider]  gabapentin  (NEURONTIN) 300 MG capsule Take 300 mg by mouth 2 (two) times daily. 08/02/19   [provider]  Omega-3 Fatty Acids (FISH OIL) 1200 MG CAPS Take 2,400 mg by mouth daily.    [provider]  oxyCODONE-acetaminophen (PERCOCET/ROXICET) 5-325 MG tablet Take 1 tablet by mouth every 4 (four) hours as needed. 11/15/21   Marybelle Killings, MD  predniSONE (DELTASONE) 10 MG tablet Take 1 tablet (10 mg total) by mouth daily with breakfast. 03/11/22   Newt Minion, MD  spironolactone (ALDACTONE) 25 MG tablet Take 1 tablet (25 mg total) by mouth daily. 09/09/21   Ailene Ards, NP      Allergies    Benadryl [diphenhydramine hcl], Cephalexin, Duloxetine, Penicillins, Topiramate, and Sulfa antibiotics    Review of Systems   Review of Systems  Respiratory:  Positive for shortness of breath.   Cardiovascular:  Positive for chest pain.  All other systems reviewed and are negative.  Physical Exam Updated Vital Signs BP (!) 161/82 (BP Location: Right Arm)   Pulse 97   Temp 98.5 F (36.9 C) (Oral)   Resp 18   SpO2 97%  Physical Exam Vitals and nursing note reviewed.  Constitutional:      General: She is not in acute distress.    Appearance: She is well-developed.  HENT:     Head: Atraumatic.  Eyes:     Conjunctiva/sclera: Conjunctivae normal.  Pulmonary:     Effort: Pulmonary effort is normal.  Chest:     Chest wall: No tenderness.  Musculoskeletal:     Cervical back: Neck supple.     Right lower leg: No edema.     Left lower leg: No edema.  Skin:    Findings: No rash.  Neurological:     Mental Status: She is alert.  Psychiatric:        Mood and Affect: Mood normal.    ED Results / Procedures / Treatments   Labs (all labs ordered are listed, but only abnormal results are displayed) Labs Reviewed  BASIC METABOLIC PANEL - Abnormal; Notable for the following components:      Result Value   CO2 21 (*)    All other components within normal limits  D-DIMER, QUANTITATIVE -  Abnormal; Notable for the following components:   D-Dimer, Quant 0.60 (*)    All other components within normal limits  CBC  TSH  TROPONIN I (HIGH SENSITIVITY)  TROPONIN I (HIGH SENSITIVITY)    EKG EKG Interpretation  Date/Time:  Friday Apr 11 2022 11:34:28 EDT Ventricular Rate:  110 PR Interval:  166 QRS Duration: 82 QT Interval:  336 QTC Calculation: 454 R Axis:   -31 Text Interpretation: Sinus tachycardia Right atrial enlargement Left axis deviation Possible Anterior infarct , age undetermined Abnormal ECG When compared with ECG of 29-Oct-2021 09:54, PREVIOUS ECG IS PRESENT Confirmed by Lavenia Atlas [id number]) on 04/11/2022 1:05:46 PM  Radiology DG Chest 2 View  Result Date: 04/11/2022 CLINICAL DATA:  cp EXAM: CHEST - 2 VIEW COMPARISON:  Apr 15, 2014 FINDINGS: The heart size and mediastinal contours are within normal limits. Both lungs are clear. Again seen is the moderate elevation of the right hemidiaphragm. The visualized skeletal structures are unremarkable. IMPRESSION: Elevation of the right hemidiaphragm.  Lungs are clear. Electronically Signed   By: Frazier Richards M.D.   On: 04/11/2022 12:11    Procedures Procedures    Medications Ordered in ED Medications - No data to display  ED Course/ Medical Decision Making/ A&P                           Medical Decision Making Amount and/or Complexity of Data Reviewed Labs: ordered. Radiology: ordered.   BP 130/84   Pulse 82   Temp 98.5 F (36.9 C) (Oral)   Resp 14   SpO2 98%   45:15 PM  71 year old female with history of cardiomyopathy, hypertension, hyperlipidemia, fibromyalgia, depression, here with complaints of chest pain and shortness of breath that started earlier this morning.  No specific treatment tried however patient was seen by her PCP and was recommended to come to ER for further assessment.  She denies any recent cardiac stress test.  She is currently resting comfortably in no acute discomfort.  She does  not have any recent producible chest wall pain.  Work-up initiated.  2:32 PM Labs EKG and imaging independently viewed and interpreted by me and I agree with radiologist amputation.  Currently TSH is pending, will follow-up.  Her D-dimer is mildly elevated at 0.60 however after age adjust it is within normal limits therefore I have low suspicion for PE and will not perform chest CTA.  Initial troponin is normal, awaiting delta troponin.  Normal electrolyte panels, normal WBC, normal H&H, and chest x-ray shows elevations of the right hemidiaphragm however lungs are clear.   Pt's HEAR score is 5, which puts her at a moderate risk of MACE.  3:05 PM Negative  delta troponin, patient remains symptom-free.  I did discuss with her options of being admitted to the hospital for further cardiac work-up versus close outpatient follow-up.  Patient preference is to follow-up outpatient.  I will initiate a cardiology ambulatory referral and give patient appropriate return precaution and she agrees with plan.  Care discussed with Dr. Dina Rich   This patient presents to the ED for concern of chest pain, this involves an extensive number of treatment options, and is a complaint that carries with it a high risk of complications and morbidity.  The differential diagnosis includes ACS, GERD, gastritis, PE, costochondritis, PNA, CHF exacerbation  Co morbidities that complicate the patient evaluation HTN  HLD  Cardiomyopathy  Additional history obtained:  Additional history obtained from patient External records from outside source obtained and reviewed including notes from PCP from today  Lab Tests:  I Ordered, and personally interpreted labs.  The pertinent results include:  as above  Imaging Studies ordered:  I ordered imaging studies including CXR I independently visualized and interpreted imaging which showed no acute finding I agree with the radiologist interpretation  Cardiac Monitoring:  The  patient was maintained on a cardiac monitor.  I personally viewed and interpreted the cardiac monitored which showed an underlying rhythm of: NSR  Medicines ordered and prescription drug management:   Test Considered: chest CTA but minimally elevated d-dimer, low suspicion for PE    Problem List / ED Course: chest pain  SOB  Reevaluation:  After the interventions noted above, I reevaluated the patient and found that they have :resolved  Social Determinants of Health: none  Dispostion:  After consideration of the diagnostic results and the patients response to treatment, I feel that the patent would benefit from outpt f/u with cardiology.        Final Clinical Impression(s) / ED Diagnoses Final diagnoses:  Atypical chest pain    Rx / DC Orders ED Discharge Orders     None         Domenic Moras, PA-C 04/11/22 1510    Horton, Alvin Critchley, DO 04/12/22 2115

## 2022-04-13 ENCOUNTER — Encounter: Payer: Self-pay | Admitting: Nurse Practitioner

## 2022-04-17 ENCOUNTER — Ambulatory Visit (INDEPENDENT_AMBULATORY_CARE_PROVIDER_SITE_OTHER): Payer: Medicare HMO | Admitting: Nurse Practitioner

## 2022-04-17 ENCOUNTER — Encounter: Payer: Self-pay | Admitting: Nurse Practitioner

## 2022-04-17 ENCOUNTER — Ambulatory Visit (INDEPENDENT_AMBULATORY_CARE_PROVIDER_SITE_OTHER): Payer: Medicare HMO

## 2022-04-17 ENCOUNTER — Encounter: Payer: Self-pay | Admitting: Orthopedic Surgery

## 2022-04-17 VITALS — BP 120/78 | HR 96 | Temp 98.2°F | Ht 61.0 in | Wt 188.4 lb

## 2022-04-17 DIAGNOSIS — M25512 Pain in left shoulder: Secondary | ICD-10-CM | POA: Insufficient documentation

## 2022-04-17 DIAGNOSIS — R011 Cardiac murmur, unspecified: Secondary | ICD-10-CM

## 2022-04-17 DIAGNOSIS — M19012 Primary osteoarthritis, left shoulder: Secondary | ICD-10-CM | POA: Diagnosis not present

## 2022-04-17 MED ORDER — TRAMADOL HCL 50 MG PO TABS: 50.0000 mg | ORAL_TABLET | Freq: Two times a day (BID) | ORAL | 0 refills | Status: DC | PRN

## 2022-04-17 NOTE — Progress Notes (Signed)
Office Visit Note   Patient: Leslie Griffin           Date of Birth: 03/28/1951           MRN: [id number] Visit Date: 04/10/2022              Requested by: Ailene Ards, NP [address] [place],  [place] [address] PCP: Ailene Ards, NP  Chief Complaint  Patient presents with   Left Ankle - Pain, Follow-up      HPI: Patient is a 71 year old woman who presents in follow-up for her left foot and ankle.  Patient had global pain.  She was treated with prednisone and had no relief with an injection.  Patient states she just returned from a cruise to [place].  She does have a history of fibromyalgia.  Assessment & Plan: Visit Diagnoses:  1. Posterior tibial tendinitis, left leg     Plan: Patient continues to make progress and improve her activities.  She currently uses a cane occasionally.  She will work on strengthening and range of motion.  Follow-Up Instructions: Return if symptoms worsen or fail to improve.   Ortho Exam  Patient is alert, oriented, no adenopathy, well-dressed, normal affect, normal respiratory effort. Examination patient has no redness or swelling no hypersensitivity to light touch of the left foot and ankle.  The peroneal and posterior tibial tendons are nontender to palpation.  She has good motor strength.  No dystrophic changes.  Her pain is resolving.  Imaging: No results found. No images are attached to the encounter.  Labs: Lab Results  Component Value Date   HGBA1C 5.8 08/22/2021     Lab Results  Component Value Date   ALBUMIN 4.4 08/22/2021   ALBUMIN 4.4 10/26/2019   ALBUMIN 4.5 10/07/2019    No results found for: MG No results found for: VD25OH  No results found for: PREALBUMIN    Latest Ref Rng & Units 04/11/2022   11:36 AM 10/29/2021    9:41 AM 08/22/2021    9:21 AM  CBC EXTENDED  WBC 4.0 - 10.5 K/uL 8.5   5.5   4.6    RBC 3.87 - 5.11 MIL/uL 4.96   4.45   4.50    Hemoglobin 12.0 - 15.0 g/dL 14.9   13.4   13.7    HCT 36.0 -  46.0 % 45.7   41.7   40.9    Platelets 150 - 400 K/uL 243   210   190.0    NEUT# 1.4 - 7.7 K/uL   2.1    Lymph# 0.7 - 4.0 K/uL   1.8       There is no height or weight on file to calculate BMI.  Orders:  No orders of the defined types were placed in this encounter.  No orders of the defined types were placed in this encounter.    Procedures: No procedures performed  Clinical Data: No additional findings.  ROS:  All other systems negative, except as noted in the HPI. Review of Systems  Objective: Vital Signs: There were no vitals taken for this visit.  Specialty Comments:  No specialty comments available.  PMFS History: Patient Active Problem List   Diagnosis Date Noted   Posterior tibial tendinitis, left leg    Vitamin B12 deficiency 08/22/2021   HLD (hyperlipidemia) 08/22/2021   HTN (hypertension) 08/22/2021   Arthropathy of lumbar facet joint 11/12/2020   Chest pain 06/14/2020   Dissociative episodes 06/14/2020   Laceration  of right index finger without foreign body without damage to nail 04/03/2020   Generalized abdominal pain 03/13/2020   Low serum iron 10/03/2019   Low vitamin D level 10/03/2019   Familial hypercholesterolemia 06/27/2019   Generalized pain 30/16/0109   Diastolic dysfunction, left ventricle 08/18/2018   Family history of macular degeneration 08/18/2018   GERD (gastroesophageal reflux disease) 08/18/2018   History of cataract surgery 08/18/2018   History of migraine 08/18/2018   Hypertensive cardiomyopathy, without heart failure ([place]) 08/18/2018   Cardiomyopathy ([place]) 02/03/2018   Left-sided low back pain without sciatica 12/04/2017   Fatigue 12/04/2017   Anxiety 11/04/2017   Arthralgia 11/04/2017   History of attempted suicide 11/04/2017   Insomnia 11/04/2017   Past Medical History:  Diagnosis Date   Allergic rhinitis    Anxiety    Arthritis    Family history of colonic polyps    Fibromyalgia    Heart murmur    History of  colonic polyps    Hypercholesterolemia    Hypertension    Mitral valve prolapse    Ocular migraine    Palpitations     Family History  Problem Relation Age of Onset   Coronary artery disease Other    Heart disease Mother    Heart disease Father    Colon polyps Sister    Other Sister        low hemoglobin   Colon cancer Neg Hx    Esophageal cancer Neg Hx    Rectal cancer Neg Hx    Stomach cancer Neg Hx     Past Surgical History:  Procedure Laterality Date   ABDOMINAL HYSTERECTOMY     age was in her 56s   COLONOSCOPY  10/05/2017   Mild sigmoid diverticulosis. Otherwise normal colonoscopy.    cyst removal arm Left 1981   from cat scratch fever   EYE SURGERY     bilateral cataract removal   FOOT ARTHRODESIS Left 11/01/2021   Procedure: LEFT TALONAVICULAR AND SUBTALAR FUSION;  Surgeon: Newt Minion, MD;  Location: [place];  Service: Orthopedics;  Laterality: Left;   Social History   Occupational History   Not on file  Tobacco Use   Smoking status: Never   Smokeless tobacco: Never  Vaping Use   Vaping Use: Never used  Substance and Sexual Activity   Alcohol use: Yes    Comment: ocassionally    Drug use: No   Sexual activity: Not on file

## 2022-04-17 NOTE — Progress Notes (Signed)
Established Patient Office Visit  Subjective   Patient ID: Leslie Griffin, female    DOB: [birth year]  Age: 71 y.o. MRN: [id number]  Chief Complaint  Patient presents with   Shoulder Pain    Pain level is 8 Stabbing constant pain sometime it throb and is causing numbness in her arm and fingers Has tried arthr meds and creams    Shoulder Pain  The pain is present in the left shoulder, left fingers and left arm. This is a new problem. The current episode started in the past 7 days. There has been no history of extremity trauma. The problem occurs constantly. The problem has been gradually worsening. The quality of the pain is described as aching (tingling). The pain is at a severity of 10/10. The pain is severe. Associated symptoms include an inability to bear weight, numbness and tingling. Pertinent negatives include no fever, itching, joint locking or stiffness. The symptoms are aggravated by activity and lying down. She has tried OTC ointments, OTC pain meds and rest for the symptoms. The treatment provided no relief. Family history includes rheumatoid arthritis. There is no history of diabetes, gout, osteoarthritis or rheumatoid arthritis.   Past Medical History:  Diagnosis Date   Allergic rhinitis    Anxiety    Arthritis    Family history of colonic polyps    Fibromyalgia    Heart murmur    History of colonic polyps    Hypercholesterolemia    Hypertension    Mitral valve prolapse    Ocular migraine    Palpitations       Review of Systems  Constitutional:  Positive for diaphoresis. Negative for chills, fever and weight loss.       Nocturnal diaphoresis  HENT:  Negative for congestion, sinus pain and tinnitus.   Eyes:  Positive for blurred vision and double vision.       Visual snow  Respiratory:  Positive for shortness of breath.        SOB after she eats  Cardiovascular:  Positive for chest pain and palpitations.       CP and palpitations after she eats  Gastrointestinal:   Negative for diarrhea and nausea.  Genitourinary: Negative.   Musculoskeletal:  Negative for back pain, falls, gout, neck pain and stiffness.  Skin:  Negative for itching.  Neurological:  Positive for tingling, numbness and headaches. Negative for dizziness, sensory change, speech change, focal weakness and weakness.  Psychiatric/Behavioral:  The patient is nervous/anxious.      Objective:     BP 120/78 (BP Location: Left Arm, Patient Position: Sitting, Cuff Size: Normal)   Pulse 96   Temp 98.2 F (36.8 C) (Oral)   Ht '5\' 1"'$  (1.549 m)   Wt 188 lb 6.4 oz (85.5 kg)   SpO2 98%   BMI 35.60 kg/m  BP Readings from Last 3 Encounters:  04/17/22 120/78  04/11/22 137/79  04/11/22 (!) 145/96   Wt Readings from Last 3 Encounters:  04/17/22 188 lb 6.4 oz (85.5 kg)  04/11/22 187 lb (84.8 kg)  10/29/21 192 lb 14.4 oz (87.5 kg)      Physical Exam Constitutional:      Appearance: Normal appearance.  HENT:     Head: Normocephalic.     Nose: Nose normal.     Mouth/Throat:     Mouth: Mucous membranes are moist.  Pulmonary:     Effort: Pulmonary effort is normal.     Breath sounds: Normal breath sounds.  Musculoskeletal:  General: Tenderness (left shoulder, arm and hand) present.     Cervical back: Normal range of motion.  Skin:      Neurological:     Mental Status: She is alert and oriented to person, place, and time.  Psychiatric:        Mood and Affect: Mood normal.     No results found for any visits on 04/17/22.  Last metabolic panel Lab Results  Component Value Date   GLUCOSE 98 04/11/2022   NA 136 04/11/2022   K 3.5 04/11/2022   CL 102 04/11/2022   CO2 21 (L) 04/11/2022   BUN 14 04/11/2022   CREATININE 0.85 04/11/2022   GFRNONAA >60 04/11/2022   CALCIUM 9.5 04/11/2022   PROT 7.2 08/22/2021   ALBUMIN 4.4 08/22/2021   BILITOT 0.5 08/22/2021   ALKPHOS 70 08/22/2021   AST 16 08/22/2021   ALT 22 08/22/2021   ANIONGAP 13 04/11/2022      The 10-year  ASCVD risk score (Arnett DK, et al., 2019) is: 11.6%    Assessment & Plan:   1.Acute left shoulder pain- probable bursitis Ordered Tramadol '50mg'$  BID PRN for up to 3 days and x-ray of the left shoulder/arm ordered. Further recommendations may be made based on these results.    2.Murmur- Patient reports that she has had mitral valve prolapse since she was a child. Her last Echocardiogram was in 2011. Ordered placed for Echocardiogram for evaluation in anticipation for cardiology eval later this month. Discussed red flag signs and when to proceed to ER. Patient expresses understanding.   Problem List Items Addressed This Visit       Other   Acute pain of left shoulder - Primary   Relevant Medications   traMADol (ULTRAM) 50 MG tablet   Other Relevant Orders   DG Shoulder Left   Murmur   Relevant Orders   ECHOCARDIOGRAM COMPLETE    Return for As scheduled.    [place]ade Nurse, RN

## 2022-04-21 ENCOUNTER — Other Ambulatory Visit: Payer: Self-pay | Admitting: Nurse Practitioner

## 2022-04-21 DIAGNOSIS — M25512 Pain in left shoulder: Secondary | ICD-10-CM

## 2022-04-21 MED ORDER — HYDROCODONE-ACETAMINOPHEN 5-325 MG PO TABS: 1.0000 | ORAL_TABLET | Freq: Four times a day (QID) | ORAL | 0 refills | Status: DC | PRN

## 2022-04-21 NOTE — Progress Notes (Signed)
Patient sent me a MyChart message stating that her pain has not been improved with use of tramadol.  Pain still is occurring in left shoulder.  Discussed situation with supervising physician, and recommendation is to trial Vicodin 1 tablet by mouth every 6 hours as needed as well as to refer to sports medicine for further evaluation.  Main concern is possibly bursitis versus frozen shoulder versus other etiology.  Patient called and notified of plan she is agreeable to this.  [place].  Referral to sports medicine as well as Vicodin prescription sent to pharmacy today.  Patient was encouraged to see sports medicine as soon as possible as certain etiologies of shoulder pain such as frozen shoulder can result in long rehabilitation that should be started sooner rather than later.  She reports understanding.

## 2022-04-27 NOTE — Progress Notes (Unsigned)
Cardiology Office Note:    Date:  04/28/2022   ID:  Leslie Griffin, DOB [birth year], MRN [id number]  PCP:  Ailene Ards, NP  Cardiologist:  None  Electrophysiologist:  None   Referring MD: Ailene Ards, NP   Chief Complaint  Patient presents with   Chest Pain    History of Present Illness:    Leslie Griffin is a 71 y.o. female with a hx of hypertension, hyperlipidemia, fibromyalgia, MVP who is referred by Jeralyn Ruths, NP for evaluation of chest pain.  She was seen in the ED for chest pain on 04/11/2022, work-up unremarkable.  She reports she has been having chest pain off and on since 5/26.  Feels sharp pain in the left center chest, lasts for about 30 minutes or so.  Occurs within 5 minutes of eating.  However has also noted chest pain when exerting herself.  Does report having shortness of breath.  Has also had episodes of lightheadedness but no syncope.  Reports has been having palpitations occurring during episodes as well.  No smoking history.  Family history includes both parents died from MI around age 7, brother had CABG in 29s.  Lexiscan Myoview in 2018 showed normal perfusion.    Past Medical History:  Diagnosis Date   Allergic rhinitis    Anxiety    Arthritis    Family history of colonic polyps    Fibromyalgia    Heart murmur    History of colonic polyps    Hypercholesterolemia    Hypertension    Mitral valve prolapse    Ocular migraine    Palpitations     Past Surgical History:  Procedure Laterality Date   ABDOMINAL HYSTERECTOMY     age was in her 4s   COLONOSCOPY  10/05/2017   Mild sigmoid diverticulosis. Otherwise normal colonoscopy.    cyst removal arm Left 1981   from cat scratch fever   EYE SURGERY     bilateral cataract removal   FOOT ARTHRODESIS Left 11/01/2021   Procedure: LEFT TALONAVICULAR AND SUBTALAR FUSION;  Surgeon: Newt Minion, MD;  Location: [facility];  Service: Orthopedics;  Laterality: Left;    Current Medications: Current Meds   Medication Sig   cholecalciferol (VITAMIN D) 25 MCG (1000 UNIT) tablet Take 1,000 Units by mouth daily.   cyanocobalamin 1000 MCG tablet Take 1 tablet by mouth daily.   ferrous sulfate 325 (65 FE) MG tablet Take 1 tablet by mouth daily.   metoprolol tartrate (LOPRESSOR) 100 MG tablet Take 1 tablet ('100mg'$ ) TWO hours prior to CT scan   Omega-3 Fatty Acids (FISH OIL) 1200 MG CAPS Take 2,400 mg by mouth daily.   omeprazole (PRILOSEC OTC) 20 MG tablet Take 1 tablet (20 mg total) by mouth daily.   rosuvastatin (CRESTOR) 10 MG tablet Take 1 tablet (10 mg total) by mouth daily.   spironolactone (ALDACTONE) 25 MG tablet Take 1 tablet (25 mg total) by mouth daily.     Allergies:   Benadryl [diphenhydramine hcl], Cephalexin, Duloxetine, Penicillins, Topiramate, and Sulfa antibiotics   Social History   Socioeconomic History   Marital status: Married    Spouse name: Not on file   Number of children: 1   Years of education: Not on file   Highest education level: Not on file  Occupational History   Not on file  Tobacco Use   Smoking status: Never   Smokeless tobacco: Never  Vaping Use   Vaping Use: Never used  Substance and Sexual  Activity   Alcohol use: Yes    Comment: ocassionally    Drug use: No   Sexual activity: Not on file  Other Topics Concern   Not on file  Social History Narrative   Not on file   Social Determinants of Health   Financial Resource Strain: Not on file  Food Insecurity: Not on file  Transportation Needs: Not on file  Physical Activity: Not on file  Stress: Not on file  Social Connections: Not on file     Family History: The patient's family history includes Colon polyps in her sister; Coronary artery disease in an other family member; Heart disease in her father and mother; Other in her sister. There is no history of Colon cancer, Esophageal cancer, Rectal cancer, or Stomach cancer.  ROS:   Please see the history of present illness.     All other systems  reviewed and are negative.  EKGs/Labs/Other Studies Reviewed:    The following studies were reviewed today:   EKG:   04/28/2022: Normal sinus rhythm, 89, LVH, Q waves in leads III, aVF, less than 1 mm ST depressions in leads V5/6   Recent Labs: 08/22/2021: ALT 22 04/11/2022: BUN 14; Creatinine, Ser 0.85; Hemoglobin 14.9; Platelets 243; Potassium 3.5; Sodium 136; TSH 2.003  Recent Lipid Panel    Component Value Date/Time   CHOL 256 (H) 08/22/2021 0921   TRIG 124.0 08/22/2021 0921   HDL 65.00 08/22/2021 0921   CHOLHDL 4 08/22/2021 0921   VLDL 24.8 08/22/2021 0921   LDLCALC 166 (H) 08/22/2021 0921    Physical Exam:    VS:  BP 110/68   Pulse 89   Ht '5\' 1"'$  (1.549 m)   Wt 186 lb 9.6 oz (84.6 kg)   SpO2 95%   BMI 35.26 kg/m     Wt Readings from Last 3 Encounters:  04/28/22 186 lb 9.6 oz (84.6 kg)  04/17/22 188 lb 6.4 oz (85.5 kg)  04/11/22 187 lb (84.8 kg)     GEN:  in no acute distress HEENT: Normal NECK: No JVD; No carotid bruits CARDIAC: RRR, 2 out of 6 systolic murmur RESPIRATORY:  Clear to auscultation without rales, wheezing or rhonchi  ABDOMEN: Soft, non-tender, non-distended MUSCULOSKELETAL:  No edema; No deformity  SKIN: Warm and dry NEUROLOGIC:  Alert and oriented x 3 PSYCHIATRIC:  Normal affect   ASSESSMENT:    1. Chest pain of uncertain etiology   2. Palpitations   3. Essential hypertension   4. Hyperlipidemia, unspecified hyperlipidemia type   5. Murmur    PLAN:    Chest pain: Atypical in description but reports can occur with exertion, concerning for possible angina.  Does have significant CAD risk factors (age, hypertension, hyperlipidemia, family history) -Recommend coronary CTA to evaluate for obstructive CAD.  Will give Lopressor 100 mg prior to study -Echocardiogram -Given pain can also occur after eating, could be related to GERD and recommend starting on PPI to see if improvement  Palpitations: Description concerning for arrhythmia, will  evaluate with Zio patch x7 days  Murmur: 2 out of 6 systolic murmur, check echocardiogram as above  Hypertension: On spironolactone 25 mg daily.  Appears controlled.  Hyperlipidemia: LDL 166 on 08/22/2021.  10-year ASCVD risk score 10%.  Recommend starting rosuvastatin 10 mg daily  RTC in 3 months  Medication Adjustments/Labs and Tests Ordered: Current medicines are reviewed at length with the patient today.  Concerns regarding medicines are outlined above.  Orders Placed This Encounter  Procedures  CT CORONARY MORPH W/CTA COR W/SCORE W/CA W/CM &/OR WO/CM   LONG TERM MONITOR (3-14 DAYS)   EKG 12-Lead   Meds ordered this encounter  Medications   rosuvastatin (CRESTOR) 10 MG tablet    Sig: Take 1 tablet (10 mg total) by mouth daily.    Dispense:  90 tablet    Refill:  3   metoprolol tartrate (LOPRESSOR) 100 MG tablet    Sig: Take 1 tablet ('100mg'$ ) TWO hours prior to CT scan    Dispense:  1 tablet    Refill:  0   omeprazole (PRILOSEC OTC) 20 MG tablet    Sig: Take 1 tablet (20 mg total) by mouth daily.    Dispense:  30 tablet    Refill:  3    Patient Instructions  Medication Instructions:  START rosuvastatin (Crestor) 10 mg daily START omeprazole 20 mg daily  Take metoprolol 100 mg TWO hours prior to CT scan  *If you need a refill on your cardiac medications before your next appointment, please call your pharmacy*  Testing/Procedures: Coronary CTA-see instructions below  ZIO XT- Long Term Monitor Instructions   Your physician has requested you wear a ZIO patch monitor for _7_ days.  This is a single patch monitor.   [employer] supplies one patch monitor per enrollment. Additional stickers are not available. Please do not apply patch if you will be having a Nuclear Stress Test, Echocardiogram, Cardiac CT, MRI, or Chest Xray during the period you would be wearing the monitor. The patch cannot be worn during these tests. You cannot remove and re-apply the ZIO XT patch monitor.   Your ZIO patch monitor will be sent Fed Ex from [employer] directly to your home address. It may take 3-5 days to receive your monitor after you have been enrolled.  Once you have received your monitor, please review the enclosed instructions. Your monitor has already been registered assigning a specific monitor serial # to you.  Billing and Patient Assistance Program Information   We have supplied [employer] with any of your insurance information on file for billing purposes. [employer] offers a sliding scale Patient Assistance Program for patients that do not have insurance, or whose insurance does not completely cover the cost of the ZIO monitor.   You must apply for the Patient Assistance Program to qualify for this discounted rate.     To apply, please call [employer] at [phone], select option 4, then select option 2, and ask to apply for Patient Assistance Program.  Theodore Demark will ask your household income, and how many people are in your household.  They will quote your out-of-pocket cost based on that information.  [employer] will also be able to set up a 103-month interest-free payment plan if needed.  Applying the monitor   Shave hair from upper left chest.  Hold abrader disc by orange tab. Rub abrader in 40 strokes over the upper left chest as indicated in your monitor instructions.  Clean area with 4 enclosed alcohol pads. Let dry.  Apply patch as indicated in monitor instructions. Patch will be placed under collarbone on left side of chest with arrow pointing upward.  Rub patch adhesive wings for 2 minutes. Remove white label marked "1". Remove the white label marked "2". Rub patch adhesive wings for 2 additional minutes.  While looking in a mirror, press and release button in center of patch. A small green light will flash 3-4 times. This will be your only indicator that the monitor has been  turned on. ?  Do not shower for the first 24 hours. You may shower after the first 24 hours.   Press the button if you feel a symptom. You will hear a small click. Record Date, Time and Symptom in the Patient Logbook.  When you are ready to remove the patch, follow instructions on the last 2 pages of the Patient Logbook. Stick patch monitor onto the last page of Patient Logbook.  Place Patient Logbook in the blue and white box.  Use locking tab on box and tape box closed securely.  The blue and white box has prepaid postage on it. Please place it in the mailbox as soon as possible. Your physician should have your test results approximately 7 days after the monitor has been mailed back to [facility].  Call [place] at [phone] if you have questions regarding your ZIO XT patch monitor. Call them immediately if you see an orange light blinking on your monitor.  If your monitor falls off in less than 4 days, contact our Monitor department at [phone]. ?If your monitor becomes loose or falls off after 4 days call [employer] at [phone] for suggestions on securing your monitor.?   Follow-Up: At [facility], you and your health needs are our priority.  As part of our continuing mission to provide you with exceptional heart care, we have created designated Provider Care Teams.  These Care Teams include your primary Cardiologist (physician) and Advanced Practice Providers (APPs -  Physician Assistants and Nurse Practitioners) who all work together to provide you with the care you need, when you need it.  We recommend signing up for the patient portal called "MyChart".  Sign up information is provided on this After Visit Summary.  MyChart is used to connect with patients for Virtual Visits (Telemedicine).  Patients are able to view lab/test results, encounter notes, upcoming appointments, etc.  Non-urgent messages can be sent to your provider as well.   To learn more about what you can do with MyChart, go to [email or web address].    Your next appointment:   3  month(s)  The format for your next appointment:   In Person  Provider:   Dr. Gardiner Rhyme  Other Instructions   Your cardiac CT will be scheduled at one of the below locations:   [facility] [address] [place], [place] [address] [phone]  If scheduled at [facility], please arrive at the [facility] and Children's Entrance (Entrance C2) of [facility] 30 minutes prior to test start time. You can use the FREE valet parking offered at entrance C (encouraged to control the heart rate for the test)  Proceed to the [facility] Radiology Department (first floor) to check-in and test prep.  All radiology patients and guests should use entrance C2 at [facility], accessed from [facility], even though the hospital's physical address listed is [address]    Please follow these instructions carefully (unless otherwise directed):   On the Night Before the Test: Be sure to Drink plenty of water. Do not consume any caffeinated/decaffeinated beverages or chocolate 12 hours prior to your test. Do not take any antihistamines 12 hours prior to your test.  On the Day of the Test: Drink plenty of water until 1 hour prior to the test. Do not eat any food 4 hours prior to the test. You may take your regular medications prior to the test.  Take metoprolol (Lopressor) two hours prior  to test. FEMALES- please wear underwire-free bra if available, avoid dresses & tight clothing      After the Test: Drink plenty of water. After receiving IV contrast, you may experience a mild flushed feeling. This is normal. On occasion, you may experience a mild rash up to 24 hours after the test. This is not dangerous. If this occurs, you can take Benadryl 25 mg and increase your fluid intake. If you experience trouble breathing, this can be serious. If it is severe call 911 IMMEDIATELY. If it is mild, please call our office.  We will call to  schedule your test 2-4 weeks out understanding that some insurance companies will need an authorization prior to the service being performed.   For non-scheduling related questions, please contact the cardiac imaging nurse navigator should you have any questions/concerns: Marchia Bond, Cardiac Imaging Nurse Navigator Gordy Clement, Cardiac Imaging Nurse Navigator [facility] Direct Office Dial: [phone]   For scheduling needs, including cancellations and rescheduling, please call [place], [phone].          Signed, Donato Heinz, MD  04/28/2022 10:53 AM    [place]

## 2022-04-28 ENCOUNTER — Encounter: Payer: Self-pay | Admitting: Cardiology

## 2022-04-28 ENCOUNTER — Ambulatory Visit (INDEPENDENT_AMBULATORY_CARE_PROVIDER_SITE_OTHER): Payer: Medicare HMO

## 2022-04-28 ENCOUNTER — Ambulatory Visit: Payer: Medicare HMO | Admitting: Cardiology

## 2022-04-28 VITALS — BP 110/68 | HR 89 | Ht 61.0 in | Wt 186.6 lb

## 2022-04-28 DIAGNOSIS — R002 Palpitations: Secondary | ICD-10-CM

## 2022-04-28 DIAGNOSIS — R011 Cardiac murmur, unspecified: Secondary | ICD-10-CM | POA: Diagnosis not present

## 2022-04-28 DIAGNOSIS — E785 Hyperlipidemia, unspecified: Secondary | ICD-10-CM

## 2022-04-28 DIAGNOSIS — R079 Chest pain, unspecified: Secondary | ICD-10-CM | POA: Diagnosis not present

## 2022-04-28 DIAGNOSIS — I10 Essential (primary) hypertension: Secondary | ICD-10-CM

## 2022-04-28 MED ORDER — OMEPRAZOLE MAGNESIUM 20 MG PO TBEC
20.0000 mg | DELAYED_RELEASE_TABLET | Freq: Every day | ORAL | 3 refills | Status: DC
Start: 2022-04-28 — End: 2022-08-26

## 2022-04-28 MED ORDER — METOPROLOL TARTRATE 100 MG PO TABS: ORAL_TABLET | ORAL | 0 refills | Status: DC

## 2022-04-28 MED ORDER — ROSUVASTATIN CALCIUM 10 MG PO TABS: 10.0000 mg | ORAL_TABLET | Freq: Every day | ORAL | 3 refills | Status: DC

## 2022-04-28 NOTE — Progress Notes (Signed)
Subjective:    CC: L shoulder and arm pain  I, Leslie Griffin, LAT, ATC, am serving as scribe for Dr. Lynne Griffin.  HPI: Pt is a 71 y/o LHD female presenting w/ c/o L shoulder and arm pain x approximately 2 weeks (04/12/22) w/ no known MOI.  She locates her pain to her L post-lat shoulder w/ radiating pain into her entire L arm all the way down to her L fingers.  Neck pain: no Radiating pain: yes in her entire L arm all the way to her fingers L UE paresthesias: yes in L arm and fingers; Aggravating factors: any attempt at L shoulder AROM Treatments tried: OTC pain meds and topicals; Tramadol; oxycodone  Diagnostic testing: L shoulder XR- 04/17/22  Pertinent review of Systems: no fever or chills  Relevant historical information: Hypertension.  Cardiomyopathy.   Objective:    Vitals:   04/29/22 0800  BP: 140/86  Pulse: 70  SpO2: 96%   General: Well Developed, well nourished, and in no acute distress.   MSK: C-spine: Nontender midline. Normal cervical motion. Negative Spurling's test. Reflexes intact upper extremity. Strength decreased left arm abduction otherwise intact upper extremity bilaterally. Sensation is intact distally.  Left shoulder: Normal-appearing Nontender. Range of motion forward flexion 90 degrees.  Abduction full with pain. External rotation full. Internal rotation lumbar spine. Strength abduction 4/5.  External rotation 4+/5 internal rotation 4+/5. Positive Hawkins and Neer's test. Positive mildly Yergason's and speeds test. Positive empty can test.  Lab and Radiology Results  Procedure: Real-time Ultrasound Guided Injection of left shoulder subacromial bursa Device: Philips Affiniti 50G Images permanently stored and available for review in PACS Ultrasound evaluation prior to injection reveals enlarged biceps tendon. Intact subscapularis tendon. Intact supraspinatus tendon with moderate subdeltoid and subacromial bursitis. Intact  infraspinatus tendon. Degenerative appearing AC joint with effusion. Verbal informed consent obtained.  Discussed risks and benefits of procedure. Warned about infection, bleeding, hyperglycemia damage to structures among others. Patient expresses understanding and agreement Time-out conducted.   Noted no overlying erythema, induration, or other signs of local infection.   Skin prepped in a sterile fashion.   Local anesthesia: Topical Ethyl chloride.   With sterile technique and under real time ultrasound guidance: 40 mg of Kenalog and 2 mL of Marcaine injected into the subacromial bursa. Fluid seen entering the bursa.   Completed without difficulty   Pain immediately resolved suggesting accurate placement of the medication.   Advised to call if fevers/chills, erythema, induration, drainage, or persistent bleeding.   Images permanently stored and available for review in the ultrasound unit.  Impression: Technically successful ultrasound guided injection.  X-ray images cervical spine obtained today personally and independently interpreted DDD C4-5 C5-6 C6-7.  No acute fractures are present. Await formal radiology review    EXAM: LEFT SHOULDER - 2+ VIEW   COMPARISON:  None Available.   FINDINGS: Mild-to-moderate glenohumeral joint space narrowing. Mild-to-moderate acromioclavicular joint space narrowing and peripheral osteophytosis. A 7 mm calcific density overlies the left axillary soft tissues. No acute fracture or dislocation. Atherosclerotic calcifications within the aortic arch.   IMPRESSION: 1. Mild to moderate glenohumeral and mild acromioclavicular osteoarthritis. 2. Small calcific density overlying the left axillary soft tissues. This is nonspecific. It could represent a partially calcified benign lymph node or sequela of remote soft tissue trauma.     Electronically Signed   By: Yvonne Kendall M.D.   On: 04/18/2022 19:08   I, Leslie Griffin, personally (independently)  visualized and performed the  interpretation of the images attached in this note.    Impression and Recommendations:    Assessment and Plan: 71 y.o. female with left shoulder pain predominantly thought to be due to subacromial bursitis and rotator cuff tendinitis.  She does have some pain radiating down her arm past the elbow to the hand which could be cervical radiculopathy.  However the majority of her pain improved after the subacromial injection indicating that I think most of it is shoulder related.  Plan for injection and PT.  Also will prescribe Lyrica as gabapentin has not been effective for nerve pain in the past.  Recheck in 1 month.  PDMP reviewed during this encounter. Orders Placed This Encounter  Procedures   [place] LIMITED JOINT SPACE STRUCTURES UP LEFT(NO LINKED CHARGES)    Order Specific Question:   Reason for Exam (SYMPTOM  OR DIAGNOSIS REQUIRED)    Answer:   L shoulder pain    Order Specific Question:   Preferred imaging location?    Answer:   [place]   DG Cervical Spine 2 or 3 views    Standing Status:   Future    Number of Occurrences:   1    Standing Expiration Date:   05/29/2022    Order Specific Question:   Reason for Exam (SYMPTOM  OR DIAGNOSIS REQUIRED)    Answer:   L arm pain    Order Specific Question:   Preferred imaging location?    Answer:   [facility]   Ambulatory referral to Physical Therapy    Referral Priority:   Routine    Referral Type:   Physical Medicine    Referral Reason:   Specialty Services Required    Requested Specialty:   Physical Therapy    Number of Visits Requested:   1   Meds ordered this encounter  Medications   pregabalin (LYRICA) 75 MG capsule    Sig: Take 1 capsule (75 mg total) by mouth 2 (two) times daily as needed.    Dispense:  60 capsule    Refill:  3    Discussed warning signs or symptoms. Please see discharge instructions. Patient expresses understanding.   The above documentation  has been reviewed and is accurate and complete Leslie Griffin, M.D.

## 2022-04-28 NOTE — Progress Notes (Unsigned)
Enrolled for [employer] to mail a ZIO XT long term holter monitor to the patients address on file.  

## 2022-04-28 NOTE — Patient Instructions (Addendum)
Medication Instructions:  START rosuvastatin (Crestor) 10 mg daily START omeprazole 20 mg daily  Take metoprolol 100 mg TWO hours prior to CT scan  *If you need a refill on your cardiac medications before your next appointment, please call your pharmacy*  Testing/Procedures: Coronary CTA-see instructions below  ZIO XT- Long Term Monitor Instructions   Your physician has requested you wear a ZIO patch monitor for _7_ days.  This is a single patch monitor.   [employer] supplies one patch monitor per enrollment. Additional stickers are not available. Please do not apply patch if you will be having a Nuclear Stress Test, Echocardiogram, Cardiac CT, MRI, or Chest Xray during the period you would be wearing the monitor. The patch cannot be worn during these tests. You cannot remove and re-apply the ZIO XT patch monitor.  Your ZIO patch monitor will be sent Fed Ex from [employer] directly to your home address. It may take 3-5 days to receive your monitor after you have been enrolled.  Once you have received your monitor, please review the enclosed instructions. Your monitor has already been registered assigning a specific monitor serial # to you.  Billing and Patient Assistance Program Information   We have supplied [employer] with any of your insurance information on file for billing purposes. [employer] offers a sliding scale Patient Assistance Program for patients that do not have insurance, or whose insurance does not completely cover the cost of the ZIO monitor.   You must apply for the Patient Assistance Program to qualify for this discounted rate.     To apply, please call [employer] at [phone], select option 4, then select option 2, and ask to apply for Patient Assistance Program.  Theodore Demark will ask your household income, and how many people are in your household.  They will quote your out-of-pocket cost based on that information.  [employer] will also be able to set up a 77-month interest-free  payment plan if needed.  Applying the monitor   Shave hair from upper left chest.  Hold abrader disc by orange tab. Rub abrader in 40 strokes over the upper left chest as indicated in your monitor instructions.  Clean area with 4 enclosed alcohol pads. Let dry.  Apply patch as indicated in monitor instructions. Patch will be placed under collarbone on left side of chest with arrow pointing upward.  Rub patch adhesive wings for 2 minutes. Remove white label marked "1". Remove the white label marked "2". Rub patch adhesive wings for 2 additional minutes.  While looking in a mirror, press and release button in center of patch. A small green light will flash 3-4 times. This will be your only indicator that the monitor has been turned on. ?  Do not shower for the first 24 hours. You may shower after the first 24 hours.  Press the button if you feel a symptom. You will hear a small click. Record Date, Time and Symptom in the Patient Logbook.  When you are ready to remove the patch, follow instructions on the last 2 pages of the Patient Logbook. Stick patch monitor onto the last page of Patient Logbook.  Place Patient Logbook in the blue and white box.  Use locking tab on box and tape box closed securely.  The blue and white box has prepaid postage on it. Please place it in the mailbox as soon as possible. Your physician should have your test results approximately 7 days after [facility]  Call  [place] at [phone] if you have questions regarding your ZIO XT patch monitor. Call them immediately if you see an orange light blinking on your monitor.  If your monitor falls off in less than 4 days, contact our Monitor department at [phone]. ?If your monitor becomes loose or falls off after 4 days call [employer] at [phone] for suggestions on securing your monitor.?   Follow-Up: At [facility], you and your health needs are our  priority.  As part of our continuing mission to provide you with exceptional heart care, we have created designated Provider Care Teams.  These Care Teams include your primary Cardiologist (physician) and Advanced Practice Providers (APPs -  Physician Assistants and Nurse Practitioners) who all work together to provide you with the care you need, when you need it.  We recommend signing up for the patient portal called "MyChart".  Sign up information is provided on this After Visit Summary.  MyChart is used to connect with patients for Virtual Visits (Telemedicine).  Patients are able to view lab/test results, encounter notes, upcoming appointments, etc.  Non-urgent messages can be sent to your provider as well.   To learn more about what you can do with MyChart, go to [email or web address].    Your next appointment:   3 month(s)  The format for your next appointment:   In Person  Provider:   Dr. Gardiner Rhyme  Other Instructions   Your cardiac CT will be scheduled at one of the below locations:   [facility] [address] [place], [place] [address] [phone]  If scheduled at [facility], please arrive at the [facility] (Entrance C2) of [facility] 30 minutes prior to test start time. You can use the FREE valet parking offered at entrance C (encouraged to control the heart rate for the test)  Proceed to the [facility] Radiology Department (first floor) to check-in and test prep.  All radiology patients and guests should use entrance C2 at [facility], accessed from [facility], even though the hospital's physical address listed is [address].    Please follow these instructions carefully (unless otherwise directed):   On the Night Before the Test: Be sure to Drink plenty of water. Do not consume any caffeinated/decaffeinated beverages or chocolate 12 hours prior to your test. Do not take any  antihistamines 12 hours prior to your test.  On the Day of the Test: Drink plenty of water until 1 hour prior to the test. Do not eat any food 4 hours prior to the test. You may take your regular medications prior to the test.  Take metoprolol (Lopressor) two hours prior to test. FEMALES- please wear underwire-free bra if available, avoid dresses & tight clothing      After the Test: Drink plenty of water. After receiving IV contrast, you may experience a mild flushed feeling. This is normal. On occasion, you may experience a mild rash up to 24 hours after the test. This is not dangerous. If this occurs, you can take Benadryl 25 mg and increase your fluid intake. If you experience trouble breathing, this can be serious. If it is severe call 911 IMMEDIATELY. If it is mild, please call our office.  We will call to schedule your test 2-4 weeks out understanding that some insurance companies will need an authorization prior to the service being performed.   For non-scheduling related questions, please contact the cardiac imaging nurse navigator should  you have any questions/concerns: Marchia Bond, Cardiac Imaging Nurse Navigator Gordy Clement, Cardiac Imaging Nurse Navigator [facility] Direct Office Dial: [phone]   For scheduling needs, including cancellations and rescheduling, please call [place], [phone].

## 2022-04-29 ENCOUNTER — Ambulatory Visit: Payer: Self-pay

## 2022-04-29 ENCOUNTER — Ambulatory Visit (INDEPENDENT_AMBULATORY_CARE_PROVIDER_SITE_OTHER): Payer: Medicare HMO

## 2022-04-29 ENCOUNTER — Encounter: Payer: Self-pay | Admitting: Family Medicine

## 2022-04-29 ENCOUNTER — Ambulatory Visit: Payer: Medicare HMO | Admitting: Family Medicine

## 2022-04-29 VITALS — BP 140/86 | HR 70 | Ht 61.0 in | Wt 187.4 lb

## 2022-04-29 DIAGNOSIS — M25512 Pain in left shoulder: Secondary | ICD-10-CM | POA: Diagnosis not present

## 2022-04-29 DIAGNOSIS — M47812 Spondylosis without myelopathy or radiculopathy, cervical region: Secondary | ICD-10-CM | POA: Diagnosis not present

## 2022-04-29 DIAGNOSIS — M79602 Pain in left arm: Secondary | ICD-10-CM

## 2022-04-29 DIAGNOSIS — M2578 Osteophyte, vertebrae: Secondary | ICD-10-CM | POA: Diagnosis not present

## 2022-04-29 DIAGNOSIS — M4312 Spondylolisthesis, cervical region: Secondary | ICD-10-CM | POA: Diagnosis not present

## 2022-04-29 MED ORDER — PREGABALIN 75 MG PO CAPS: 75.0000 mg | ORAL_CAPSULE | Freq: Two times a day (BID) | ORAL | 3 refills | Status: DC | PRN

## 2022-04-29 NOTE — Patient Instructions (Addendum)
Good to see you today.  You had a L shoulder injection.  Call or go to the ER if you develop a large red swollen joint with extreme pain or oozing puss.   I've referred you to Physical Therapy.  Their office will call you to schedule but please let us know if you don't hear from them in one week regarding scheduling.  I've prescribed you Lyrica.  Follow-up: one month

## 2022-04-30 NOTE — Progress Notes (Signed)
Cervical spine x-rays show some arthritis changes

## 2022-05-01 ENCOUNTER — Encounter: Payer: Self-pay | Admitting: Nurse Practitioner

## 2022-05-01 ENCOUNTER — Ambulatory Visit (INDEPENDENT_AMBULATORY_CARE_PROVIDER_SITE_OTHER): Payer: Medicare HMO | Admitting: Nurse Practitioner

## 2022-05-01 ENCOUNTER — Ambulatory Visit: Payer: Medicare HMO | Admitting: Cardiology

## 2022-05-01 VITALS — BP 140/86 | HR 68 | Temp 98.5°F | Ht 61.0 in | Wt 188.6 lb

## 2022-05-01 DIAGNOSIS — E785 Hyperlipidemia, unspecified: Secondary | ICD-10-CM

## 2022-05-01 DIAGNOSIS — R079 Chest pain, unspecified: Secondary | ICD-10-CM | POA: Diagnosis not present

## 2022-05-01 DIAGNOSIS — M25512 Pain in left shoulder: Secondary | ICD-10-CM | POA: Diagnosis not present

## 2022-05-01 DIAGNOSIS — R7303 Prediabetes: Secondary | ICD-10-CM | POA: Diagnosis not present

## 2022-05-01 DIAGNOSIS — R002 Palpitations: Secondary | ICD-10-CM

## 2022-05-01 DIAGNOSIS — I10 Essential (primary) hypertension: Secondary | ICD-10-CM | POA: Diagnosis not present

## 2022-05-01 NOTE — Patient Instructions (Signed)
Mediterranean Diet ?A Mediterranean diet refers to food and lifestyle choices that are based on the traditions of countries located on the [place] Sea. It focuses on eating more fruits, vegetables, whole grains, beans, nuts, seeds, and heart-healthy fats, and eating less dairy, meat, eggs, and processed foods with added sugar, salt, and fat. This way of eating has been shown to help prevent certain conditions and improve outcomes for people who have chronic diseases, like kidney disease and heart disease. ?What are tips for following this plan? ?Reading food labels ?Check the serving size of packaged foods. For foods such as rice and pasta, the serving size refers to the amount of cooked product, not dry. ?Check the total fat in packaged foods. Avoid foods that have saturated fat or trans fats. ?Check the ingredient list for added sugars, such as corn syrup. ?Shopping ? ?Buy a variety of foods that offer a balanced diet, including: ?Fresh fruits and vegetables (produce). ?Grains, beans, nuts, and seeds. Some of these may be available in unpackaged forms or large amounts (in bulk). ?Fresh seafood. ?Poultry and eggs. ?Low-fat dairy products. ?Buy whole ingredients instead of prepackaged foods. ?Buy fresh fruits and vegetables in-season from local farmers markets. ?Buy plain frozen fruits and vegetables. ?If you do not have access to quality fresh seafood, buy precooked frozen shrimp or canned fish, such as tuna, salmon, or sardines. ?Stock your pantry so you always have certain foods on hand, such as olive oil, canned tuna, canned tomatoes, rice, pasta, and beans. ?Cooking ?Cook foods with extra-virgin olive oil instead of using butter or other vegetable oils. ?Have meat as a side dish, and have vegetables or grains as your main dish. This means having meat in small portions or adding small amounts of meat to foods like pasta or stew. ?Use beans or vegetables instead of meat in common dishes like chili or  lasagna. ?Experiment with different cooking methods. Try roasting, broiling, steaming, and saut?ing vegetables. ?Add frozen vegetables to soups, stews, pasta, or rice. ?Add nuts or seeds for added healthy fats and plant protein at each meal. You can add these to yogurt, salads, or vegetable dishes. ?Marinate fish or vegetables using olive oil, lemon juice, garlic, and fresh herbs. ?Meal planning ?Plan to eat one vegetarian meal one day each week. Try to work up to two vegetarian meals, if possible. ?Eat seafood two or more times a week. ?Have healthy snacks readily available, such as: ?Vegetable sticks with hummus. ?Greek yogurt. ?Fruit and nut trail mix. ?Eat balanced meals throughout the week. This includes: ?Fruit: 2-3 servings a day. ?Vegetables: 4-5 servings a day. ?Low-fat dairy: 2 servings a day. ?Fish, poultry, or lean meat: 1 serving a day. ?Beans and legumes: 2 or more servings a week. ?Nuts and seeds: 1-2 servings a day. ?Whole grains: 6-8 servings a day. ?Extra-virgin olive oil: 3-4 servings a day. ?Limit red meat and sweets to only a few servings a month. ?Lifestyle ? ?Cook and eat meals together with your family, when possible. ?Drink enough fluid to keep your urine pale yellow. ?Be physically active every day. This includes: ?Aerobic exercise like running or swimming. ?Leisure activities like gardening, walking, or housework. ?Get 7-8 hours of sleep each night. ?If recommended by your health care provider, drink red wine in moderation. This means 1 glass a day for nonpregnant women and 2 glasses a day for men. A glass of wine equals 5 oz (150 mL). ?What foods should I eat? ?Fruits ?Apples. Apricots. Avocado. Berries. Bananas. Cherries. Dates.   Figs. Grapes. Lemons. Melon. Oranges. Peaches. Plums. Pomegranate. ?Vegetables ?Artichokes. Beets. Broccoli. Cabbage. Carrots. Eggplant. Green beans. Chard. Kale. Spinach. Onions. Leeks. Peas. Squash. Tomatoes. Peppers. Radishes. ?Grains ?Whole-grain pasta. Brown  rice. Bulgur wheat. Polenta. Couscous. Whole-wheat bread. Oatmeal. Quinoa. ?Meats and other proteins ?Beans. Almonds. Sunflower seeds. Pine nuts. Peanuts. Cod. Salmon. Scallops. Shrimp. Tuna. Tilapia. Clams. Oysters. Eggs. Poultry without skin. ?Dairy ?Low-fat milk. Cheese. Greek yogurt. ?Fats and oils ?Extra-virgin olive oil. Avocado oil. Grapeseed oil. ?Beverages ?Water. Red wine. Herbal tea. ?Sweets and desserts ?Greek yogurt with honey. Baked apples. Poached pears. Trail mix. ?Seasonings and condiments ?Basil. Cilantro. Coriander. Cumin. Mint. Parsley. Sage. Rosemary. Tarragon. Garlic. Oregano. Thyme. Pepper. Balsamic vinegar. Tahini. Hummus. Tomato sauce. Olives. Mushrooms. ?The items listed above may not be a complete list of foods and beverages you can eat. Contact a dietitian for more information. ?What foods should I limit? ?This is a list of foods that should be eaten rarely or only on special occasions. ?Fruits ?Fruit canned in syrup. ?Vegetables ?Deep-fried potatoes (french fries). ?Grains ?Prepackaged pasta or rice dishes. Prepackaged cereal with added sugar. Prepackaged snacks with added sugar. ?Meats and other proteins ?Beef. Pork. Lamb. Poultry with skin. Hot dogs. Bacon. ?Dairy ?Ice cream. Sour cream. Whole milk. ?Fats and oils ?Butter. Canola oil. Vegetable oil. Beef fat (tallow). Lard. ?Beverages ?Juice. Sugar-sweetened soft drinks. Beer. Liquor and spirits. ?Sweets and desserts ?Cookies. Cakes. Pies. Candy. ?Seasonings and condiments ?Mayonnaise. Pre-made sauces and marinades. ?The items listed above may not be a complete list of foods and beverages you should limit. Contact a dietitian for more information. ?Summary ?The Mediterranean diet includes both food and lifestyle choices. ?Eat a variety of fresh fruits and vegetables, beans, nuts, seeds, and whole grains. ?Limit the amount of red meat and sweets that you eat. ?If recommended by your health care provider, drink red wine in moderation.  This means 1 glass a day for nonpregnant women and 2 glasses a day for men. A glass of wine equals 5 oz (150 mL). ?This information is not intended to replace advice given to you by your health care provider. Make sure you discuss any questions you have with your health care provider. ?Document Revised: 12/09/2019 Document Reviewed: 10/06/2019 ?Elsevier Patient Education ? 2023 Elsevier Inc. ? ?

## 2022-05-01 NOTE — Assessment & Plan Note (Signed)
Improved, patient encouraged to undergo echocardiogram and calcium CT score as well as heart monitor.  Patient reports her understanding and plans to do so.  She will continue on omeprazole and rosuvastatin.  Patient will follow-up with cardiology as scheduled.

## 2022-05-01 NOTE — Progress Notes (Signed)
Established Patient Office Visit  Subjective   Patient ID: Leslie Griffin, female    DOB: 10/29/1951  Age: 72 y.o. MRN: [id number]  Chief Complaint  Patient presents with   Hyperlipidemia   Prediabetes    HLD: Patient recently saw cardiology and was started on rosuvastatin.  She tells me she is tolerating medication well.  Last LDL was 166.  ASCVD risk score 15.5.  Prediabetes: A1c 5.8, patient reports trying to follow a healthy lifestyle.  She is not able to exercise routinely due to limitations from foot pain.  Left shoulder pain: She was evaluated by sports medicine and underwent injection to her shoulder earlier this week with improvement in her pain.  She reports she plans on following up with them as scheduled.  Chest Pain: Is being evaluated by cardiology.  She reports she is scheduled for CT calcium score and cardiac echocardiogram.  She is also started omeprazole to see if pain may be improved.  She plans on having heart monitor for 7 days as well.  She denies any chest pain since last office visit with me.    Review of Systems  Constitutional:  Positive for malaise/fatigue.  Respiratory:  Positive for shortness of breath (once or twice).   Cardiovascular:  Negative for chest pain.  Musculoskeletal:  Positive for joint pain. Negative for myalgias.  Neurological:  Positive for dizziness.      Objective:     BP 140/86   Pulse 68   Temp 98.5 F (36.9 C) (Oral)   Ht '5\' 1"'$  (1.549 m)   Wt 188 lb 9.6 oz (85.5 kg)   SpO2 96%   BMI 35.64 kg/m  BP Readings from Last 3 Encounters:  05/01/22 140/86  04/29/22 140/86  04/28/22 110/68   Wt Readings from Last 3 Encounters:  05/01/22 188 lb 9.6 oz (85.5 kg)  04/29/22 187 lb 6.4 oz (85 kg)  04/28/22 186 lb 9.6 oz (84.6 kg)      Physical Exam Vitals reviewed.  Constitutional:      General: She is not in acute distress.    Appearance: Normal appearance.  HENT:     Head: Normocephalic and atraumatic.  Neck:      Vascular: Carotid bruit present.  Cardiovascular:     Rate and Rhythm: Normal rate and regular rhythm.     Pulses: Normal pulses.     Heart sounds: Murmur heard.  Pulmonary:     Effort: Pulmonary effort is normal.     Breath sounds: Normal breath sounds.  Skin:    General: Skin is warm and dry.  Neurological:     General: No focal deficit present.     Mental Status: She is alert and oriented to person, place, and time.  Psychiatric:        Mood and Affect: Mood normal.        Behavior: Behavior normal.        Judgment: Judgment normal.      No results found for any visits on 05/01/22.  Last metabolic panel Lab Results  Component Value Date   GLUCOSE 98 04/11/2022   NA 136 04/11/2022   K 3.5 04/11/2022   CL 102 04/11/2022   CO2 21 (L) 04/11/2022   BUN 14 04/11/2022   CREATININE 0.85 04/11/2022   GFRNONAA >60 04/11/2022   CALCIUM 9.5 04/11/2022   PROT 7.2 08/22/2021   ALBUMIN 4.4 08/22/2021   BILITOT 0.5 08/22/2021   ALKPHOS 70 08/22/2021   AST 16 08/22/2021  ALT 22 08/22/2021   ANIONGAP 13 04/11/2022   Last lipids Lab Results  Component Value Date   CHOL 256 (H) 08/22/2021   HDL 65.00 08/22/2021   LDLCALC 166 (H) 08/22/2021   TRIG 124.0 08/22/2021   CHOLHDL 4 08/22/2021   Last hemoglobin A1c Lab Results  Component Value Date   HGBA1C 5.8 08/22/2021      The 10-year ASCVD risk score (Arnett DK, et al., 2019) is: 15.5% Estimated Creatinine Clearance: 61.2 mL/min (by C-G formula based on SCr of 0.85 mg/dL).    Assessment & Plan:   Problem List Items Addressed This Visit       Cardiovascular and Mediastinum   Hypertension - Primary     Other   HLD (hyperlipidemia)   Chest pain   Acute pain of left shoulder   Prediabetes    Return in about 6 weeks (around 06/12/2022) for Follow-up with Judson Roch.    Ailene Ards, NP

## 2022-05-01 NOTE — Assessment & Plan Note (Signed)
Chronic, stable.  Blood pressure slightly above goal today.  Patient reports she is willing to check blood pressure at home and bring log with her to next office visit.  I am agreeable to this, further recommendations to be made based upon at home readings.

## 2022-05-01 NOTE — Assessment & Plan Note (Signed)
Acute, improved.  Patient encouraged to follow-up with sports medicine as scheduled.

## 2022-05-01 NOTE — Assessment & Plan Note (Signed)
Patient encouraged to focus on healthy lifestyle, and she reports her understanding.  We will monitor periodically.

## 2022-05-01 NOTE — Assessment & Plan Note (Signed)
Chronic, patient to continue on rosuvastatin 10 mg/day.  She will follow-up in 6 weeks for repeat metabolic panel and fasting lipid level.

## 2022-05-03 ENCOUNTER — Encounter: Payer: Self-pay | Admitting: Family Medicine

## 2022-05-03 DIAGNOSIS — M25512 Pain in left shoulder: Secondary | ICD-10-CM

## 2022-05-05 ENCOUNTER — Ambulatory Visit (HOSPITAL_COMMUNITY)
Admission: RE | Admit: 2022-05-05 | Discharge: 2022-05-05 | Disposition: A | Discharge status: Home / Self Care | Payer: Medicare HMO | Source: Ambulatory Visit | Attending: Nurse Practitioner | Admitting: Nurse Practitioner

## 2022-05-05 DIAGNOSIS — R011 Cardiac murmur, unspecified: Secondary | ICD-10-CM | POA: Insufficient documentation

## 2022-05-05 DIAGNOSIS — I10 Essential (primary) hypertension: Secondary | ICD-10-CM | POA: Insufficient documentation

## 2022-05-05 DIAGNOSIS — I080 Rheumatic disorders of both mitral and aortic valves: Secondary | ICD-10-CM | POA: Diagnosis not present

## 2022-05-05 LAB — ECHOCARDIOGRAM COMPLETE
MV M vel: 5.73 m/s
MV Peak grad: 131.3 mmHg
P 1/2 time: 666 msec
S' Lateral: 2.5 cm

## 2022-05-08 ENCOUNTER — Encounter: Payer: Self-pay | Admitting: Cardiology

## 2022-05-12 ENCOUNTER — Other Ambulatory Visit: Payer: Self-pay | Admitting: *Deleted

## 2022-05-12 DIAGNOSIS — I517 Cardiomegaly: Secondary | ICD-10-CM

## 2022-05-12 NOTE — Telephone Encounter (Signed)
See result note.  

## 2022-05-13 ENCOUNTER — Encounter: Payer: Self-pay | Admitting: Physical Therapy

## 2022-05-13 ENCOUNTER — Encounter: Payer: Self-pay | Admitting: Family Medicine

## 2022-05-13 ENCOUNTER — Ambulatory Visit: Payer: Medicare HMO | Attending: Family Medicine | Admitting: Physical Therapy

## 2022-05-13 ENCOUNTER — Ambulatory Visit (INDEPENDENT_AMBULATORY_CARE_PROVIDER_SITE_OTHER): Payer: Medicare HMO | Admitting: Family Medicine

## 2022-05-13 ENCOUNTER — Ambulatory Visit: Payer: Self-pay

## 2022-05-13 ENCOUNTER — Encounter: Payer: Self-pay | Admitting: Cardiology

## 2022-05-13 VITALS — BP 128/72 | Ht 61.0 in | Wt 188.0 lb

## 2022-05-13 DIAGNOSIS — M19012 Primary osteoarthritis, left shoulder: Secondary | ICD-10-CM

## 2022-05-13 DIAGNOSIS — M25612 Stiffness of left shoulder, not elsewhere classified: Secondary | ICD-10-CM | POA: Insufficient documentation

## 2022-05-13 DIAGNOSIS — M25512 Pain in left shoulder: Secondary | ICD-10-CM | POA: Insufficient documentation

## 2022-05-13 DIAGNOSIS — M79602 Pain in left arm: Secondary | ICD-10-CM | POA: Insufficient documentation

## 2022-05-13 DIAGNOSIS — I10 Essential (primary) hypertension: Secondary | ICD-10-CM

## 2022-05-13 MED ORDER — TRIAMCINOLONE ACETONIDE 40 MG/ML IJ SUSP
40.0000 mg | Freq: Once | INTRAMUSCULAR | Status: AC
  Administered 2022-05-13: 40 mg via INTRA_ARTICULAR

## 2022-05-15 DIAGNOSIS — R002 Palpitations: Secondary | ICD-10-CM | POA: Diagnosis not present

## 2022-05-15 NOTE — Telephone Encounter (Signed)
September is okay for the MRI

## 2022-05-16 ENCOUNTER — Telehealth: Payer: Self-pay

## 2022-05-16 NOTE — Telephone Encounter (Signed)
Sent mindful relaxation handout to Mychart.

## 2022-05-16 NOTE — Telephone Encounter (Signed)
Spoke with patient who reported lightheadedness and sometimes SOB. She also described that earlier her whole body was shaking, but it has eased up now. She stated she is under stress because she is worried about her heart. I went to Dr. Sallyanne Kuster (DOD). He reviewed Zio and said it was normal, and that Dr. Gardiner Rhyme will know more about her heart after her CCTA on 7/5. Explained this to the patient and about relaxation techniques and will send a handout to [place]. She will monitor her BP 2 hours after taking spironolactone. Recommended that if she gets lightheaded with sob and chest pain to call 911.

## 2022-05-17 ENCOUNTER — Ambulatory Visit (INDEPENDENT_AMBULATORY_CARE_PROVIDER_SITE_OTHER): Payer: Medicare HMO

## 2022-05-17 DIAGNOSIS — M75122 Complete rotator cuff tear or rupture of left shoulder, not specified as traumatic: Secondary | ICD-10-CM | POA: Diagnosis not present

## 2022-05-17 DIAGNOSIS — M25712 Osteophyte, left shoulder: Secondary | ICD-10-CM | POA: Diagnosis not present

## 2022-05-17 DIAGNOSIS — M19012 Primary osteoarthritis, left shoulder: Secondary | ICD-10-CM | POA: Diagnosis not present

## 2022-05-17 DIAGNOSIS — M25512 Pain in left shoulder: Secondary | ICD-10-CM | POA: Diagnosis not present

## 2022-05-17 DIAGNOSIS — R600 Localized edema: Secondary | ICD-10-CM | POA: Diagnosis not present

## 2022-05-19 ENCOUNTER — Telehealth (HOSPITAL_COMMUNITY): Payer: Self-pay | Admitting: *Deleted

## 2022-05-19 NOTE — Telephone Encounter (Signed)
Recommend checking BP twice daily for next week and can we schedule in pharmacy hypertension clinic in 2 weeks?

## 2022-05-19 NOTE — Telephone Encounter (Signed)
Reaching out to patient to offer assistance regarding upcoming cardiac imaging study; pt verbalizes understanding of appt date/time, parking situation and where to check in, pre-test NPO status and medications ordered, and verified current allergies; name and call back number provided for further questions should they arise  Gordy Clement RN Navigator Cardiac Imaging [facility] [phone] office [phone] cell  Patient to take '100mg'$  metoprolol tartrate two hours prior to cardiac CT scan. She is aware to arrive at 8:30am.

## 2022-05-21 ENCOUNTER — Ambulatory Visit (HOSPITAL_COMMUNITY)
Admission: RE | Admit: 2022-05-21 | Discharge: 2022-05-21 | Disposition: A | Discharge status: Home / Self Care | Payer: Medicare HMO | Source: Ambulatory Visit | Attending: Cardiology | Admitting: Cardiology

## 2022-05-21 DIAGNOSIS — Q245 Malformation of coronary vessels: Secondary | ICD-10-CM | POA: Insufficient documentation

## 2022-05-21 DIAGNOSIS — I2510 Atherosclerotic heart disease of native coronary artery without angina pectoris: Secondary | ICD-10-CM | POA: Diagnosis not present

## 2022-05-21 DIAGNOSIS — R079 Chest pain, unspecified: Secondary | ICD-10-CM | POA: Insufficient documentation

## 2022-05-21 MED ORDER — NITROGLYCERIN 0.4 MG SL SUBL
0.8000 mg | SUBLINGUAL_TABLET | Freq: Once | SUBLINGUAL | Status: AC
  Administered 2022-05-21: 0.8 mg via SUBLINGUAL

## 2022-05-21 MED ORDER — NITROGLYCERIN 0.4 MG SL SUBL
SUBLINGUAL_TABLET | SUBLINGUAL | Status: AC
  Filled 2022-05-21: qty 2

## 2022-05-21 MED ORDER — IOHEXOL 350 MG/ML SOLN
100.0000 mL | Freq: Once | INTRAVENOUS | Status: AC | PRN
  Administered 2022-05-21: 100 mL via INTRAVENOUS

## 2022-05-22 ENCOUNTER — Encounter: Payer: Self-pay | Admitting: Cardiology

## 2022-05-23 NOTE — Addendum Note (Signed)
Addended by: Caprice Beaver T on: 05/23/2022 04:54 PM   Modules accepted: Orders

## 2022-05-26 ENCOUNTER — Encounter: Payer: Self-pay | Admitting: Family Medicine

## 2022-05-26 DIAGNOSIS — M75122 Complete rotator cuff tear or rupture of left shoulder, not specified as traumatic: Secondary | ICD-10-CM

## 2022-05-26 DIAGNOSIS — M25512 Pain in left shoulder: Secondary | ICD-10-CM

## 2022-05-26 NOTE — Progress Notes (Signed)
We have an appointment scheduled on July 13 to talk about your shoulder pain and the results of this MRI.  However you have a pretty bad rotator cuff tear of 2 of the rotator cuff tendons that in my opinion should probably have surgery.  Would you like me to refer you to an orthopedic surgeon to talk about your surgical options? If you would like to see me first on the 13th please keep your appointment and I will talk in great detail about your MRI results.

## 2022-05-26 NOTE — Addendum Note (Signed)
Addended by: Sumner Boast on: 05/26/2022 08:52 AM   Modules accepted: Orders

## 2022-05-28 ENCOUNTER — Ambulatory Visit: Payer: Medicare HMO | Admitting: Orthopedic Surgery

## 2022-05-28 ENCOUNTER — Ambulatory Visit: Payer: Medicare HMO | Admitting: Physical Therapy

## 2022-05-28 DIAGNOSIS — M12812 Other specific arthropathies, not elsewhere classified, left shoulder: Secondary | ICD-10-CM | POA: Diagnosis not present

## 2022-05-29 ENCOUNTER — Encounter: Payer: Self-pay | Admitting: Orthopedic Surgery

## 2022-05-29 ENCOUNTER — Ambulatory Visit: Payer: Medicare HMO | Admitting: Family Medicine

## 2022-05-30 ENCOUNTER — Ambulatory Visit: Payer: Self-pay

## 2022-05-30 ENCOUNTER — Ambulatory Visit (INDEPENDENT_AMBULATORY_CARE_PROVIDER_SITE_OTHER): Payer: Medicare HMO | Admitting: Surgery

## 2022-05-30 ENCOUNTER — Encounter: Payer: Self-pay | Admitting: Surgery

## 2022-05-30 VITALS — BP 138/78 | HR 105 | Ht 61.0 in | Wt 188.0 lb

## 2022-05-30 DIAGNOSIS — M79672 Pain in left foot: Secondary | ICD-10-CM | POA: Diagnosis not present

## 2022-05-31 ENCOUNTER — Encounter: Payer: Self-pay | Admitting: Orthopedic Surgery

## 2022-05-31 NOTE — Progress Notes (Signed)
Office Visit Note   Patient: Leslie Griffin           Date of Birth: [birth year]           MRN: [id number] Visit Date: 05/28/2022 Requested by: Gregor Hams, MD [place],  [place] [id number] PCP: Ailene Ards, NP  Subjective: Chief Complaint  Patient presents with   Left Shoulder - Pain    HPI: Leslie Griffin is a 71 year old patient with bilateral shoulder pain left worse than right.  Pain has been ongoing since 04/12/2022.  Denies any history of injury but does report relatively acute onset of pain in that left shoulder.  She is left-hand dominant.  Reports constant pain which is worse with activity.  She states the pain does not wake her from sleep at night.  Does radiate down to the elbow with some numbness and tingling in the entire hand.  Denies any neck pain but she does report some decreased range of motion.  No prior shoulder surgery.  She has difficulty with ADLs such as cooking cleaning and eating.  Unable to really journal with her dominant left arm and hand without pain.  She has had 2 injections without relief.  She is currently being worked up with cardiology due to issues which have developed in the last few months.  Tried some oxycodone without much relief.  She has an MRI scan which shows retracted supraspinatus and infraspinatus tears with some atrophy present.  Mild biceps tendinopathy present.              ROS: All systems reviewed are negative as they relate to the chief complaint within the history of present illness.  Patient denies  fevers or chills.   Assessment & Plan: Visit Diagnoses:  1. Rotator cuff arthropathy of left shoulder     Plan: Impression is rotator cuff arthropathy in the left shoulder with fairly well-maintained active motion and passive motion but with significant pain.  We discussed how reverse shoulder replacement would be an operation that would primarily be treating her pain has her function currently is fairly reasonable in terms of active  forward flexion and AB duction.  Discussed the risk and benefits of surgical intervention including not limited to infection or vessel damage instability as well as incomplete pain relief and incomplete restoration of desired level of function.  Patient understands and will consider her options.  I think she is likely heading for surgical intervention but we will see her back in 4 weeks with decision for or against reverse replacement at that time.  Follow-Up Instructions: No follow-ups on file.   Orders:  No orders of the defined types were placed in this encounter.  No orders of the defined types were placed in this encounter.     Procedures: No procedures performed   Clinical Data: No additional findings.  Objective: Vital Signs: There were no vitals taken for this visit.  Physical Exam:   Constitutional: Patient appears well-developed HEENT:  Head: Normocephalic Eyes:EOM are normal Neck: Normal range of motion Cardiovascular: Normal rate Pulmonary/chest: Effort normal Neurologic: Patient is alert Skin: Skin is warm Psychiatric: Patient has normal mood and affect   Ortho Exam: Ortho exam demonstrates range of motion on the right of 85/85/165.  On the left motion is 85/90/175.  Active forward flexion on the left is 130 active abduction on the left is 90.  Deltoid is functional.  Coarse grinding is present with internal/external rotation of the shoulder at 90  degrees of abduction.  No discrete AC joint tenderness left versus right.  Cervical spine range of motion is full.  No subluxation of the ulnar nerve in either elbow with flexion.  EPL FPL interosseous strength intact with palpable radial pulse.  No other masses lymphadenopathy or skin changes noted in the left shoulder girdle region.  Specialty Comments:  No specialty comments available.  Imaging: No results found.   PMFS History: Patient Active Problem List   Diagnosis Date Noted   Primary osteoarthritis of left  shoulder 05/13/2022   Prediabetes 05/01/2022   Murmur 04/17/2022   Posterior tibial tendinitis, left leg    HLD (hyperlipidemia) 08/22/2021   Visual snow syndrome 12/30/2020   Arthropathy of lumbar facet joint 11/12/2020   Chest pain 06/14/2020   Dissociative episodes 06/14/2020   Laceration of right index finger without foreign body without damage to nail 04/03/2020   Multiple drug allergies 04/03/2020   B12 deficiency 03/13/2020   Generalized abdominal pain 03/13/2020   Low serum iron 10/03/2019   Low vitamin D level 10/03/2019   Ocular migraine 10/03/2019   Familial hypercholesterolemia 06/27/2019   Upper GI bleed 06/27/2019   Right foot pain 03/27/2019   Generalized pain 10/08/2018   Hypertension 82/42/3536   Diastolic dysfunction, left ventricle 08/18/2018   Family history of macular degeneration 08/18/2018   GERD (gastroesophageal reflux disease) 08/18/2018   History of cataract surgery 08/18/2018   History of migraine 08/18/2018   Hypertensive cardiomyopathy, without heart failure ([place]) 08/18/2018   Medication refill 08/18/2018   Mixed dyslipidemia 08/18/2018   Obesity, Class II, BMI 35-39.9 08/18/2018   Wellness examination 08/18/2018   Cardiomyopathy ([place]) 02/03/2018   Pain and swelling of ankle, right 01/27/2018   Left-sided low back pain without sciatica 12/04/2017   Fatigue 12/04/2017   Anxiety 11/04/2017   Arthralgia 11/04/2017   History of attempted suicide 11/04/2017   Insomnia 11/04/2017   Moderate episode of recurrent major depressive disorder ([place]) 11/04/2017   Past Medical History:  Diagnosis Date   Allergic rhinitis    Anxiety    Arthritis    Family history of colonic polyps    Fibromyalgia    Heart murmur    History of colonic polyps    Hypercholesterolemia    Hypertension    Mitral valve prolapse    Ocular migraine    Palpitations     Family History  Problem Relation Age of Onset   Coronary artery disease Other    Heart disease Mother     Heart disease Father    Colon polyps Sister    Other Sister        low hemoglobin   Colon cancer Neg Hx    Esophageal cancer Neg Hx    Rectal cancer Neg Hx    Stomach cancer Neg Hx     Past Surgical History:  Procedure Laterality Date   ABDOMINAL HYSTERECTOMY     age was in her 43s   COLONOSCOPY  10/05/2017   Mild sigmoid diverticulosis. Otherwise normal colonoscopy.    cyst removal arm Left 1981   from cat scratch fever   EYE SURGERY     bilateral cataract removal   FOOT ARTHRODESIS Left 11/01/2021   Procedure: LEFT TALONAVICULAR AND SUBTALAR FUSION;  Surgeon: Newt Minion, MD;  Location: [facility];  Service: Orthopedics;  Laterality: Left;   Social History   Occupational History   Not on file  Tobacco Use   Smoking status: Never   Smokeless tobacco: Never  Vaping Use   Vaping Use: Never used  Substance and Sexual Activity   Alcohol use: Yes    Comment: ocassionally    Drug use: No   Sexual activity: Not on file

## 2022-06-01 ENCOUNTER — Encounter: Payer: Self-pay | Admitting: Orthopedic Surgery

## 2022-06-02 ENCOUNTER — Ambulatory Visit: Payer: Medicare HMO | Attending: Family Medicine | Admitting: Physical Therapy

## 2022-06-02 ENCOUNTER — Encounter: Payer: Self-pay | Admitting: Physical Therapy

## 2022-06-02 ENCOUNTER — Ambulatory Visit: Payer: Medicare HMO | Admitting: Orthopedic Surgery

## 2022-06-02 DIAGNOSIS — M25612 Stiffness of left shoulder, not elsewhere classified: Secondary | ICD-10-CM | POA: Insufficient documentation

## 2022-06-02 DIAGNOSIS — S92302A Fracture of unspecified metatarsal bone(s), left foot, initial encounter for closed fracture: Secondary | ICD-10-CM | POA: Diagnosis not present

## 2022-06-02 DIAGNOSIS — M79672 Pain in left foot: Secondary | ICD-10-CM

## 2022-06-02 DIAGNOSIS — M25512 Pain in left shoulder: Secondary | ICD-10-CM | POA: Diagnosis not present

## 2022-06-02 NOTE — Therapy (Signed)
OUTPATIENT PHYSICAL THERAPY SHOULDER EVALUATION   Patient Name: Leslie Griffin MRN: [id number] DOB:28-Mar-1951, 71 y.o., female Today's Date: 06/02/2022   PT End of Session - 06/02/22 1024     Visit Number 2    Date for PT Re-Evaluation 08/13/22    Authorization Type [employer]na Medicare    PT Start Time 1015    PT Stop Time 1100    PT Time Calculation (min) 45 min    Activity Tolerance Patient tolerated treatment well    Behavior During Therapy WFL for tasks assessed/performed             Past Medical History:  Diagnosis Date   Allergic rhinitis    Anxiety    Arthritis    Family history of colonic polyps    Fibromyalgia    Heart murmur    History of colonic polyps    Hypercholesterolemia    Hypertension    Mitral valve prolapse    Ocular migraine    Palpitations    Past Surgical History:  Procedure Laterality Date   ABDOMINAL HYSTERECTOMY     age was in her 79s   COLONOSCOPY  10/05/2017   Mild sigmoid diverticulosis. Otherwise normal colonoscopy.    cyst removal arm Left 1981   from cat scratch fever   EYE SURGERY     bilateral cataract removal   FOOT ARTHRODESIS Left 11/01/2021   Procedure: LEFT TALONAVICULAR AND SUBTALAR FUSION;  Surgeon: Newt Minion, MD;  Location: [facility];  Service: Orthopedics;  Laterality: Left;   Patient Active Problem List   Diagnosis Date Noted   Primary osteoarthritis of left shoulder 05/13/2022   Prediabetes 05/01/2022   Murmur 04/17/2022   Posterior tibial tendinitis, left leg    HLD (hyperlipidemia) 08/22/2021   Visual snow syndrome 12/30/2020   Arthropathy of lumbar facet joint 11/12/2020   Chest pain 06/14/2020   Dissociative episodes 06/14/2020   Laceration of right index finger without foreign body without damage to nail 04/03/2020   Multiple drug allergies 04/03/2020   B12 deficiency 03/13/2020   Generalized abdominal pain 03/13/2020   Low serum iron 10/03/2019   Low vitamin D level 10/03/2019   Ocular migraine 10/03/2019    Familial hypercholesterolemia 06/27/2019   Upper GI bleed 06/27/2019   Right foot pain 03/27/2019   Generalized pain 10/08/2018   Hypertension 35/36/1443   Diastolic dysfunction, left ventricle 08/18/2018   Family history of macular degeneration 08/18/2018   GERD (gastroesophageal reflux disease) 08/18/2018   History of cataract surgery 08/18/2018   History of migraine 08/18/2018   Hypertensive cardiomyopathy, without heart failure ([place]) 08/18/2018   Medication refill 08/18/2018   Mixed dyslipidemia 08/18/2018   Obesity, Class II, BMI 35-39.9 08/18/2018   Wellness examination 08/18/2018   Cardiomyopathy ([place]) 02/03/2018   Pain and swelling of ankle, right 01/27/2018   Left-sided low back pain without sciatica 12/04/2017   Fatigue 12/04/2017   Anxiety 11/04/2017   Arthralgia 11/04/2017   History of attempted suicide 11/04/2017   Insomnia 11/04/2017   Moderate episode of recurrent major depressive disorder ([place]) 11/04/2017    PCP: Jeralyn Ruths  REFERRING PROVIDER: [name]  REFERRING DIAG: left shoulder pain  THERAPY DIAG:  Acute pain of left shoulder  Stiffness of left shoulder, not elsewhere classified  Rationale for Evaluation and Treatment Rehabilitation  ONSET DATE: 04/29/22  SUBJECTIVE:  SUBJECTIVE STATEMENT: Patient reports that she is in tremendous pain, reports that she no longer sleeps.  I am in pain all of the time, I can't do any of my normal stuff due to pain.  She reports that the MD feels like TSA  is the best option vs no surgical intervention, reports that she fell last week she reports that she put herself in the boot, hit her left ankle hip and shoulder, x-rays were negative. I don't want to wake up in the morning because I know I am going to hurt all day  PERTINENT  HISTORY: Anxiety, arthritis, fibromyalgia, HTN  PAIN:  Are you having pain? Yes: NPRS scale: 10/10 Pain location: left lateral shoulder left upper arm, c/o some numbness in the fingers Pain description: ache, sharppain >10/10 Aggravating factors: any motions, any movements all ADL's pain >10/10 Relieving factors: reports that nothing has helped the pain, pain at best a 9/10  PRECAUTIONS: None  WEIGHT BEARING RESTRICTIONS No  FALLS:  Has patient fallen in last 6 months? No  LIVING ENVIRONMENT: Lives with: lives with their family Lives in: House/apartment Stairs: No Has following equipment at home: None  OCCUPATION: retired  PLOF: Independent Does the cooking and cleaning, some gardening  PATIENT GOALS have less pain, do better with ADL's  OBJECTIVE:   DIAGNOSTIC FINDINGS:  Degenerative changes of the cervical spine, OA changes of the left shoulder  PATIENT SURVEYS:  FOTO 16  COGNITION:  Overall cognitive status: Within functional limits for tasks assessed     SENSATION: WFL  POSTURE: Fwd head, rounded shoulders  UPPER EXTREMITY ROM: very painful and very limited motions  Active/PROM ROM Left  Eval 05/13/22 Active Left Eval 05/13/22 Passive  Shoulder flexion 60 70  Shoulder extension    Shoulder abduction 30 40  Shoulder adduction    Shoulder internal rotation    Shoulder external rotation    Elbow flexion    Elbow extension    Wrist flexion    Wrist extension    Wrist ulnar deviation    Wrist radial deviation    Wrist pronation    Wrist supination    (Blank rows = not tested)  UPPER EXTREMITY MMT:  all motions very painful, could not do flexion and abduction due to pain  MMT Right eval Left Eval 05/13/22  Shoulder flexion    Shoulder extension    Shoulder abduction    Shoulder adduction    Shoulder internal rotation  4-/5  Shoulder external rotation  3+/5  Middle trapezius    Lower trapezius    Elbow flexion    Elbow extension     Wrist flexion    Wrist extension    Wrist ulnar deviation    Wrist radial deviation    Wrist pronation    Wrist supination    Grip strength (lbs)    (Blank rows = not tested)  SHOULDER SPECIAL TESTS:  Impingement tests: Neer impingement test: positive    Rotator cuff assessment: Drop arm test: positive   Biceps assessment: Yergason's test: positive   PALPATION:  Has a lot of spasms in the left upper traps, sore and tender down the left upper arm   TODAY'S TREATMENT:  06/02/22 STM to the left upper trap, the left shoulder and upper arm PROM of the left shoulder Reviewl of the HEP and performed Vaso to left shoulder 40 degrees F, low pressure IFC with above in sitting   05/13/22  MHP/estim IFC for pain in sitting  PATIENT EDUCATION: Education details: HEP Person educated: Patient Education method: Consulting civil engineer, Media planner, Verbal cues, and Handouts Education comprehension: verbalized understanding   HOME EXERCISE PROGRAM: Thigh slides, shrugs and retractions  ASSESSMENT:  CLINICAL IMPRESSION: Patient reports that she is very miserable in pain, reports constant pain with no relief, She reports doing the exercises at home.  She did have a fall and has a bruise on the left upper arm, reports a bruise on the left foot and ankle and the left hip, she is in a boot, reports that x-rays were negative.  She is suffering from pain and reports that she is over using the right arm due to pain and the right arm is now hurting.   OBJECTIVE IMPAIRMENTS decreased activity tolerance, decreased ROM, decreased strength, increased muscle spasms, impaired flexibility, impaired sensation, impaired UE functional use, improper body mechanics, postural dysfunction, and pain.   ACTIVITY LIMITATIONS carrying, lifting, sleeping, bed mobility, bathing, toileting, dressing, self feeding, reach over head, hygiene/grooming, and caring for others  PARTICIPATION LIMITATIONS: meal prep, cleaning,  laundry, driving, shopping, community activity, and yard work  [employer] POTENTIAL: Good  CLINICAL DECISION MAKING: Stable/uncomplicated  EVALUATION COMPLEXITY: Low   GOALS: Goals reviewed with patient? Yes  SHORT TERM GOALS: Target date: 05/27/22  Independent with initial HEP Goal status: met  LONG TERM GOALS: Target date: 08/04/22  Understand posture and body mechanics Goal status: INITIAL  2.  Decreaes pain 50% Goal status: INITIAL  3.  Increase shoulder AROM to 120 degrees flexion Goal status: INITIAL  4.  Increase shoulder ER to 60 degrees Goal status: INITIAL  5.  Report no difficulty doing hair or dressing Goal status: INITIAL    PLAN: PT FREQUENCY: 1-2x/week  PT DURATION: 12 weeks  PLANNED INTERVENTIONS: Therapeutic exercises, Therapeutic activity, Neuromuscular re-education, Balance training, Gait training, Patient/Family education, Joint manipulation, Joint mobilization, Dry Needling, Electrical stimulation, Cryotherapy, Moist heat, Taping, Ultrasound, and Manual therapy  PLAN FOR NEXT SESSION: See if today's treatment helped, she is in a lot of pain  Idris Edmundson W, PT 06/02/2022, 10:25 AM

## 2022-06-02 NOTE — Telephone Encounter (Signed)
Everest for t 3 1 po q 8 # 30 Rf to xu for second opinion Right shoulder statistically is likely to have same problem as the left just not as advanced  Pls send this as response to her from me thx

## 2022-06-03 DIAGNOSIS — Z961 Presence of intraocular lens: Secondary | ICD-10-CM | POA: Diagnosis not present

## 2022-06-05 ENCOUNTER — Encounter: Payer: Self-pay | Admitting: Physical Therapy

## 2022-06-05 ENCOUNTER — Ambulatory Visit: Payer: Medicare HMO | Admitting: Physical Therapy

## 2022-06-05 DIAGNOSIS — M25512 Pain in left shoulder: Secondary | ICD-10-CM

## 2022-06-05 DIAGNOSIS — M25612 Stiffness of left shoulder, not elsewhere classified: Secondary | ICD-10-CM

## 2022-06-05 NOTE — Therapy (Signed)
OUTPATIENT PHYSICAL THERAPY SHOULDER EVALUATION   Patient Name: Leslie Griffin MRN: [id number] DOB:01-Jan-1951, 71 y.o., female Today's Date: 06/05/2022   PT End of Session - 06/05/22 1106     Visit Number 3    Date for PT Re-Evaluation 08/13/22    Authorization Type [employer]na Medicare    PT Start Time 1100    PT Stop Time 1150    PT Time Calculation (min) 50 min    Activity Tolerance Patient tolerated treatment well    Behavior During Therapy WFL for tasks assessed/performed             Past Medical History:  Diagnosis Date   Allergic rhinitis    Anxiety    Arthritis    Family history of colonic polyps    Fibromyalgia    Heart murmur    History of colonic polyps    Hypercholesterolemia    Hypertension    Mitral valve prolapse    Ocular migraine    Palpitations    Past Surgical History:  Procedure Laterality Date   ABDOMINAL HYSTERECTOMY     age was in her 27s   COLONOSCOPY  10/05/2017   Mild sigmoid diverticulosis. Otherwise normal colonoscopy.    cyst removal arm Left 1981   from cat scratch fever   EYE SURGERY     bilateral cataract removal   FOOT ARTHRODESIS Left 11/01/2021   Procedure: LEFT TALONAVICULAR AND SUBTALAR FUSION;  Surgeon: Newt Minion, MD;  Location: [facility];  Service: Orthopedics;  Laterality: Left;   Patient Active Problem List   Diagnosis Date Noted   Primary osteoarthritis of left shoulder 05/13/2022   Prediabetes 05/01/2022   Murmur 04/17/2022   Posterior tibial tendinitis, left leg    HLD (hyperlipidemia) 08/22/2021   Visual snow syndrome 12/30/2020   Arthropathy of lumbar facet joint 11/12/2020   Chest pain 06/14/2020   Dissociative episodes 06/14/2020   Laceration of right index finger without foreign body without damage to nail 04/03/2020   Multiple drug allergies 04/03/2020   B12 deficiency 03/13/2020   Generalized abdominal pain 03/13/2020   Low serum iron 10/03/2019   Low vitamin D level 10/03/2019   Ocular migraine 10/03/2019    Familial hypercholesterolemia 06/27/2019   Upper GI bleed 06/27/2019   Right foot pain 03/27/2019   Generalized pain 10/08/2018   Hypertension 75/91/6384   Diastolic dysfunction, left ventricle 08/18/2018   Family history of macular degeneration 08/18/2018   GERD (gastroesophageal reflux disease) 08/18/2018   History of cataract surgery 08/18/2018   History of migraine 08/18/2018   Hypertensive cardiomyopathy, without heart failure ([place]) 08/18/2018   Medication refill 08/18/2018   Mixed dyslipidemia 08/18/2018   Obesity, Class II, BMI 35-39.9 08/18/2018   Wellness examination 08/18/2018   Cardiomyopathy ([place]) 02/03/2018   Pain and swelling of ankle, right 01/27/2018   Left-sided low back pain without sciatica 12/04/2017   Fatigue 12/04/2017   Anxiety 11/04/2017   Arthralgia 11/04/2017   History of attempted suicide 11/04/2017   Insomnia 11/04/2017   Moderate episode of recurrent major depressive disorder ([place]) 11/04/2017    PCP: Jeralyn Ruths  REFERRING PROVIDER: [name]  REFERRING DIAG: left shoulder pain  THERAPY DIAG:  Acute pain of left shoulder  Stiffness of left shoulder, not elsewhere classified  Rationale for Evaluation and Treatment Rehabilitation  ONSET DATE: 04/29/22  SUBJECTIVE:  SUBJECTIVE STATEMENT: Patient reports that she got good relief from the last treatment, she reports that she was trying to dry her back after a shower, had a pop and had an increase of pain  PERTINENT HISTORY: Anxiety, arthritis, fibromyalgia, HTN  PAIN:  Are you having pain? Yes: NPRS scale: 8/10 Pain location: left lateral shoulder left upper arm, c/o some numbness in the fingers Pain description: ache, sharppain >10/10 Aggravating factors: any motions, any movements all ADL's pain  >10/10 Relieving factors: reports that nothing has helped the pain, pain at best a 9/10  PRECAUTIONS: None  WEIGHT BEARING RESTRICTIONS No  FALLS:  Has patient fallen in last 6 months? No  LIVING ENVIRONMENT: Lives with: lives with their family Lives in: House/apartment Stairs: No Has following equipment at home: None  OCCUPATION: retired  PLOF: Independent Does the cooking and cleaning, some gardening  PATIENT GOALS have less pain, do better with ADL's  OBJECTIVE:   DIAGNOSTIC FINDINGS:  Degenerative changes of the cervical spine, OA changes of the left shoulder  PATIENT SURVEYS:  FOTO 16  COGNITION:  Overall cognitive status: Within functional limits for tasks assessed     SENSATION: WFL  POSTURE: Fwd head, rounded shoulders  UPPER EXTREMITY ROM: very painful and very limited motions  Active/PROM ROM Left  Eval 05/13/22 Active Left Eval 05/13/22 Passive Left  AROM 06/05/22  Shoulder flexion 60 70 90  Shoulder extension     Shoulder abduction 30 40 90  Shoulder adduction     Shoulder internal rotation     Shoulder external rotation     Elbow flexion     Elbow extension     Wrist flexion     Wrist extension     Wrist ulnar deviation     Wrist radial deviation     Wrist pronation     Wrist supination     (Blank rows = not tested)  UPPER EXTREMITY MMT:  all motions very painful, could not do flexion and abduction due to pain  MMT Right eval Left Eval 05/13/22  Shoulder flexion    Shoulder extension    Shoulder abduction    Shoulder adduction    Shoulder internal rotation  4-/5  Shoulder external rotation  3+/5  Middle trapezius    Lower trapezius    Elbow flexion    Elbow extension    Wrist flexion    Wrist extension    Wrist ulnar deviation    Wrist radial deviation    Wrist pronation    Wrist supination    Grip strength (lbs)    (Blank rows = not tested)  SHOULDER SPECIAL TESTS:  Impingement tests: Neer impingement test:  positive    Rotator cuff assessment: Drop arm test: positive   Biceps assessment: Yergason's test: positive   PALPATION:  Has a lot of spasms in the left upper traps, sore and tender down the left upper arm   TODAY'S TREATMENT:  06/05/22 Thigh slides, shrugs Active abduction in sitting with elbow bent, as well as flexion STM with some vibration to the left shoulder, upper trap and rhomboid Vaso left shoulder low pressure 38 degrees IFC left shoulder  06/02/22 STM to the left upper trap, the left shoulder and upper arm PROM of the left shoulder Reviewl of the HEP and performed Vaso to left shoulder 40 degrees F, low pressure IFC with above in sitting   05/13/22  MHP/estim IFC for pain in sitting   PATIENT EDUCATION: Education details: HEP Person educated:  Patient Education method: Explanation, Demonstration, Verbal cues, and Handouts Education comprehension: verbalized understanding   HOME EXERCISE PROGRAM: Thigh slides, shrugs and retractions  ASSESSMENT:  CLINICAL IMPRESSION: Patient reports that she thought the last treatment really helped, she does report tha tthe pain increased after trying to dry her back this AM and had her shoulder pop.  I did look at ROM and she has made a good increaes in the past 3 weeks.  We added a few more exercises today in the session.  OBJECTIVE IMPAIRMENTS decreased activity tolerance, decreased ROM, decreased strength, increased muscle spasms, impaired flexibility, impaired sensation, impaired UE functional use, improper body mechanics, postural dysfunction, and pain.   ACTIVITY LIMITATIONS carrying, lifting, sleeping, bed mobility, bathing, toileting, dressing, self feeding, reach over head, hygiene/grooming, and caring for others  PARTICIPATION LIMITATIONS: meal prep, cleaning, laundry, driving, shopping, community activity, and yard work  [employer] POTENTIAL: Good  CLINICAL DECISION MAKING: Stable/uncomplicated  EVALUATION COMPLEXITY:  Low   GOALS: Goals reviewed with patient? Yes  SHORT TERM GOALS: Target date: 05/27/22  Independent with initial HEP Goal status: met  LONG TERM GOALS: Target date: 08/04/22  Understand posture and body mechanics Goal status: INITIAL  2.  Decreaes pain 50% Goal status:ongoing  3.  Increase shoulder AROM to 120 degrees flexion Goal status: on going  4.  Increase shoulder ER to 60 degrees Goal status: INITIAL  5.  Report no difficulty doing hair or dressing Goal status: INITIAL    PLAN: PT FREQUENCY: 1-2x/week  PT DURATION: 12 weeks  PLANNED INTERVENTIONS: Therapeutic exercises, Therapeutic activity, Neuromuscular re-education, Balance training, Gait training, Patient/Family education, Joint manipulation, Joint mobilization, Dry Needling, Electrical stimulation, Cryotherapy, Moist heat, Taping, Ultrasound, and Manual therapy  PLAN FOR NEXT SESSION: If pain is better managed will try to increase exercises and introduce gym activities  [place], PT 06/05/2022, 11:07 AM

## 2022-06-06 ENCOUNTER — Encounter: Payer: Self-pay | Admitting: Nurse Practitioner

## 2022-06-06 ENCOUNTER — Ambulatory Visit (INDEPENDENT_AMBULATORY_CARE_PROVIDER_SITE_OTHER): Payer: Medicare HMO | Admitting: Nurse Practitioner

## 2022-06-06 VITALS — BP 130/84 | HR 75 | Temp 97.8°F | Ht 61.0 in | Wt 184.0 lb

## 2022-06-06 DIAGNOSIS — M19012 Primary osteoarthritis, left shoulder: Secondary | ICD-10-CM

## 2022-06-06 DIAGNOSIS — E785 Hyperlipidemia, unspecified: Secondary | ICD-10-CM | POA: Diagnosis not present

## 2022-06-06 DIAGNOSIS — F419 Anxiety disorder, unspecified: Secondary | ICD-10-CM

## 2022-06-06 DIAGNOSIS — I10 Essential (primary) hypertension: Secondary | ICD-10-CM

## 2022-06-06 DIAGNOSIS — R69 Illness, unspecified: Secondary | ICD-10-CM | POA: Diagnosis not present

## 2022-06-06 LAB — LIPID PANEL
Cholesterol: 157 mg/dL (ref 0–200)
HDL: 67.1 mg/dL (ref >39.00)
LDL Cholesterol: 76 mg/dL (ref 0–99)
NonHDL: 89.79
Total CHOL/HDL Ratio: 2
Triglycerides: 68 mg/dL (ref 0.0–149.0)
VLDL: 13.6 mg/dL (ref 0.0–40.0)

## 2022-06-06 LAB — COMPREHENSIVE METABOLIC PANEL
ALT: 22 U/L (ref 0–35)
AST: 19 U/L (ref 0–37)
Albumin: 4.5 g/dL (ref 3.5–5.2)
Alkaline Phosphatase: 70 U/L (ref 39–117)
BUN: 18 mg/dL (ref 6–23)
CO2: 29 mEq/L (ref 19–32)
Calcium: 9.7 mg/dL (ref 8.4–10.5)
Chloride: 102 mEq/L (ref 96–112)
Creatinine, Ser: 0.67 mg/dL (ref 0.40–1.20)
GFR: 88.49 mL/min (ref >60.00)
Glucose, Bld: 93 mg/dL (ref 70–99)
Potassium: 4.3 mEq/L (ref 3.5–5.1)
Sodium: 139 mEq/L (ref 135–145)
Total Bilirubin: 0.5 mg/dL (ref 0.2–1.2)
Total Protein: 7.3 g/dL (ref 6.0–8.3)

## 2022-06-06 NOTE — Assessment & Plan Note (Signed)
Chronic, stable.  Blood pressure slightly above goal today (most likely due to pain and stress). Patient reports that she does not want to add any additional medications. Heart monitor did not reveal any significant findings and she is currently seeing Cardiology who ordered MRI of the heart next month.

## 2022-06-06 NOTE — Assessment & Plan Note (Addendum)
Patient denies intention or plans to commit suicide or harm herself.  She reports that she naps and uses journaling as an outlet for her anxiety.  She prefers not to be evaluated with psychiatry or attend counseling at this time.  She does not want to try pharmacological treatment at this time.  Patient encouraged to notify me if mood or anxiety worsens.

## 2022-06-06 NOTE — Progress Notes (Signed)
Established Patient Office Visit  Subjective   Patient ID: Leslie Griffin, female    DOB: [birth year]  Age: 70 y.o. MRN: [id number]  Chief Complaint  Patient presents with   Follow-up    HPI Hypertension- Patient's BP is elevated today and she says it is related to pain and stress. Patient admits to CP that comes and goes and is scheduled next month for an MRI that was ordered by her cardiologist. Patient denies chest tightness, dizziness, sweating with CP or SOB. Hyperlipidemia - She is still taking Crestor 10 mg with no side effects of leg cramps. Here today to get cholesterol rechecked.  Left Shoulder - Patient continues to have shoulder pain. She states that she is currently in PT and that is helping, but she had an MRI that showed 2 tendon ruptures that will require a reverse shoulder replacement. Followed by orthopedic surgery. Anxiety -  Patient stated that she was having increased anxiety, depression and thought of suicide with current life stressors with family and medical problems. She does not take anything and doesn't want to take any additional medication. She does journal and takes naps when she is feeling this way.     Review of Systems  Constitutional:  Negative for chills and fever.  HENT:  Positive for tinnitus. Negative for congestion and sore throat.   Eyes:  Positive for blurred vision (visual snow). Negative for pain.  Respiratory:  Negative for cough and shortness of breath.   Cardiovascular:  Positive for chest pain. Negative for palpitations.  Gastrointestinal:  Negative for constipation, diarrhea, nausea and vomiting.  Genitourinary:  Negative for dysuria, frequency and urgency.  Musculoskeletal:  Positive for back pain and joint pain (left shoulder, shoulder replacement needed).  Skin:  Negative for rash.  Neurological:  Positive for dizziness and headaches.  Psychiatric/Behavioral:  Positive for depression and suicidal ideas. The patient is nervous/anxious.        Objective:     BP 130/84 (BP Location: Left Arm, Patient Position: Sitting, Cuff Size: Large)   Pulse 75   Temp 97.8 F (36.6 C) (Temporal)   Ht '5\' 1"'$  (1.549 m)   Wt 184 lb (83.5 kg)   SpO2 98%   BMI 34.77 kg/m  BP Readings from Last 3 Encounters:  06/06/22 130/84  05/30/22 138/78  05/21/22 116/68      Physical Exam HENT:     Head: Normocephalic.  Cardiovascular:     Rate and Rhythm: Normal rate and regular rhythm.     Heart sounds: Murmur heard.  Pulmonary:     Effort: Pulmonary effort is normal.     Breath sounds: Normal breath sounds.  Musculoskeletal:        General: Signs of injury (left ankle) present.  Skin:    General: Skin is warm.     Findings: Bruising (left ankle and LUE) present.  Neurological:     Mental Status: She is alert and oriented to person, place, and time.  Psychiatric:        Behavior: Behavior normal.      No results found for any visits on 06/06/22.  Last metabolic panel Lab Results  Component Value Date   GLUCOSE 98 04/11/2022   NA 136 04/11/2022   K 3.5 04/11/2022   CL 102 04/11/2022   CO2 21 (L) 04/11/2022   BUN 14 04/11/2022   CREATININE 0.85 04/11/2022   GFRNONAA >60 04/11/2022   CALCIUM 9.5 04/11/2022   PROT 7.2 08/22/2021   ALBUMIN 4.4  08/22/2021   BILITOT 0.5 08/22/2021   ALKPHOS 70 08/22/2021   AST 16 08/22/2021   ALT 22 08/22/2021   ANIONGAP 13 04/11/2022   Last lipids Lab Results  Component Value Date   CHOL 256 (H) 08/22/2021   HDL 65.00 08/22/2021   LDLCALC 166 (H) 08/22/2021   TRIG 124.0 08/22/2021   CHOLHDL 4 08/22/2021      The 10-year ASCVD risk score (Arnett DK, et al., 2019) is: 13.5%    Assessment & Plan:   Problem List Items Addressed This Visit       Cardiovascular and Mediastinum   Hypertension    Chronic, stable.  Blood pressure slightly above goal today (most likely due to pain and stress). Patient reports that she does not want to add any additional medications. Heart  monitor did not reveal any significant findings and she is currently seeing Cardiology who ordered MRI of the heart next month.          Musculoskeletal and Integument   Primary osteoarthritis of left shoulder    Patient to continue with PT and will consider reverse shoulder replacement with Ortho.        Other   HLD (hyperlipidemia) - Primary    Chronic, patient to continue rosuvastatin 10 mg/day. Labs ordered today FU in 3 months      Relevant Orders   Lipid Profile   Comprehensive metabolic panel   Anxiety    Patient denies intention or plans to commit suicide or harm herself.  She reports that she naps and uses journaling as an outlet for her anxiety.  She prefers not to be evaluated with psychiatry or attend counseling at this time.  She does not want to try pharmacological treatment at this time.  Patient encouraged to notify me if mood or anxiety worsens.       Return in about 3 months (around 09/06/2022) for F/u with Judson Roch.    Ailene Ards, NP

## 2022-06-06 NOTE — Assessment & Plan Note (Signed)
Chronic, patient to continue rosuvastatin 10 mg/day. Labs ordered today FU in 3 months

## 2022-06-06 NOTE — Assessment & Plan Note (Signed)
Patient to continue with PT and will consider reverse shoulder replacement with Ortho.

## 2022-06-09 ENCOUNTER — Ambulatory Visit: Payer: Medicare HMO | Admitting: Physical Therapy

## 2022-06-09 NOTE — Telephone Encounter (Signed)
Norco and oxycodone work about the same..  If you need to be on those long-term to avoid surgery we would likely send you to pain management.  We can do that shoulder surgery anytime as an elective case but if you need to wait to get more mobile after the ankle issues that is understandable as well.

## 2022-06-10 ENCOUNTER — Ambulatory Visit: Payer: Medicare HMO

## 2022-06-10 NOTE — Progress Notes (Deleted)
Patient ID: Leslie Griffin                 DOB: [birth year]                      MRN: [id number]     HPI: Shayda Kalka is a 71 y.o. female referred by Dr. Marland Kitchen to HTN clinic. PMH is significant for  Current HTN meds:  Previously tried:  BP goal:   Family History:   Social History:   Diet:   Exercise:   Home BP readings:   Wt Readings from Last 3 Encounters:  06/06/22 184 lb (83.5 kg)  05/30/22 188 lb (85.3 kg)  05/13/22 188 lb (85.3 kg)   BP Readings from Last 3 Encounters:  06/06/22 130/84  05/30/22 138/78  05/21/22 116/68   Pulse Readings from Last 3 Encounters:  06/06/22 75  05/30/22 (!) 105  05/21/22 (!) 56    Renal function: Estimated Creatinine Clearance: 64.1 mL/min (by C-G formula based on SCr of 0.67 mg/dL).  Past Medical History:  Diagnosis Date   Allergic rhinitis    Anxiety    Arthritis    Family history of colonic polyps    Fibromyalgia    Heart murmur    History of colonic polyps    Hypercholesterolemia    Hypertension    Mitral valve prolapse    Ocular migraine    Palpitations     Current Outpatient Medications on File Prior to Visit  Medication Sig Dispense Refill   cholecalciferol (VITAMIN D) 25 MCG (1000 UNIT) tablet Take 1,000 Units by mouth daily.     cyanocobalamin 1000 MCG tablet Take 1 tablet by mouth daily.     ferrous sulfate 325 (65 FE) MG tablet Take 1 tablet by mouth daily.     Omega-3 Fatty Acids (FISH OIL) 1200 MG CAPS Take 2,400 mg by mouth daily.     omeprazole (PRILOSEC OTC) 20 MG tablet Take 1 tablet (20 mg total) by mouth daily. 30 tablet 3   pregabalin (LYRICA) 75 MG capsule Take 1 capsule (75 mg total) by mouth 2 (two) times daily as needed. 60 capsule 3   rosuvastatin (CRESTOR) 10 MG tablet Take 1 tablet (10 mg total) by mouth daily. 90 tablet 3   spironolactone (ALDACTONE) 25 MG tablet Take 1 tablet (25 mg total) by mouth daily. 90 tablet 2   No current facility-administered medications on file prior to visit.     Allergies  Allergen Reactions   Benadryl [Diphenhydramine Hcl] Shortness Of Breath and Rash   Cephalexin Shortness Of Breath and Rash    *Keflex   Duloxetine Other (See Comments)    Violent tremors    Penicillins Shortness Of Breath and Rash    Childhood Reaction    Topiramate Shortness Of Breath   Sulfa Antibiotics Rash     Assessment/Plan:  1. Hypertension -

## 2022-06-11 ENCOUNTER — Ambulatory Visit: Payer: Medicare HMO | Admitting: Physical Therapy

## 2022-06-12 ENCOUNTER — Ambulatory Visit: Payer: Medicare HMO | Admitting: Nurse Practitioner

## 2022-06-13 ENCOUNTER — Ambulatory Visit: Payer: Medicare HMO | Admitting: Physical Therapy

## 2022-06-16 ENCOUNTER — Ambulatory Visit: Payer: Medicare HMO | Admitting: Physical Therapy

## 2022-06-16 ENCOUNTER — Encounter: Payer: Self-pay | Admitting: Orthopedic Surgery

## 2022-06-16 ENCOUNTER — Encounter: Payer: Self-pay | Admitting: Physical Therapy

## 2022-06-16 DIAGNOSIS — M25512 Pain in left shoulder: Secondary | ICD-10-CM | POA: Diagnosis not present

## 2022-06-16 DIAGNOSIS — M25612 Stiffness of left shoulder, not elsewhere classified: Secondary | ICD-10-CM

## 2022-06-16 NOTE — Therapy (Signed)
OUTPATIENT PHYSICAL THERAPY SHOULDER EVALUATION   Patient Name: Leslie Griffin MRN: [id number] DOB:[birth year], 71 y.o., female Today's Date: 06/16/2022   PT End of Session - 06/16/22 0943     Visit Number 4    Date for PT Re-Evaluation 08/13/22    PT Start Time 0943    PT Stop Time 1020    PT Time Calculation (min) 37 min    Activity Tolerance Patient limited by pain    Behavior During Therapy [facility] for tasks assessed/performed             Past Medical History:  Diagnosis Date   Allergic rhinitis    Anxiety    Arthritis    Family history of colonic polyps    Fibromyalgia    Heart murmur    History of colonic polyps    Hypercholesterolemia    Hypertension    Mitral valve prolapse    Ocular migraine    Palpitations    Past Surgical History:  Procedure Laterality Date   ABDOMINAL HYSTERECTOMY     age was in her 58s   COLONOSCOPY  10/05/2017   Mild sigmoid diverticulosis. Otherwise normal colonoscopy.    cyst removal arm Left 1981   from cat scratch fever   EYE SURGERY     bilateral cataract removal   FOOT ARTHRODESIS Left 11/01/2021   Procedure: LEFT TALONAVICULAR AND SUBTALAR FUSION;  Surgeon: Newt Minion, MD;  Location: [place];  Service: Orthopedics;  Laterality: Left;   Patient Active Problem List   Diagnosis Date Noted   Primary osteoarthritis of left shoulder 05/13/2022   Prediabetes 05/01/2022   Murmur 04/17/2022   Posterior tibial tendinitis, left leg    HLD (hyperlipidemia) 08/22/2021   Visual snow syndrome 12/30/2020   Arthropathy of lumbar facet joint 11/12/2020   Chest pain 06/14/2020   Dissociative episodes 06/14/2020   Laceration of right index finger without foreign body without damage to nail 04/03/2020   Multiple drug allergies 04/03/2020   B12 deficiency 03/13/2020   Generalized abdominal pain 03/13/2020   Low serum iron 10/03/2019   Low vitamin D level 10/03/2019   Ocular migraine 10/03/2019   Familial hypercholesterolemia 06/27/2019    Upper GI bleed 06/27/2019   Right foot pain 03/27/2019   Generalized pain 10/08/2018   Hypertension 92/11/69   Diastolic dysfunction, left ventricle 08/18/2018   Family history of macular degeneration 08/18/2018   GERD (gastroesophageal reflux disease) 08/18/2018   History of cataract surgery 08/18/2018   History of migraine 08/18/2018   Hypertensive cardiomyopathy, without heart failure ([place]) 08/18/2018   Medication refill 08/18/2018   Mixed dyslipidemia 08/18/2018   Obesity, Class II, BMI 35-39.9 08/18/2018   Wellness examination 08/18/2018   Cardiomyopathy ([place]) 02/03/2018   Pain and swelling of ankle, right 01/27/2018   Left-sided low back pain without sciatica 12/04/2017   Fatigue 12/04/2017   Anxiety 11/04/2017   Arthralgia 11/04/2017   History of attempted suicide 11/04/2017   Insomnia 11/04/2017   Moderate episode of recurrent major depressive disorder ([place]) 11/04/2017    PCP: Jeralyn Ruths  REFERRING PROVIDER: [name]  REFERRING DIAG: left shoulder pain  THERAPY DIAG:  Acute pain of left shoulder  Stiffness of left shoulder, not elsewhere classified  Rationale for Evaluation and Treatment Rehabilitation  ONSET DATE: 04/29/22  SUBJECTIVE:  SUBJECTIVE STATEMENT: Shoulder is still bothering her, ROM has improved some.  PERTINENT HISTORY: Anxiety, arthritis, fibromyalgia, HTN  PAIN:  Are you having pain? Yes: NPRS scale: 5/10 Pain location: left lateral shoulder left upper arm, c/o some numbness in the fingers Pain description: ache, sharppain >10/10 Aggravating factors: any motions, any movements all ADL's pain >10/10 Relieving factors: reports that nothing has helped the pain, pain at best a 9/10  PRECAUTIONS: None  WEIGHT BEARING RESTRICTIONS No  FALLS:  Has patient  fallen in last 6 months? No  LIVING ENVIRONMENT: Lives with: lives with their family Lives in: House/apartment Stairs: No Has following equipment at home: None  OCCUPATION: retired  PLOF: Independent Does the cooking and cleaning, some gardening  PATIENT GOALS have less pain, do better with ADL's  OBJECTIVE:   DIAGNOSTIC FINDINGS:  Degenerative changes of the cervical spine, OA changes of the left shoulder  PATIENT SURVEYS:  FOTO 16  COGNITION:  Overall cognitive status: Within functional limits for tasks assessed     SENSATION: WFL  POSTURE: Fwd head, rounded shoulders  UPPER EXTREMITY ROM: very painful and very limited motions  Active/PROM ROM Left  Eval 05/13/22 Active Left Eval 05/13/22 Passive Left  AROM 06/05/22  Shoulder flexion 60 70 90  Shoulder extension     Shoulder abduction 30 40 90  Shoulder adduction     Shoulder internal rotation     Shoulder external rotation     Elbow flexion     Elbow extension     Wrist flexion     Wrist extension     Wrist ulnar deviation     Wrist radial deviation     Wrist pronation     Wrist supination     (Blank rows = not tested)  UPPER EXTREMITY MMT:  all motions very painful, could not do flexion and abduction due to pain  MMT Right eval Left Eval 05/13/22  Shoulder flexion    Shoulder extension    Shoulder abduction    Shoulder adduction    Shoulder internal rotation  4-/5  Shoulder external rotation  3+/5  Middle trapezius    Lower trapezius    Elbow flexion    Elbow extension    Wrist flexion    Wrist extension    Wrist ulnar deviation    Wrist radial deviation    Wrist pronation    Wrist supination    Grip strength (lbs)    (Blank rows = not tested)  SHOULDER SPECIAL TESTS:  Impingement tests: Neer impingement test: positive    Rotator cuff assessment: Drop arm test: positive   Biceps assessment: Yergason's test: positive   PALPATION:  Has a lot of spasms in the left upper traps,  sore and tender down the left upper arm   TODAY'S TREATMENT:  06/16/22 AAROM cane Flex, Ext, IR x5 each LUE pain with flex  LUE AAROM abd cane x8 Rows yellow 2x10  STM upper trap and rhomboid Vaso left shoulder low pressure 38 degrees IFC left shoulder  06/05/22 Thigh slides, shrugs Active abduction in sitting with elbow bent, as well as flexion STM with some vibration to the left shoulder, upper trap and rhomboid Vaso left shoulder low pressure 38 degrees IFC left shoulder  06/02/22 STM to the left upper trap, the left shoulder and upper arm PROM of the left shoulder Reviewl of the HEP and performed Vaso to left shoulder 40 degrees F, low pressure IFC with above in sitting   05/13/22  MHP/estim  IFC for pain in sitting   PATIENT EDUCATION: Education details: HEP Person educated: Patient Education method: Consulting civil engineer, Media planner, Verbal cues, and Handouts Education comprehension: verbalized understanding   HOME EXERCISE PROGRAM: Thigh slides, shrugs and retractions  ASSESSMENT:  CLINICAL IMPRESSION: Pt enters with reports of L should pain. She did tolerated amore active treatment session. Pain reported with shoulder flexion. Discomfort reported with IR up back. Cues needed to relax with MT. Some density noted with upper L trap. Positive response to VASO.  OBJECTIVE IMPAIRMENTS decreased activity tolerance, decreased ROM, decreased strength, increased muscle spasms, impaired flexibility, impaired sensation, impaired UE functional use, improper body mechanics, postural dysfunction, and pain.   ACTIVITY LIMITATIONS carrying, lifting, sleeping, bed mobility, bathing, toileting, dressing, self feeding, reach over head, hygiene/grooming, and caring for others  PARTICIPATION LIMITATIONS: meal prep, cleaning, laundry, driving, shopping, community activity, and yard work  [employer] POTENTIAL: Good  CLINICAL DECISION MAKING: Stable/uncomplicated  EVALUATION COMPLEXITY:  Low   GOALS: Goals reviewed with patient? Yes  SHORT TERM GOALS: Target date: 05/27/22  Independent with initial HEP Goal status: met  LONG TERM GOALS: Target date: 08/04/22  Understand posture and body mechanics Goal status: INITIAL  2.  Decreaes pain 50% Goal status:ongoing  3.  Increase shoulder AROM to 120 degrees flexion Goal status: on going  4.  Increase shoulder ER to 60 degrees Goal status: INITIAL  5.  Report no difficulty doing hair or dressing Goal status: INITIAL    PLAN: PT FREQUENCY: 1-2x/week  PT DURATION: 12 weeks  PLANNED INTERVENTIONS: Therapeutic exercises, Therapeutic activity, Neuromuscular re-education, Balance training, Gait training, Patient/Family education, Joint manipulation, Joint mobilization, Dry Needling, Electrical stimulation, Cryotherapy, Moist heat, Taping, Ultrasound, and Manual therapy  PLAN FOR NEXT SESSION: If pain is better managed will try to increase exercises and introduce gym activities  Scot Jun, PTA 06/16/2022, 10:17 AM

## 2022-06-16 NOTE — Progress Notes (Signed)
Office Visit Note   Patient: Leslie Griffin           Date of Birth: 02-06-1951           MRN: [id number] Visit Date: 06/02/2022              Requested by: Ailene Ards, NP [address] [place],  [place] [address] PCP: Ailene Ards, NP  Chief Complaint  Patient presents with   Left Foot - Pain      HPI: Patient is a 71 year old woman who is seen for initial evaluation for left forefoot injury.  Patient states she fell and sustained an injury to the left forefoot.  Patient states that her left foot is improving but she still has swelling and bruising.  Assessment & Plan: Visit Diagnoses:  1. Pain in left foot     Plan: Patient will continue with the fracture boot and wean out of this as she feels comfortable.  No intervention necessary for the nondisplaced talar neck fractures 2 3 and 4.  Follow-Up Instructions: Return in about 4 weeks (around 06/30/2022).   Ortho Exam  Patient is alert, oriented, no adenopathy, well-dressed, normal affect, normal respiratory effort. Examination of the left foot patient has bruising across the forefoot.  Radiographs were reviewed which shows nondisplaced fractures across the metatarsal necks 2 3 and 4.  Patient also has a stable talonavicular and subtalar fusion with intact hardware.  Imaging: No results found. No images are attached to the encounter.  Labs: Lab Results  Component Value Date   HGBA1C 5.8 08/22/2021     Lab Results  Component Value Date   ALBUMIN 4.5 06/06/2022   ALBUMIN 4.4 08/22/2021   ALBUMIN 4.4 10/26/2019    No results found for: "MG" No results found for: "VD25OH"  No results found for: "PREALBUMIN"    Latest Ref Rng & Units 04/11/2022   11:36 AM 10/29/2021    9:41 AM 08/22/2021    9:21 AM  CBC EXTENDED  WBC 4.0 - 10.5 K/uL 8.5  5.5  4.6   RBC 3.87 - 5.11 MIL/uL 4.96  4.45  4.50   Hemoglobin 12.0 - 15.0 g/dL 14.9  13.4  13.7   HCT 36.0 - 46.0 % 45.7  41.7  40.9   Platelets 150 - 400 K/uL 243  210   190.0   NEUT# 1.4 - 7.7 K/uL   2.1   Lymph# 0.7 - 4.0 K/uL   1.8      There is no height or weight on file to calculate BMI.  Orders:  No orders of the defined types were placed in this encounter.  No orders of the defined types were placed in this encounter.    Procedures: No procedures performed  Clinical Data: No additional findings.  ROS:  All other systems negative, except as noted in the HPI. Review of Systems  Objective: Vital Signs: There were no vitals taken for this visit.  Specialty Comments:  No specialty comments available.  PMFS History: Patient Active Problem List   Diagnosis Date Noted   Primary osteoarthritis of left shoulder 05/13/2022   Prediabetes 05/01/2022   Murmur 04/17/2022   Posterior tibial tendinitis, left leg    HLD (hyperlipidemia) 08/22/2021   Visual snow syndrome 12/30/2020   Arthropathy of lumbar facet joint 11/12/2020   Chest pain 06/14/2020   Dissociative episodes 06/14/2020   Laceration of right index finger without foreign body without damage to nail 04/03/2020   Multiple drug allergies  04/03/2020   B12 deficiency 03/13/2020   Generalized abdominal pain 03/13/2020   Low serum iron 10/03/2019   Low vitamin D level 10/03/2019   Ocular migraine 10/03/2019   Familial hypercholesterolemia 06/27/2019   Upper GI bleed 06/27/2019   Right foot pain 03/27/2019   Generalized pain 10/08/2018   Hypertension 62/83/6629   Diastolic dysfunction, left ventricle 08/18/2018   Family history of macular degeneration 08/18/2018   GERD (gastroesophageal reflux disease) 08/18/2018   History of cataract surgery 08/18/2018   History of migraine 08/18/2018   Hypertensive cardiomyopathy, without heart failure ([place]) 08/18/2018   Medication refill 08/18/2018   Mixed dyslipidemia 08/18/2018   Obesity, Class II, BMI 35-39.9 08/18/2018   Wellness examination 08/18/2018   Cardiomyopathy ([place]) 02/03/2018   Pain and swelling of ankle, right  01/27/2018   Left-sided low back pain without sciatica 12/04/2017   Fatigue 12/04/2017   Anxiety 11/04/2017   Arthralgia 11/04/2017   History of attempted suicide 11/04/2017   Insomnia 11/04/2017   Moderate episode of recurrent major depressive disorder ([place]) 11/04/2017   Past Medical History:  Diagnosis Date   Allergic rhinitis    Anxiety    Arthritis    Family history of colonic polyps    Fibromyalgia    Heart murmur    History of colonic polyps    Hypercholesterolemia    Hypertension    Mitral valve prolapse    Ocular migraine    Palpitations     Family History  Problem Relation Age of Onset   Coronary artery disease Other    Heart disease Mother    Heart disease Father    Colon polyps Sister    Other Sister        low hemoglobin   Colon cancer Neg Hx    Esophageal cancer Neg Hx    Rectal cancer Neg Hx    Stomach cancer Neg Hx     Past Surgical History:  Procedure Laterality Date   ABDOMINAL HYSTERECTOMY     age was in her 31s   COLONOSCOPY  10/05/2017   Mild sigmoid diverticulosis. Otherwise normal colonoscopy.    cyst removal arm Left 1981   from cat scratch fever   EYE SURGERY     bilateral cataract removal   FOOT ARTHRODESIS Left 11/01/2021   Procedure: LEFT TALONAVICULAR AND SUBTALAR FUSION;  Surgeon: Newt Minion, MD;  Location: [place];  Service: Orthopedics;  Laterality: Left;   Social History   Occupational History   Not on file  Tobacco Use   Smoking status: Never   Smokeless tobacco: Never  Vaping Use   Vaping Use: Never used  Substance and Sexual Activity   Alcohol use: Yes    Comment: ocassionally    Drug use: No   Sexual activity: Not on file

## 2022-06-20 ENCOUNTER — Other Ambulatory Visit: Payer: Self-pay | Admitting: Nurse Practitioner

## 2022-06-20 ENCOUNTER — Ambulatory Visit: Payer: Medicare HMO | Attending: Family Medicine | Admitting: Physical Therapy

## 2022-06-20 ENCOUNTER — Encounter: Payer: Self-pay | Admitting: Physical Therapy

## 2022-06-20 DIAGNOSIS — M25512 Pain in left shoulder: Secondary | ICD-10-CM | POA: Insufficient documentation

## 2022-06-20 DIAGNOSIS — I10 Essential (primary) hypertension: Secondary | ICD-10-CM

## 2022-06-20 DIAGNOSIS — M25612 Stiffness of left shoulder, not elsewhere classified: Secondary | ICD-10-CM | POA: Diagnosis not present

## 2022-06-20 NOTE — Therapy (Signed)
OUTPATIENT PHYSICAL THERAPY SHOULDER EVALUATION   Patient Name: Leslie Griffin MRN: [id number] DOB:11-06-1951, 71 y.o., female Today's Date: 06/20/2022   PT End of Session - 06/20/22 0928     Visit Number 5    Date for PT Re-Evaluation 08/13/22    PT Start Time 0930    PT Stop Time 1015    PT Time Calculation (min) 45 min    Activity Tolerance Patient limited by pain    Behavior During Therapy [facility] for tasks assessed/performed             Past Medical History:  Diagnosis Date   Allergic rhinitis    Anxiety    Arthritis    Family history of colonic polyps    Fibromyalgia    Heart murmur    History of colonic polyps    Hypercholesterolemia    Hypertension    Mitral valve prolapse    Ocular migraine    Palpitations    Past Surgical History:  Procedure Laterality Date   ABDOMINAL HYSTERECTOMY     age was in her 70s   COLONOSCOPY  10/05/2017   Mild sigmoid diverticulosis. Otherwise normal colonoscopy.    cyst removal arm Left 1981   from cat scratch fever   EYE SURGERY     bilateral cataract removal   FOOT ARTHRODESIS Left 11/01/2021   Procedure: LEFT TALONAVICULAR AND SUBTALAR FUSION;  Surgeon: Newt Minion, MD;  Location: [facility];  Service: Orthopedics;  Laterality: Left;   Patient Active Problem List   Diagnosis Date Noted   Primary osteoarthritis of left shoulder 05/13/2022   Prediabetes 05/01/2022   Murmur 04/17/2022   Posterior tibial tendinitis, left leg    HLD (hyperlipidemia) 08/22/2021   Visual snow syndrome 12/30/2020   Arthropathy of lumbar facet joint 11/12/2020   Chest pain 06/14/2020   Dissociative episodes 06/14/2020   Laceration of right index finger without foreign body without damage to nail 04/03/2020   Multiple drug allergies 04/03/2020   B12 deficiency 03/13/2020   Generalized abdominal pain 03/13/2020   Low serum iron 10/03/2019   Low vitamin D level 10/03/2019   Ocular migraine 10/03/2019   Familial hypercholesterolemia 06/27/2019    Upper GI bleed 06/27/2019   Right foot pain 03/27/2019   Generalized pain 10/08/2018   Hypertension 20/25/4270   Diastolic dysfunction, left ventricle 08/18/2018   Family history of macular degeneration 08/18/2018   GERD (gastroesophageal reflux disease) 08/18/2018   History of cataract surgery 08/18/2018   History of migraine 08/18/2018   Hypertensive cardiomyopathy, without heart failure ([place]) 08/18/2018   Medication refill 08/18/2018   Mixed dyslipidemia 08/18/2018   Obesity, Class II, BMI 35-39.9 08/18/2018   Wellness examination 08/18/2018   Cardiomyopathy ([place]) 02/03/2018   Pain and swelling of ankle, right 01/27/2018   Left-sided low back pain without sciatica 12/04/2017   Fatigue 12/04/2017   Anxiety 11/04/2017   Arthralgia 11/04/2017   History of attempted suicide 11/04/2017   Insomnia 11/04/2017   Moderate episode of recurrent major depressive disorder ([place]) 11/04/2017    PCP: Jeralyn Ruths  REFERRING PROVIDER: [name]  REFERRING DIAG: left shoulder pain  THERAPY DIAG:  Acute pain of left shoulder  Stiffness of left shoulder, not elsewhere classified  Rationale for Evaluation and Treatment Rehabilitation  ONSET DATE: 04/29/22  SUBJECTIVE:  SUBJECTIVE STATEMENT: Some what better, Some improved ROM  PERTINENT HISTORY: Anxiety, arthritis, fibromyalgia, HTN  PAIN:  Are you having pain? Yes: NPRS scale: 6/10 Pain location: left lateral shoulder left upper arm, c/o some numbness in the fingers Pain description: ache, sharppain >10/10 Aggravating factors: any motions, any movements all ADL's pain >10/10 Relieving factors: reports that nothing has helped the pain, pain at best a 9/10  PRECAUTIONS: None  WEIGHT BEARING RESTRICTIONS No  FALLS:  Has patient fallen in last 6 months?  No  LIVING ENVIRONMENT: Lives with: lives with their family Lives in: House/apartment Stairs: No Has following equipment at home: None  OCCUPATION: retired  PLOF: Independent Does the cooking and cleaning, some gardening  PATIENT GOALS have less pain, do better with ADL's  OBJECTIVE:   DIAGNOSTIC FINDINGS:  Degenerative changes of the cervical spine, OA changes of the left shoulder  PATIENT SURVEYS:  FOTO 16  POSTURE: Fwd head, rounded shoulders  UPPER EXTREMITY ROM: very painful and very limited motions  Active/PROM ROM Left  Eval 05/13/22 Active Left Eval 05/13/22 Passive Left  AROM 06/05/22 L shoulder AROM  06/19/22  Shoulder flexion 60 70 90 140  Shoulder extension      Shoulder abduction 30 40 90 130  Shoulder adduction      Shoulder internal rotation      Shoulder external rotation      Elbow flexion      Elbow extension      Wrist flexion      Wrist extension      Wrist ulnar deviation      Wrist radial deviation      Wrist pronation      Wrist supination      (Blank rows = not tested)  UPPER EXTREMITY MMT:  all motions very painful, could not do flexion and abduction due to pain  MMT Right eval Left Eval 05/13/22  Shoulder flexion    Shoulder extension    Shoulder abduction    Shoulder adduction    Shoulder internal rotation  4-/5  Shoulder external rotation  3+/5  Middle trapezius    Lower trapezius    Elbow flexion    Elbow extension    Wrist flexion    Wrist extension    Wrist ulnar deviation    Wrist radial deviation    Wrist pronation    Wrist supination    Grip strength (lbs)    (Blank rows = not tested)  SHOULDER SPECIAL TESTS:  Impingement tests: Neer impingement test: positive    Rotator cuff assessment: Drop arm test: positive   Biceps assessment: Yergason's test: positive   PALPATION:  Has a lot of spasms in the left upper traps, sore and tender down the left upper arm   TODAY'S TREATMENT:  06/19/22 NuStep L3 x6 min   AAROM cane 1lb WaTE Flex, Ext, IR 2x5  Row red 2x10 [place] Ext Red 2x10 LUE ER then Flex AROM  x5 Vasu L shoulder low pressure 34deg x10 min    06/16/22 AAROM cane Flex, Ext, IR x5 each LUE pain with flex  LUE AAROM abd cane x8 Rows yellow 2x10  STM upper trap and rhomboid Vaso left shoulder low pressure 38 degrees IFC left shoulder  06/05/22 Thigh slides, shrugs Active abduction in sitting with elbow bent, as well as flexion STM with some vibration to the left shoulder, upper trap and rhomboid Vaso left shoulder low pressure 38 degrees IFC left shoulder  06/02/22 STM to the left  upper trap, the left shoulder and upper arm PROM of the left shoulder Reviewl of the HEP and performed Vaso to left shoulder 40 degrees F, low pressure IFC with above in sitting   05/13/22  MHP/estim IFC for pain in sitting   PATIENT EDUCATION: Education details: HEP Person educated: Patient Education method: Consulting civil engineer, Media planner, Verbal cues, and Handouts Education comprehension: verbalized understanding   HOME EXERCISE PROGRAM: Thigh slides, shrugs and retractions  ASSESSMENT:  CLINICAL IMPRESSION: Pt enters with reports of improvement. She has progressed increasing her L shoulder AROM. Continues with an active treatment. She did well overall, L shoulder has a decrease activity tolerance. As session progressed pt started having pain with the last few interventions.   OBJECTIVE IMPAIRMENTS decreased activity tolerance, decreased ROM, decreased strength, increased muscle spasms, impaired flexibility, impaired sensation, impaired UE functional use, improper body mechanics, postural dysfunction, and pain.   ACTIVITY LIMITATIONS carrying, lifting, sleeping, bed mobility, bathing, toileting, dressing, self feeding, reach over head, hygiene/grooming, and caring for others  PARTICIPATION LIMITATIONS: meal prep, cleaning, laundry, driving, shopping, community activity, and yard  work  [employer] POTENTIAL: Good  CLINICAL DECISION MAKING: Stable/uncomplicated  EVALUATION COMPLEXITY: Low   GOALS: Goals reviewed with patient? Yes  SHORT TERM GOALS: Target date: 05/27/22  Independent with initial HEP Goal status: met  LONG TERM GOALS: Target date: 08/04/22  Understand posture and body mechanics Goal status: INITIAL  2.  Decreaes pain 50% Goal status:ongoing  3.  Increase shoulder AROM to 120 degrees flexion Goal status: Met  4.  Increase shoulder ER to 60 degrees Goal status: INITIAL  5.  Report no difficulty doing hair or dressing Goal status: INITIAL    PLAN: PT FREQUENCY: 1-2x/week  PT DURATION: 12 weeks  PLANNED INTERVENTIONS: Therapeutic exercises, Therapeutic activity, Neuromuscular re-education, Balance training, Gait training, Patient/Family education, Joint manipulation, Joint mobilization, Dry Needling, Electrical stimulation, Cryotherapy, Moist heat, Taping, Ultrasound, and Manual therapy  PLAN FOR NEXT SESSION: If pain is better managed will try to increase exercises and introduce gym activities  Scot Jun, PTA 06/20/2022, 9:28 AM

## 2022-06-23 ENCOUNTER — Encounter: Payer: Self-pay | Admitting: Physical Therapy

## 2022-06-23 ENCOUNTER — Ambulatory Visit: Payer: Medicare HMO | Admitting: Physical Therapy

## 2022-06-23 DIAGNOSIS — M25612 Stiffness of left shoulder, not elsewhere classified: Secondary | ICD-10-CM | POA: Diagnosis not present

## 2022-06-23 DIAGNOSIS — M25512 Pain in left shoulder: Secondary | ICD-10-CM | POA: Diagnosis not present

## 2022-06-23 NOTE — Therapy (Signed)
OUTPATIENT PHYSICAL THERAPY SHOULDER EVALUATION   Patient Name: Leslie Griffin MRN: [id number] DOB:02/01/1951, 71 y.o., female Today's Date: 06/23/2022   PT End of Session - 06/23/22 1301     Visit Number 6    Date for PT Re-Evaluation 08/13/22    PT Start Time 1300    PT Stop Time 2446    PT Time Calculation (min) 45 min    Activity Tolerance Patient limited by pain;Patient tolerated treatment well    Behavior During Therapy [facility] for tasks assessed/performed             Past Medical History:  Diagnosis Date   Allergic rhinitis    Anxiety    Arthritis    Family history of colonic polyps    Fibromyalgia    Heart murmur    History of colonic polyps    Hypercholesterolemia    Hypertension    Mitral valve prolapse    Ocular migraine    Palpitations    Past Surgical History:  Procedure Laterality Date   ABDOMINAL HYSTERECTOMY     age was in her 31s   COLONOSCOPY  10/05/2017   Mild sigmoid diverticulosis. Otherwise normal colonoscopy.    cyst removal arm Left 1981   from cat scratch fever   EYE SURGERY     bilateral cataract removal   FOOT ARTHRODESIS Left 11/01/2021   Procedure: LEFT TALONAVICULAR AND SUBTALAR FUSION;  Surgeon: Newt Minion, MD;  Location: [place];  Service: Orthopedics;  Laterality: Left;   Patient Active Problem List   Diagnosis Date Noted   Primary osteoarthritis of left shoulder 05/13/2022   Prediabetes 05/01/2022   Murmur 04/17/2022   Posterior tibial tendinitis, left leg    HLD (hyperlipidemia) 08/22/2021   Visual snow syndrome 12/30/2020   Arthropathy of lumbar facet joint 11/12/2020   Chest pain 06/14/2020   Dissociative episodes 06/14/2020   Laceration of right index finger without foreign body without damage to nail 04/03/2020   Multiple drug allergies 04/03/2020   B12 deficiency 03/13/2020   Generalized abdominal pain 03/13/2020   Low serum iron 10/03/2019   Low vitamin D level 10/03/2019   Ocular migraine 10/03/2019   Familial  hypercholesterolemia 06/27/2019   Upper GI bleed 06/27/2019   Right foot pain 03/27/2019   Generalized pain 10/08/2018   Hypertension 28/63/8177   Diastolic dysfunction, left ventricle 08/18/2018   Family history of macular degeneration 08/18/2018   GERD (gastroesophageal reflux disease) 08/18/2018   History of cataract surgery 08/18/2018   History of migraine 08/18/2018   Hypertensive cardiomyopathy, without heart failure ([place]) 08/18/2018   Medication refill 08/18/2018   Mixed dyslipidemia 08/18/2018   Obesity, Class II, BMI 35-39.9 08/18/2018   Wellness examination 08/18/2018   Cardiomyopathy ([place]) 02/03/2018   Pain and swelling of ankle, right 01/27/2018   Left-sided low back pain without sciatica 12/04/2017   Fatigue 12/04/2017   Anxiety 11/04/2017   Arthralgia 11/04/2017   History of attempted suicide 11/04/2017   Insomnia 11/04/2017   Moderate episode of recurrent major depressive disorder ([place]) 11/04/2017    PCP: Jeralyn Ruths  REFERRING PROVIDER: [name]  REFERRING DIAG: left shoulder pain  THERAPY DIAG:  Acute pain of left shoulder  Stiffness of left shoulder, not elsewhere classified  Rationale for Evaluation and Treatment Rehabilitation  ONSET DATE: 04/29/22  SUBJECTIVE:  SUBJECTIVE STATEMENT: "Tired, Didn't sleep very well since last night. Shoulder is always sore"  PERTINENT HISTORY: Anxiety, arthritis, fibromyalgia, HTN  PAIN:  Are you having pain? Yes: NPRS scale: 6/10 Pain location: left lateral shoulder left upper arm, c/o some numbness in the fingers Pain description: ache, sharppain >10/10 Aggravating factors: any motions, any movements all ADL's pain >10/10 Relieving factors: reports that nothing has helped the pain, pain at best a 9/10  PRECAUTIONS: None  WEIGHT  BEARING RESTRICTIONS No  FALLS:  Has patient fallen in last 6 months? No  LIVING ENVIRONMENT: Lives with: lives with their family Lives in: House/apartment Stairs: No Has following equipment at home: None  OCCUPATION: retired  PLOF: Independent Does the cooking and cleaning, some gardening  PATIENT GOALS have less pain, do better with ADL's  OBJECTIVE:   DIAGNOSTIC FINDINGS:  Degenerative changes of the cervical spine, OA changes of the left shoulder  PATIENT SURVEYS:  FOTO 16  POSTURE: Fwd head, rounded shoulders  UPPER EXTREMITY ROM: very painful and very limited motions  Active/PROM ROM Left  Eval 05/13/22 Active Left Eval 05/13/22 Passive Left  AROM 06/05/22 L shoulder AROM  06/19/22  Shoulder flexion 60 70 90 140  Shoulder extension      Shoulder abduction 30 40 90 130  Shoulder adduction      Shoulder internal rotation      Shoulder external rotation      Elbow flexion      Elbow extension      Wrist flexion      Wrist extension      Wrist ulnar deviation      Wrist radial deviation      Wrist pronation      Wrist supination      (Blank rows = not tested)  UPPER EXTREMITY MMT:  all motions very painful, could not do flexion and abduction due to pain  MMT Right eval Left Eval 05/13/22  Shoulder flexion    Shoulder extension    Shoulder abduction    Shoulder adduction    Shoulder internal rotation  4-/5  Shoulder external rotation  3+/5  Middle trapezius    Lower trapezius    Elbow flexion    Elbow extension    Wrist flexion    Wrist extension    Wrist ulnar deviation    Wrist radial deviation    Wrist pronation    Wrist supination    Grip strength (lbs)    (Blank rows = not tested)  SHOULDER SPECIAL TESTS:  Impingement tests: Neer impingement test: positive    Rotator cuff assessment: Drop arm test: positive   Biceps assessment: Yergason's test: positive   PALPATION:  Has a lot of spasms in the left upper traps, sore and tender  down the left upper arm   TODAY'S TREATMENT:  06/23/22 NuStep L2 x 6 min  Rows & Ext yellow 2x10 AAROM cane WaTE Flex, Ext, IR x10 Triceps Ext 15 2x10 Biceps curls 2lb 2x10  Bilat shoulder flex and abd x5 Vaso L shoulder low pressure 34 deg x 10 min  06/19/22 NuStep L3 x6 min  AAROM cane 1lb WaTE Flex, Ext, IR 2x5  Row red 2x10 [place]oulder Ext Red 2x10 LUE ER then Flex AROM  x5 Vasu L shoulder low pressure 34deg x10 min    06/16/22 AAROM cane Flex, Ext, IR x5 each LUE pain with flex  LUE AAROM abd cane x8 Rows yellow 2x10  STM upper trap and rhomboid Vaso left shoulder  low pressure 38 degrees IFC left shoulder  06/05/22 Thigh slides, shrugs Active abduction in sitting with elbow bent, as well as flexion STM with some vibration to the left shoulder, upper trap and rhomboid Vaso left shoulder low pressure 38 degrees IFC left shoulder  PATIENT EDUCATION: Education details: HEP Person educated: Patient Education method: Consulting civil engineer, Media planner, Verbal cues, and Handouts Education comprehension: verbalized understanding   HOME EXERCISE PROGRAM: Thigh slides, shrugs and retractions  ASSESSMENT:  CLINICAL IMPRESSION: Pt enters with reports of fatigue. Continues with an active treatment. She did well overall, L shoulder has a decrease activity tolerance. No issues with resisted rows, extensions and AAROM interventions. LUE fatigues quick with active shoulder motions at shoulder level or higher   OBJECTIVE IMPAIRMENTS decreased activity tolerance, decreased ROM, decreased strength, increased muscle spasms, impaired flexibility, impaired sensation, impaired UE functional use, improper body mechanics, postural dysfunction, and pain.   ACTIVITY LIMITATIONS carrying, lifting, sleeping, bed mobility, bathing, toileting, dressing, self feeding, reach over head, hygiene/grooming, and caring for others  PARTICIPATION LIMITATIONS: meal prep, cleaning, laundry, driving, shopping,  community activity, and yard work  [employer] POTENTIAL: Good  CLINICAL DECISION MAKING: Stable/uncomplicated  EVALUATION COMPLEXITY: Low   GOALS: Goals reviewed with patient? Yes  SHORT TERM GOALS: Target date: 05/27/22  Independent with initial HEP Goal status: met  LONG TERM GOALS: Target date: 08/04/22  Understand posture and body mechanics Goal status: INITIAL  2.  Decreaes pain 50% Goal status:ongoing  3.  Increase shoulder AROM to 120 degrees flexion Goal status: Met  4.  Increase shoulder ER to 60 degrees Goal status: INITIAL  5.  Report no difficulty doing hair or dressing Goal status: INITIAL    PLAN: PT FREQUENCY: 1-2x/week  PT DURATION: 12 weeks  PLANNED INTERVENTIONS: Therapeutic exercises, Therapeutic activity, Neuromuscular re-education, Balance training, Gait training, Patient/Family education, Joint manipulation, Joint mobilization, Dry Needling, Electrical stimulation, Cryotherapy, Moist heat, Taping, Ultrasound, and Manual therapy  PLAN FOR NEXT SESSION: If pain is better managed will try to increase exercises and introduce gym activities  Scot Jun, PTA 06/23/2022, 1:01 PM

## 2022-06-26 ENCOUNTER — Encounter: Payer: Self-pay | Admitting: Physical Therapy

## 2022-06-26 ENCOUNTER — Ambulatory Visit: Payer: Medicare HMO | Admitting: Physical Therapy

## 2022-06-26 DIAGNOSIS — M25612 Stiffness of left shoulder, not elsewhere classified: Secondary | ICD-10-CM | POA: Diagnosis not present

## 2022-06-26 DIAGNOSIS — M25512 Pain in left shoulder: Secondary | ICD-10-CM

## 2022-06-26 NOTE — Therapy (Signed)
OUTPATIENT PHYSICAL THERAPY SHOULDER EVALUATION   Patient Name: Leslie Griffin MRN: [id number] DOB:November 02, 1951, 71 y.o., female Today's Date: 06/26/2022   PT End of Session - 06/26/22 0929     Visit Number 7    Date for PT Re-Evaluation 08/13/22    PT Start Time 0929    PT Stop Time 9518    PT Time Calculation (min) 46 min    Activity Tolerance Patient tolerated treatment well    Behavior During Therapy [facility] for tasks assessed/performed             Past Medical History:  Diagnosis Date   Allergic rhinitis    Anxiety    Arthritis    Family history of colonic polyps    Fibromyalgia    Heart murmur    History of colonic polyps    Hypercholesterolemia    Hypertension    Mitral valve prolapse    Ocular migraine    Palpitations    Past Surgical History:  Procedure Laterality Date   ABDOMINAL HYSTERECTOMY     age was in her 77s   COLONOSCOPY  10/05/2017   Mild sigmoid diverticulosis. Otherwise normal colonoscopy.    cyst removal arm Left 1981   from cat scratch fever   EYE SURGERY     bilateral cataract removal   FOOT ARTHRODESIS Left 11/01/2021   Procedure: LEFT TALONAVICULAR AND SUBTALAR FUSION;  Surgeon: Newt Minion, MD;  Location: [facility];  Service: Orthopedics;  Laterality: Left;   Patient Active Problem List   Diagnosis Date Noted   Primary osteoarthritis of left shoulder 05/13/2022   Prediabetes 05/01/2022   Murmur 04/17/2022   Posterior tibial tendinitis, left leg    HLD (hyperlipidemia) 08/22/2021   Visual snow syndrome 12/30/2020   Arthropathy of lumbar facet joint 11/12/2020   Chest pain 06/14/2020   Dissociative episodes 06/14/2020   Laceration of right index finger without foreign body without damage to nail 04/03/2020   Multiple drug allergies 04/03/2020   B12 deficiency 03/13/2020   Generalized abdominal pain 03/13/2020   Low serum iron 10/03/2019   Low vitamin D level 10/03/2019   Ocular migraine 10/03/2019   Familial hypercholesterolemia  06/27/2019   Upper GI bleed 06/27/2019   Right foot pain 03/27/2019   Generalized pain 10/08/2018   Hypertension 84/16/6063   Diastolic dysfunction, left ventricle 08/18/2018   Family history of macular degeneration 08/18/2018   GERD (gastroesophageal reflux disease) 08/18/2018   History of cataract surgery 08/18/2018   History of migraine 08/18/2018   Hypertensive cardiomyopathy, without heart failure ([place]) 08/18/2018   Medication refill 08/18/2018   Mixed dyslipidemia 08/18/2018   Obesity, Class II, BMI 35-39.9 08/18/2018   Wellness examination 08/18/2018   Cardiomyopathy ([place]) 02/03/2018   Pain and swelling of ankle, right 01/27/2018   Left-sided low back pain without sciatica 12/04/2017   Fatigue 12/04/2017   Anxiety 11/04/2017   Arthralgia 11/04/2017   History of attempted suicide 11/04/2017   Insomnia 11/04/2017   Moderate episode of recurrent major depressive disorder ([place]) 11/04/2017    PCP: Jeralyn Ruths  REFERRING PROVIDER: [name]  REFERRING DIAG: left shoulder pain  THERAPY DIAG:  Acute pain of left shoulder  Stiffness of left shoulder, not elsewhere classified  Rationale for Evaluation and Treatment Rehabilitation  ONSET DATE: 04/29/22  SUBJECTIVE:  SUBJECTIVE STATEMENT: "I am doing better" pt reports being able to sweep and mop in her house yestrday.  PERTINENT HISTORY: Anxiety, arthritis, fibromyalgia, HTN  PAIN:  Are you having pain? Yes: NPRS scale: 6/10 Pain location: left lateral shoulder left upper arm, c/o some numbness in the fingers Pain description: ache, sharppain >10/10 Aggravating factors: any motions, any movements all ADL's pain >10/10 Relieving factors: reports that nothing has helped the pain, pain at best a 9/10  PRECAUTIONS: None  WEIGHT BEARING  RESTRICTIONS No  FALLS:  Has patient fallen in last 6 months? No  LIVING ENVIRONMENT: Lives with: lives with their family Lives in: House/apartment Stairs: No Has following equipment at home: None  OCCUPATION: retired  PLOF: Independent Does the cooking and cleaning, some gardening  PATIENT GOALS have less pain, do better with ADL's  OBJECTIVE:   DIAGNOSTIC FINDINGS:  Degenerative changes of the cervical spine, OA changes of the left shoulder  PATIENT SURVEYS:  FOTO 16  POSTURE: Fwd head, rounded shoulders  UPPER EXTREMITY ROM: very painful and very limited motions  Active/PROM ROM Left  Eval 05/13/22 Active Left Eval 05/13/22 Passive Left  AROM 06/05/22 L shoulder AROM  06/19/22  Shoulder flexion 60 70 90 140  Shoulder extension      Shoulder abduction 30 40 90 130  Shoulder adduction      Shoulder internal rotation      Shoulder external rotation      Elbow flexion      Elbow extension      Wrist flexion      Wrist extension      Wrist ulnar deviation      Wrist radial deviation      Wrist pronation      Wrist supination      (Blank rows = not tested)  UPPER EXTREMITY MMT:  all motions very painful, could not do flexion and abduction due to pain  MMT Right eval Left Eval 05/13/22  Shoulder flexion    Shoulder extension    Shoulder abduction    Shoulder adduction    Shoulder internal rotation  4-/5  Shoulder external rotation  3+/5  Middle trapezius    Lower trapezius    Elbow flexion    Elbow extension    Wrist flexion    Wrist extension    Wrist ulnar deviation    Wrist radial deviation    Wrist pronation    Wrist supination    Grip strength (lbs)    (Blank rows = not tested)  SHOULDER SPECIAL TESTS:  Impingement tests: Neer impingement test: positive    Rotator cuff assessment: Drop arm test: positive   Biceps assessment: Yergason's test: positive   PALPATION:  Has a lot of spasms in the left upper traps, sore and tender down the  left upper arm   TODAY'S TREATMENT:  06/26/22 UBE L1 x 3 min each Rows 10lb Lats 15lb 2x10  Shoulder Ext red 2x10 Shoulder Ext 15lb 2x15  Shoulder flex cane 2x5 Shoulder Abd  x10 Vaso L shoulder Low 34 deg x10 min   06/23/22 NuStep L2 x 6 min  Rows & Ext yellow 2x10 AAROM cane WaTE Flex, Ext, IR x10 Triceps Ext 15 2x10 Biceps curls 2lb 2x10  Bilat shoulder flex and abd x5 Vaso L shoulder low pressure 34 deg x 10 min  06/19/22 NuStep L3 x6 min  AAROM cane 1lb WaTE Flex, Ext, IR 2x5  Row red 2x10 [place] Ext Red 2x10 LUE ER then Flex  AROM  x5 Vasu L shoulder low pressure 34deg x10 min    06/16/22 AAROM cane Flex, Ext, IR x5 each LUE pain with flex  LUE AAROM abd cane x8 Rows yellow 2x10  STM upper trap and rhomboid Vaso left shoulder low pressure 38 degrees IFC left shoulder  06/05/22 Thigh slides, shrugs Active abduction in sitting with elbow bent, as well as flexion STM with some vibration to the left shoulder, upper trap and rhomboid Vaso left shoulder low pressure 38 degrees IFC left shoulder  PATIENT EDUCATION: Education details: HEP Person educated: Patient Education method: Consulting civil engineer, Media planner, Verbal cues, and Handouts Education comprehension: verbalized understanding   HOME EXERCISE PROGRAM: Thigh slides, shrugs and retractions  ASSESSMENT:  CLINICAL IMPRESSION: Pt enters reporting improvement with her L shoulder function at home stating she could mop and sweep her floors at home. Progressed to machine level interventions without issues. She does well with pulling interventions. She has the most difficulty with motions at or above shoulder height. LUE ER very limited actively  only able to get to neutral.   OBJECTIVE IMPAIRMENTS decreased activity tolerance, decreased ROM, decreased strength, increased muscle spasms, impaired flexibility, impaired sensation, impaired UE functional use, improper body mechanics, postural dysfunction, and pain.    ACTIVITY LIMITATIONS carrying, lifting, sleeping, bed mobility, bathing, toileting, dressing, self feeding, reach over head, hygiene/grooming, and caring for others  PARTICIPATION LIMITATIONS: meal prep, cleaning, laundry, driving, shopping, community activity, and yard work  [employer] POTENTIAL: Good  CLINICAL DECISION MAKING: Stable/uncomplicated  EVALUATION COMPLEXITY: Low   GOALS: Goals reviewed with patient? Yes  SHORT TERM GOALS: Target date: 05/27/22  Independent with initial HEP Goal status: met  LONG TERM GOALS: Target date: 08/04/22  Understand posture and body mechanics Goal status: met  2.  Decreaes pain 50% Goal status:ongoing  3.  Increase shoulder AROM to 120 degrees flexion Goal status: Met  4.  Increase shoulder ER to 60 degrees Goal status: ongoing  5.  Report no difficulty doing hair or dressing Goal status: progressing    PLAN: PT FREQUENCY: 1-2x/week  PT DURATION: 12 weeks  PLANNED INTERVENTIONS: Therapeutic exercises, Therapeutic activity, Neuromuscular re-education, Balance training, Gait training, Patient/Family education, Joint manipulation, Joint mobilization, Dry Needling, Electrical stimulation, Cryotherapy, Moist heat, Taping, Ultrasound, and Manual therapy  PLAN FOR NEXT SESSION: If pain is better managed will try to increase exercises and introduce gym activities  Scot Jun, PTA 06/26/2022, 9:30 AM

## 2022-06-30 ENCOUNTER — Ambulatory Visit: Payer: Medicare HMO | Admitting: Orthopedic Surgery

## 2022-06-30 ENCOUNTER — Encounter: Payer: Self-pay | Admitting: Orthopedic Surgery

## 2022-06-30 DIAGNOSIS — M12812 Other specific arthropathies, not elsewhere classified, left shoulder: Secondary | ICD-10-CM | POA: Diagnosis not present

## 2022-06-30 NOTE — Progress Notes (Signed)
Office Visit Note   Patient: Leslie Griffin           Date of Birth: [birth year]           MRN: [id number] Visit Date: 06/30/2022 Requested by: Ailene Ards, NP [place],   [id number] PCP: Ailene Ards, NP  Subjective: Chief Complaint  Patient presents with   Left Shoulder - Follow-up    HPI: Leslie Griffin is a 71 year old patient with left shoulder massive rotator cuff tear.  Has been going to physical therapy.  Decision point today was for or against reverse shoulder replacement.  Overall she states her range of motion has improved.  Pain is still there.  Forcing herself to use her left upper extremity.  She was able to sweep and mop her entire father.              ROS: All systems reviewed are negative as they relate to the chief complaint within the history of present illness.  Patient denies  fevers or chills.   Assessment & Plan: Visit Diagnoses: No diagnosis found.  Plan: Impression is left shoulder massive rotator cuff tear with pretty reasonable functional recovery in terms of active range of motion.  Still weak with external rotation as expected.  Plan is to see how much she progresses with continued strengthening.  Pain may not completely go away but he could improve enough that it with him is functional for her.  Do not recommend surgical intervention at this time.  Follow-up as needed.  Follow-Up Instructions: No follow-ups on file.   Orders:  No orders of the defined types were placed in this encounter.  No orders of the defined types were placed in this encounter.     Procedures: No procedures performed   Clinical Data: No additional findings.  Objective: Vital Signs: There were no vitals taken for this visit.  Physical Exam:   Constitutional: Patient appears well-developed HEENT:  Head: Normocephalic Eyes:EOM are normal Neck: Normal range of motion Cardiovascular: Normal rate Pulmonary/chest: Effort normal Neurologic: Patient is  alert Skin: Skin is warm Psychiatric: Patient has normal mood and affect   Ortho Exam: Ortho exam demonstrates active range of motion on the left 170 forward flexion 130 abduction.  Still has 3 out of 5 strength in external rotation on the left versus right.  No Popeye deformity.  Deltoid is functional.  Specialty Comments:  No specialty comments available.  Imaging: No results found.   PMFS History: Patient Active Problem List   Diagnosis Date Noted   Primary osteoarthritis of left shoulder 05/13/2022   Prediabetes 05/01/2022   Murmur 04/17/2022   Posterior tibial tendinitis, left leg    HLD (hyperlipidemia) 08/22/2021   Visual snow syndrome 12/30/2020   Arthropathy of lumbar facet joint 11/12/2020   Chest pain 06/14/2020   Dissociative episodes 06/14/2020   Laceration of right index finger without foreign body without damage to nail 04/03/2020   Multiple drug allergies 04/03/2020   B12 deficiency 03/13/2020   Generalized abdominal pain 03/13/2020   Low serum iron 10/03/2019   Low vitamin D level 10/03/2019   Ocular migraine 10/03/2019   Familial hypercholesterolemia 06/27/2019   Upper GI bleed 06/27/2019   Right foot pain 03/27/2019   Generalized pain 10/08/2018   Hypertension 23/76/2831   Diastolic dysfunction, left ventricle 08/18/2018   Family history of macular degeneration 08/18/2018   GERD (gastroesophageal reflux disease) 08/18/2018   History of cataract surgery 08/18/2018   History of migraine  08/18/2018   Hypertensive cardiomyopathy, without heart failure ([place]) 08/18/2018   Medication refill 08/18/2018   Mixed dyslipidemia 08/18/2018   Obesity, Class II, BMI 35-39.9 08/18/2018   Wellness examination 08/18/2018   Cardiomyopathy ([place]) 02/03/2018   Pain and swelling of ankle, right 01/27/2018   Left-sided low back pain without sciatica 12/04/2017   Fatigue 12/04/2017   Anxiety 11/04/2017   Arthralgia 11/04/2017   History of attempted suicide 11/04/2017    Insomnia 11/04/2017   Moderate episode of recurrent major depressive disorder ([place]) 11/04/2017   Past Medical History:  Diagnosis Date   Allergic rhinitis    Anxiety    Arthritis    Family history of colonic polyps    Fibromyalgia    Heart murmur    History of colonic polyps    Hypercholesterolemia    Hypertension    Mitral valve prolapse    Ocular migraine    Palpitations     Family History  Problem Relation Age of Onset   Coronary artery disease Other    Heart disease Mother    Heart disease Father    Colon polyps Sister    Other Sister        low hemoglobin   Colon cancer Neg Hx    Esophageal cancer Neg Hx    Rectal cancer Neg Hx    Stomach cancer Neg Hx     Past Surgical History:  Procedure Laterality Date   ABDOMINAL HYSTERECTOMY     age was in her 17s   COLONOSCOPY  10/05/2017   Mild sigmoid diverticulosis. Otherwise normal colonoscopy.    cyst removal arm Left 1981   from cat scratch fever   EYE SURGERY     bilateral cataract removal   FOOT ARTHRODESIS Left 11/01/2021   Procedure: LEFT TALONAVICULAR AND SUBTALAR FUSION;  Surgeon: Newt Minion, MD;  Location: [facility];  Service: Orthopedics;  Laterality: Left;   Social History   Occupational History   Not on file  Tobacco Use   Smoking status: Never   Smokeless tobacco: Never  Vaping Use   Vaping Use: Never used  Substance and Sexual Activity   Alcohol use: Yes    Comment: ocassionally    Drug use: No   Sexual activity: Not on file

## 2022-07-01 ENCOUNTER — Encounter: Payer: Self-pay | Admitting: Physical Therapy

## 2022-07-01 ENCOUNTER — Ambulatory Visit: Payer: Medicare HMO | Admitting: Physical Therapy

## 2022-07-01 DIAGNOSIS — M25612 Stiffness of left shoulder, not elsewhere classified: Secondary | ICD-10-CM | POA: Diagnosis not present

## 2022-07-01 DIAGNOSIS — M25512 Pain in left shoulder: Secondary | ICD-10-CM

## 2022-07-01 NOTE — Therapy (Signed)
OUTPATIENT PHYSICAL THERAPY SHOULDER EVALUATION   Patient Name: Leslie Griffin MRN: [id number] DOB:[birth year], 71 y.o., female Today's Date: 07/01/2022   PT End of Session - 07/01/22 1026     Visit Number 8    Date for PT Re-Evaluation 08/13/22    PT Start Time 1026    PT Stop Time 1100    PT Time Calculation (min) 34 min    Activity Tolerance Patient tolerated treatment well    Behavior During Therapy WFL for tasks assessed/performed             Past Medical History:  Diagnosis Date   Allergic rhinitis    Anxiety    Arthritis    Family history of colonic polyps    Fibromyalgia    Heart murmur    History of colonic polyps    Hypercholesterolemia    Hypertension    Mitral valve prolapse    Ocular migraine    Palpitations    Past Surgical History:  Procedure Laterality Date   ABDOMINAL HYSTERECTOMY     age was in her 81s   COLONOSCOPY  10/05/2017   Mild sigmoid diverticulosis. Otherwise normal colonoscopy.    cyst removal arm Left 1981   from cat scratch fever   EYE SURGERY     bilateral cataract removal   FOOT ARTHRODESIS Left 11/01/2021   Procedure: LEFT TALONAVICULAR AND SUBTALAR FUSION;  Surgeon: Newt Minion, MD;  Location: [facility];  Service: Orthopedics;  Laterality: Left;   Patient Active Problem List   Diagnosis Date Noted   Primary osteoarthritis of left shoulder 05/13/2022   Prediabetes 05/01/2022   Murmur 04/17/2022   Posterior tibial tendinitis, left leg    HLD (hyperlipidemia) 08/22/2021   Visual snow syndrome 12/30/2020   Arthropathy of lumbar facet joint 11/12/2020   Chest pain 06/14/2020   Dissociative episodes 06/14/2020   Laceration of right index finger without foreign body without damage to nail 04/03/2020   Multiple drug allergies 04/03/2020   B12 deficiency 03/13/2020   Generalized abdominal pain 03/13/2020   Low serum iron 10/03/2019   Low vitamin D level 10/03/2019   Ocular migraine 10/03/2019   Familial hypercholesterolemia  06/27/2019   Upper GI bleed 06/27/2019   Right foot pain 03/27/2019   Generalized pain 10/08/2018   Hypertension 86/57/8469   Diastolic dysfunction, left ventricle 08/18/2018   Family history of macular degeneration 08/18/2018   GERD (gastroesophageal reflux disease) 08/18/2018   History of cataract surgery 08/18/2018   History of migraine 08/18/2018   Hypertensive cardiomyopathy, without heart failure ([place]) 08/18/2018   Medication refill 08/18/2018   Mixed dyslipidemia 08/18/2018   Obesity, Class II, BMI 35-39.9 08/18/2018   Wellness examination 08/18/2018   Cardiomyopathy ([place]) 02/03/2018   Pain and swelling of ankle, right 01/27/2018   Left-sided low back pain without sciatica 12/04/2017   Fatigue 12/04/2017   Anxiety 11/04/2017   Arthralgia 11/04/2017   History of attempted suicide 11/04/2017   Insomnia 11/04/2017   Moderate episode of recurrent major depressive disorder ([place]) 11/04/2017    PCP: Jeralyn Ruths  REFERRING PROVIDER: [name]  REFERRING DIAG: left shoulder pain  THERAPY DIAG:  Acute pain of left shoulder  Stiffness of left shoulder, not elsewhere classified  Rationale for Evaluation and Treatment Rehabilitation  ONSET DATE: 04/29/22  SUBJECTIVE:  SUBJECTIVE STATEMENT: "Doing ok" Seen MD yesterday and he's not recommending surgery  PERTINENT HISTORY: Anxiety, arthritis, fibromyalgia, HTN  PAIN:  Are you having pain? Yes: NPRS scale: 5-6/10 Pain location: left lateral shoulder left upper arm, c/o some numbness in the fingers Pain description: ache, sharppain >10/10 Aggravating factors: any motions, any movements all ADL's pain >10/10 Relieving factors: reports that nothing has helped the pain, pain at best a 9/10   FALLS:  Has patient fallen in last 6 months?  No  LIVING ENVIRONMENT: Lives with: lives with their family Lives in: House/apartment Stairs: No Has following equipment at home: None  OCCUPATION: retired  PLOF: Independent Does the cooking and cleaning, some gardening  PATIENT GOALS have less pain, do better with ADL's  OBJECTIVE:   DIAGNOSTIC FINDINGS:  Degenerative changes of the cervical spine, OA changes of the left shoulder  PATIENT SURVEYS:  FOTO 16  POSTURE: Fwd head, rounded shoulders  UPPER EXTREMITY ROM: very painful and very limited motions  Active/PROM ROM Left  Eval 05/13/22 Active Left Eval 05/13/22 Passive Left  AROM 06/05/22 L shoulder AROM  06/19/22  Shoulder flexion 60 70 90 140  Shoulder extension      Shoulder abduction 30 40 90 130  Shoulder adduction      Shoulder internal rotation      Shoulder external rotation      Elbow flexion      Elbow extension      Wrist flexion      Wrist extension      Wrist ulnar deviation      Wrist radial deviation      Wrist pronation      Wrist supination      (Blank rows = not tested)  UPPER EXTREMITY MMT:  all motions very painful, could not do flexion and abduction due to pain  MMT Right eval Left Eval 05/13/22  Shoulder flexion    Shoulder extension    Shoulder abduction    Shoulder adduction    Shoulder internal rotation  4-/5  Shoulder external rotation  3+/5  Middle trapezius    Lower trapezius    Elbow flexion    Elbow extension    Wrist flexion    Wrist extension    Wrist ulnar deviation    Wrist radial deviation    Wrist pronation    Wrist supination    Grip strength (lbs)    (Blank rows = not tested)  SHOULDER SPECIAL TESTS:  Impingement tests: Neer impingement test: positive    Rotator cuff assessment: Drop arm test: positive   Biceps assessment: Yergason's test: positive   PALPATION:  Has a lot of spasms in the left upper traps, sore and tender down the left upper arm   TODAY'S TREATMENT:  07/01/22 UBE 1.5 x 3 min  each Seated rows & Lats  15lb 2x10 Shoulder Ext 5lb 2x10 Shoulder Abd 2x10 AROM  Triceps Ext 20lb 2x10 Biceps curls 3lb 2x10 Some LUE PROM  Vaso L shoulder Low 34 deg x10 min    06/26/22 UBE L1 x 3 min each Rows 10lb Lats 15lb 2x10  Shoulder Ext red 2x10 Shoulder Ext 15lb 2x15  Shoulder flex cane 2x5 Shoulder Abd  x10 Vaso L shoulder Low 34 deg x10 min   06/23/22 NuStep L2 x 6 min  Rows & Ext yellow 2x10 AAROM cane WaTE Flex, Ext, IR x10 Triceps Ext 15 2x10 Biceps curls 2lb 2x10  Bilat shoulder flex and abd x5 Vaso L shoulder low  pressure 34 deg x 10 min  06/19/22 NuStep L3 x6 min  AAROM cane 1lb WaTE Flex, Ext, IR 2x5  Row red 2x10 [place] Ext Red 2x10 LUE ER then Flex AROM  x5 Vasu L shoulder low pressure 34deg x10 min    06/16/22 AAROM cane Flex, Ext, IR x5 each LUE pain with flex  LUE AAROM abd cane x8 Rows yellow 2x10  STM upper trap and rhomboid Vaso left shoulder low pressure 38 degrees IFC left shoulder  06/05/22 Thigh slides, shrugs Active abduction in sitting with elbow bent, as well as flexion STM with some vibration to the left shoulder, upper trap and rhomboid Vaso left shoulder low pressure 38 degrees IFC left shoulder  PATIENT EDUCATION: Education details: HEP Person educated: Patient Education method: Consulting civil engineer, Media planner, Verbal cues, and Handouts Education comprehension: verbalized understanding   HOME EXERCISE PROGRAM: Thigh slides, shrugs and retractions  ASSESSMENT:  CLINICAL IMPRESSION: Pt enters doing well ~ 11 minutes late. She continues to have the most difficulty with motions at or above shoulder height. Increase weight tolerated with seated rows. Pt did well with shoulder abd but flexion cause a catch and pain. LUE ER is very weak but does have full passive ER.  OBJECTIVE IMPAIRMENTS decreased activity tolerance, decreased ROM, decreased strength, increased muscle spasms, impaired flexibility, impaired sensation, impaired  UE functional use, improper body mechanics, postural dysfunction, and pain.   ACTIVITY LIMITATIONS carrying, lifting, sleeping, bed mobility, bathing, toileting, dressing, self feeding, reach over head, hygiene/grooming, and caring for others  PARTICIPATION LIMITATIONS: meal prep, cleaning, laundry, driving, shopping, community activity, and yard work  [employer] POTENTIAL: Good  CLINICAL DECISION MAKING: Stable/uncomplicated  EVALUATION COMPLEXITY: Low   GOALS: Goals reviewed with patient? Yes  SHORT TERM GOALS: Target date: 05/27/22  Independent with initial HEP Goal status: met  LONG TERM GOALS: Target date: 08/04/22  Understand posture and body mechanics Goal status: met  2.  Decreaes pain 50% Goal status:ongoing  3.  Increase shoulder AROM to 120 degrees flexion Goal status: Met  4.  Increase shoulder ER to 60 degrees Goal status: ongoing  5.  Report no difficulty doing hair or dressing Goal status: progressing    PLAN: PT FREQUENCY: 1-2x/week  PT DURATION: 12 weeks  PLANNED INTERVENTIONS: Therapeutic exercises, Therapeutic activity, Neuromuscular re-education, Balance training, Gait training, Patient/Family education, Joint manipulation, Joint mobilization, Dry Needling, Electrical stimulation, Cryotherapy, Moist heat, Taping, Ultrasound, and Manual therapy  PLAN FOR NEXT SESSION: If pain is better managed will try to increase exercises and introduce gym activities  Scot Jun, PTA 07/01/2022, 10:27 AM

## 2022-07-03 ENCOUNTER — Ambulatory Visit: Payer: Medicare HMO | Admitting: Physical Therapy

## 2022-07-03 ENCOUNTER — Encounter: Payer: Self-pay | Admitting: Physical Therapy

## 2022-07-03 DIAGNOSIS — M25512 Pain in left shoulder: Secondary | ICD-10-CM | POA: Diagnosis not present

## 2022-07-03 DIAGNOSIS — M25612 Stiffness of left shoulder, not elsewhere classified: Secondary | ICD-10-CM | POA: Diagnosis not present

## 2022-07-03 NOTE — Therapy (Signed)
OUTPATIENT PHYSICAL THERAPY SHOULDER EVALUATION   Patient Name: Leslie Griffin MRN: [id number] DOB:1950-12-12, 71 y.o., female Today's Date: 07/03/2022   PT End of Session - 07/03/22 0838     Visit Number 9    Date for PT Re-Evaluation 08/13/22    Authorization Type [employer]    PT Start Time 0840    PT Stop Time 0920    PT Time Calculation (min) 40 min    Activity Tolerance Patient tolerated treatment well    Behavior During Therapy [facility] for tasks assessed/performed             Past Medical History:  Diagnosis Date   Allergic rhinitis    Anxiety    Arthritis    Family history of colonic polyps    Fibromyalgia    Heart murmur    History of colonic polyps    Hypercholesterolemia    Hypertension    Mitral valve prolapse    Ocular migraine    Palpitations    Past Surgical History:  Procedure Laterality Date   ABDOMINAL HYSTERECTOMY     age was in her 40s   COLONOSCOPY  10/05/2017   Mild sigmoid diverticulosis. Otherwise normal colonoscopy.    cyst removal arm Left 1981   from cat scratch fever   EYE SURGERY     bilateral cataract removal   FOOT ARTHRODESIS Left 11/01/2021   Procedure: LEFT TALONAVICULAR AND SUBTALAR FUSION;  Surgeon: Newt Minion, MD;  Location: [place];  Service: Orthopedics;  Laterality: Left;   Patient Active Problem List   Diagnosis Date Noted   Primary osteoarthritis of left shoulder 05/13/2022   Prediabetes 05/01/2022   Murmur 04/17/2022   Posterior tibial tendinitis, left leg    HLD (hyperlipidemia) 08/22/2021   Visual snow syndrome 12/30/2020   Arthropathy of lumbar facet joint 11/12/2020   Chest pain 06/14/2020   Dissociative episodes 06/14/2020   Laceration of right index finger without foreign body without damage to nail 04/03/2020   Multiple drug allergies 04/03/2020   B12 deficiency 03/13/2020   Generalized abdominal pain 03/13/2020   Low serum iron 10/03/2019   Low vitamin D level 10/03/2019   Ocular migraine 10/03/2019    Familial hypercholesterolemia 06/27/2019   Upper GI bleed 06/27/2019   Right foot pain 03/27/2019   Generalized pain 10/08/2018   Hypertension 75/44/9201   Diastolic dysfunction, left ventricle 08/18/2018   Family history of macular degeneration 08/18/2018   GERD (gastroesophageal reflux disease) 08/18/2018   History of cataract surgery 08/18/2018   History of migraine 08/18/2018   Hypertensive cardiomyopathy, without heart failure ([place]) 08/18/2018   Medication refill 08/18/2018   Mixed dyslipidemia 08/18/2018   Obesity, Class II, BMI 35-39.9 08/18/2018   Wellness examination 08/18/2018   Cardiomyopathy ([place]) 02/03/2018   Pain and swelling of ankle, right 01/27/2018   Left-sided low back pain without sciatica 12/04/2017   Fatigue 12/04/2017   Anxiety 11/04/2017   Arthralgia 11/04/2017   History of attempted suicide 11/04/2017   Insomnia 11/04/2017   Moderate episode of recurrent major depressive disorder ([place]) 11/04/2017    PCP: Jeralyn Ruths  REFERRING PROVIDER: [name]  REFERRING DIAG: left shoulder pain  THERAPY DIAG:  Acute pain of left shoulder  Stiffness of left shoulder, not elsewhere classified  Rationale for Evaluation and Treatment Rehabilitation  ONSET DATE: 04/29/22  SUBJECTIVE:  SUBJECTIVE STATEMENT: "Doing well"  PERTINENT HISTORY: Anxiety, arthritis, fibromyalgia, HTN  PAIN:  Are you having pain? Yes: NPRS scale: 4/10 Pain location: left lateral shoulder left upper arm, c/o some numbness in the fingers Pain description: ache, sharppain >10/10 Aggravating factors: any motions, any movements all ADL's pain >10/10 Relieving factors: reports that nothing has helped the pain, pain at best a 9/10   FALLS:  Has patient fallen in last 6 months? No  LIVING  ENVIRONMENT: Lives with: lives with their family Lives in: House/apartment Stairs: No Has following equipment at home: None   PLOF: Independent Does the cooking and cleaning, some gardening  PATIENT GOALS have less pain, do better with ADL's  OBJECTIVE:   DIAGNOSTIC FINDINGS:  Degenerative changes of the cervical spine, OA changes of the left shoulder  PATIENT SURVEYS:  FOTO 16  POSTURE: Fwd head, rounded shoulders  UPPER EXTREMITY ROM: very painful and very limited motions  Active/PROM ROM Left  Eval 05/13/22 Active Left Eval 05/13/22 Passive Left  AROM 06/05/22 L shoulder AROM  06/19/22 L shoulder AROM 07/03/22  Shoulder flexion 60 70 90 140 153  Shoulder extension       Shoulder abduction 30 40 90 130 152  Shoulder adduction       Shoulder internal rotation       Shoulder external rotation       Elbow flexion       Elbow extension       Wrist flexion       Wrist extension       Wrist ulnar deviation       Wrist radial deviation       Wrist pronation       Wrist supination       (Blank rows = not tested)  UPPER EXTREMITY MMT:  all motions very painful, could not do flexion and abduction due to pain  MMT Right eval Left Eval 05/13/22  Shoulder flexion    Shoulder extension    Shoulder abduction    Shoulder adduction    Shoulder internal rotation  4-/5  Shoulder external rotation  3+/5  Middle trapezius    Lower trapezius    Elbow flexion    Elbow extension    Wrist flexion    Wrist extension    Wrist ulnar deviation    Wrist radial deviation    Wrist pronation    Wrist supination    Grip strength (lbs)    (Blank rows = not tested)  SHOULDER SPECIAL TESTS:  Impingement tests: Neer impingement test: positive    Rotator cuff assessment: Drop arm test: positive   Biceps assessment: Yergason's test: positive   PALPATION:  Has a lot of spasms in the left upper traps, sore and tender down the left upper arm   TODAY'S TREATMENT:  07/03/22 UBE L1.5  x3 min each Seated rows 20lb & Lats 20lb 2x10. Chest press 5lb 2x10  Shoulder Ext 5lb 2x10 Shoulder Flex 2x10 AROM  Shoulder Abd 2x10 Triceps Ext 20lb 2x10 Biceps Curls 2lb 2x10 Wall circles with pillowcase  1lb flexion up wall with pillow case LUE x10 fatigue aster 7 reps Vaso L shoulder low pressure 34 deg x 10 min   07/01/22 UBE 1.5 x 3 min each Seated rows & Lats  15lb 2x10 Shoulder Ext 5lb 2x10 Shoulder Abd 2x10 AROM  Triceps Ext 20lb 2x10 Biceps curls 3lb 2x10 Some LUE PROM  Vaso L shoulder Low 34 deg x10 min    06/26/22 UBE  L1 x 3 min each Rows 10lb Lats 15lb 2x10  Shoulder Ext red 2x10 Shoulder Ext 15lb 2x15  Shoulder flex cane 2x5 Shoulder Abd  x10 Vaso L shoulder Low 34 deg x10 min   06/23/22 NuStep L2 x 6 min  Rows & Ext yellow 2x10 AAROM cane WaTE Flex, Ext, IR x10 Triceps Ext 15 2x10 Biceps curls 2lb 2x10  Bilat shoulder flex and abd x5 Vaso L shoulder low pressure 34 deg x 10 min  06/19/22 NuStep L3 x6 min  AAROM cane 1lb WaTE Flex, Ext, IR 2x5  Row red 2x10 [place] Ext Red 2x10 LUE ER then Flex AROM  x5 Vasu L shoulder low pressure 34deg x10 min  PATIENT EDUCATION: Education details: HEP Person educated: Patient Education method: Consulting civil engineer, Media planner, Verbal cues, and Handouts Education comprehension: verbalized understanding   HOME EXERCISE PROGRAM: Thigh slides, shrugs and retractions  ASSESSMENT:  CLINICAL IMPRESSION: Pt enters doing well with a slightly lower pain rating than normal. Pt has progressed increasing her L shoulder AORM. She continues to have the most difficulty with motions at or above shoulder height. Increase weight tolerated with lat pull downs. Continues to progress with L shoulder strength and function. LUE ER is very weak but does have full passive ER.  OBJECTIVE IMPAIRMENTS decreased activity tolerance, decreased ROM, decreased strength, increased muscle spasms, impaired flexibility, impaired sensation, impaired  UE functional use, improper body mechanics, postural dysfunction, and pain.   ACTIVITY LIMITATIONS carrying, lifting, sleeping, bed mobility, bathing, toileting, dressing, self feeding, reach over head, hygiene/grooming, and caring for others  PARTICIPATION LIMITATIONS: meal prep, cleaning, laundry, driving, shopping, community activity, and yard work  [employer] POTENTIAL: Good  CLINICAL DECISION MAKING: Stable/uncomplicated  EVALUATION COMPLEXITY: Low   GOALS: Goals reviewed with patient? Yes  SHORT TERM GOALS: Target date: 05/27/22  Independent with initial HEP Goal status: met  LONG TERM GOALS: Target date: 08/04/22  Understand posture and body mechanics Goal status: met  2.  Decreaes pain 50% Goal status:ongoing  3.  Increase shoulder AROM to 120 degrees flexion Goal status: Met  4.  Increase shoulder ER to 60 degrees Goal status: ongoing  5.  Report no difficulty doing hair or dressing Goal status: progressing    PLAN: PT FREQUENCY: 1-2x/week  PT DURATION: 12 weeks  PLANNED INTERVENTIONS: Therapeutic exercises, Therapeutic activity, Neuromuscular re-education, Balance training, Gait training, Patient/Family education, Joint manipulation, Joint mobilization, Dry Needling, Electrical stimulation, Cryotherapy, Moist heat, Taping, Ultrasound, and Manual therapy  PLAN FOR NEXT SESSION: If pain is better managed will try to increase exercises and introduce gym activities  Scot Jun, PTA 07/03/2022, 8:39 AM

## 2022-07-07 ENCOUNTER — Ambulatory Visit: Payer: Medicare HMO | Admitting: Physical Therapy

## 2022-07-07 ENCOUNTER — Encounter: Payer: Self-pay | Admitting: Physical Therapy

## 2022-07-07 DIAGNOSIS — M25612 Stiffness of left shoulder, not elsewhere classified: Secondary | ICD-10-CM

## 2022-07-07 DIAGNOSIS — M25512 Pain in left shoulder: Secondary | ICD-10-CM | POA: Diagnosis not present

## 2022-07-07 NOTE — Therapy (Signed)
OUTPATIENT PHYSICAL THERAPY SHOULDER EVALUATION Progress Note Reporting Period 06/02/22 to 07/07/22  See note below for Objective Data and Assessment of Progress/Goals.      Patient Name: Leslie Griffin MRN: [id number] DOB:April 21, 1951, 71 y.o., female Today's Date: 07/07/2022   PT End of Session - 07/07/22 0846     Visit Number 10    Date for PT Re-Evaluation 08/13/22    Authorization Type [employer]na Medicare    PT Start Time 0840    PT Stop Time 0935    PT Time Calculation (min) 55 min    Activity Tolerance Patient tolerated treatment well    Behavior During Therapy [facility] for tasks assessed/performed             Past Medical History:  Diagnosis Date   Allergic rhinitis    Anxiety    Arthritis    Family history of colonic polyps    Fibromyalgia    Heart murmur    History of colonic polyps    Hypercholesterolemia    Hypertension    Mitral valve prolapse    Ocular migraine    Palpitations    Past Surgical History:  Procedure Laterality Date   ABDOMINAL HYSTERECTOMY     age was in her 68s   COLONOSCOPY  10/05/2017   Mild sigmoid diverticulosis. Otherwise normal colonoscopy.    cyst removal arm Left 1981   from cat scratch fever   EYE SURGERY     bilateral cataract removal   FOOT ARTHRODESIS Left 11/01/2021   Procedure: LEFT TALONAVICULAR AND SUBTALAR FUSION;  Surgeon: Newt Minion, MD;  Location: [place];  Service: Orthopedics;  Laterality: Left;   Patient Active Problem List   Diagnosis Date Noted   Primary osteoarthritis of left shoulder 05/13/2022   Prediabetes 05/01/2022   Murmur 04/17/2022   Posterior tibial tendinitis, left leg    HLD (hyperlipidemia) 08/22/2021   Visual snow syndrome 12/30/2020   Arthropathy of lumbar facet joint 11/12/2020   Chest pain 06/14/2020   Dissociative episodes 06/14/2020   Laceration of right index finger without foreign body without damage to nail 04/03/2020   Multiple drug allergies 04/03/2020   B12 deficiency 03/13/2020    Generalized abdominal pain 03/13/2020   Low serum iron 10/03/2019   Low vitamin D level 10/03/2019   Ocular migraine 10/03/2019   Familial hypercholesterolemia 06/27/2019   Upper GI bleed 06/27/2019   Right foot pain 03/27/2019   Generalized pain 10/08/2018   Hypertension 39/76/7341   Diastolic dysfunction, left ventricle 08/18/2018   Family history of macular degeneration 08/18/2018   GERD (gastroesophageal reflux disease) 08/18/2018   History of cataract surgery 08/18/2018   History of migraine 08/18/2018   Hypertensive cardiomyopathy, without heart failure ([place]) 08/18/2018   Medication refill 08/18/2018   Mixed dyslipidemia 08/18/2018   Obesity, Class II, BMI 35-39.9 08/18/2018   Wellness examination 08/18/2018   Cardiomyopathy ([place]) 02/03/2018   Pain and swelling of ankle, right 01/27/2018   Left-sided low back pain without sciatica 12/04/2017   Fatigue 12/04/2017   Anxiety 11/04/2017   Arthralgia 11/04/2017   History of attempted suicide 11/04/2017   Insomnia 11/04/2017   Moderate episode of recurrent major depressive disorder ([place]) 11/04/2017    PCP: Jeralyn Ruths  REFERRING PROVIDER: [name]  REFERRING DIAG: left shoulder pain  THERAPY DIAG:  Acute pain of left shoulder  Stiffness of left shoulder, not elsewhere classified  Rationale for Evaluation and Treatment Rehabilitation  ONSET DATE: 04/29/22  SUBJECTIVE:  SUBJECTIVE STATEMENT: I saw the doctor last week, very pleased with my progress, putting off surgery for now PERTINENT HISTORY: Anxiety, arthritis, fibromyalgia, HTN  PAIN:  Are you having pain? Yes: NPRS scale: 4/10 Pain location: left lateral shoulder left upper arm, c/o some numbness in the fingers Pain description: ache, sharppain >10/10 Aggravating factors: any  motions, any movements all ADL's pain >10/10 Relieving factors: reports that nothing has helped the pain, pain at best a 9/10   FALLS:  Has patient fallen in last 6 months? No  LIVING ENVIRONMENT: Lives with: lives with their family Lives in: House/apartment Stairs: No Has following equipment at home: None   PLOF: Independent Does the cooking and cleaning, some gardening  PATIENT GOALS have less pain, do better with ADL's  OBJECTIVE:   DIAGNOSTIC FINDINGS:  Degenerative changes of the cervical spine, OA changes of the left shoulder  PATIENT SURVEYS:  FOTO 16  POSTURE: Fwd head, rounded shoulders  UPPER EXTREMITY ROM: very painful and very limited motions  Active/PROM ROM Left  Eval 05/13/22 Active Left Eval 05/13/22 Passive Left  AROM 06/05/22 L shoulder AROM  06/19/22 L shoulder AROM 07/03/22  Shoulder flexion 60 70 90 140 153  Shoulder extension       Shoulder abduction 30 40 90 130 152  Shoulder adduction       Shoulder internal rotation       Shoulder external rotation       Elbow flexion       Elbow extension       Wrist flexion       Wrist extension       Wrist ulnar deviation       Wrist radial deviation       Wrist pronation       Wrist supination       (Blank rows = not tested)  UPPER EXTREMITY MMT:  all motions very painful, could not do flexion and abduction due to pain  MMT Right eval Left Eval 05/13/22  Shoulder flexion    Shoulder extension    Shoulder abduction    Shoulder adduction    Shoulder internal rotation  4-/5  Shoulder external rotation  3+/5  Middle trapezius    Lower trapezius    Elbow flexion    Elbow extension    Wrist flexion    Wrist extension    Wrist ulnar deviation    Wrist radial deviation    Wrist pronation    Wrist supination    Grip strength (lbs)    (Blank rows = not tested)  SHOULDER SPECIAL TESTS:  Impingement tests: Neer impingement test: positive    Rotator cuff assessment: Drop arm test: positive    Biceps assessment: Yergason's test: positive   PALPATION:  Has a lot of spasms in the left upper traps, sore and tender down the left upper arm   TODAY'S TREATMENT:  07/07/22 UBE level 2 x 6 minutes Wall slides and wall circles with 1# and without weight, tried some scaption Chest press 5# 2x10 Seated rows 20# 2x10 Lats 20# 2x10 Yellow tband ER very minimal motions Yellow tband small abduction very weak Seated ER at 90 degrees abduction Used UE ranger with yellow tband all motions with the resistance 3# bar biceps, extension, behind back IR Vaso medium pressure left shoulder   07/03/22 UBE L1.5 x3 min each Seated rows 20lb & Lats 20lb 2x10. Chest press 5lb 2x10  Shoulder Ext 5lb 2x10 Shoulder Flex 2x10 AROM  Shoulder Abd  2x10 Triceps Ext 20lb 2x10 Biceps Curls 2lb 2x10 Wall circles with pillowcase  1lb flexion up wall with pillow case LUE x10 fatigue aster 7 reps Vaso L shoulder low pressure 34 deg x 10 min   07/01/22 UBE 1.5 x 3 min each Seated rows & Lats  15lb 2x10 Shoulder Ext 5lb 2x10 Shoulder Abd 2x10 AROM  Triceps Ext 20lb 2x10 Biceps curls 3lb 2x10 Some LUE PROM  Vaso L shoulder Low 34 deg x10 min    06/26/22 UBE L1 x 3 min each Rows 10lb Lats 15lb 2x10  Shoulder Ext red 2x10 Shoulder Ext 15lb 2x15  Shoulder flex cane 2x5 Shoulder Abd  x10 Vaso L shoulder Low 34 deg x10 min   06/23/22 NuStep L2 x 6 min  Rows & Ext yellow 2x10 AAROM cane WaTE Flex, Ext, IR x10 Triceps Ext 15 2x10 Biceps curls 2lb 2x10  Bilat shoulder flex and abd x5 Vaso L shoulder low pressure 34 deg x 10 min  06/19/22 NuStep L3 x6 min  AAROM cane 1lb WaTE Flex, Ext, IR 2x5  Row red 2x10 [place] Ext Red 2x10 LUE ER then Flex AROM  x5 Vasu L shoulder low pressure 34deg x10 min  PATIENT EDUCATION: Education details: HEP Person educated: Patient Education method: Consulting civil engineer, Media planner, Verbal cues, and Handouts Education comprehension: verbalized understanding   HOME  EXERCISE PROGRAM: Thigh slides, shrugs and retractions  ASSESSMENT:  CLINICAL IMPRESSION: Patient improving overall, still very weak ER but as the MD stated in his note this is expected with the massive RC tears that she has, we are working to gain as much function as we can.  OBJECTIVE IMPAIRMENTS decreased activity tolerance, decreased ROM, decreased strength, increased muscle spasms, impaired flexibility, impaired sensation, impaired UE functional use, improper body mechanics, postural dysfunction, and pain.   ACTIVITY LIMITATIONS carrying, lifting, sleeping, bed mobility, bathing, toileting, dressing, self feeding, reach over head, hygiene/grooming, and caring for others  PARTICIPATION LIMITATIONS: meal prep, cleaning, laundry, driving, shopping, community activity, and yard work  [employer] POTENTIAL: Good  CLINICAL DECISION MAKING: Stable/uncomplicated  EVALUATION COMPLEXITY: Low   GOALS: Goals reviewed with patient? Yes  SHORT TERM GOALS: Target date: 05/27/22  Independent with initial HEP Goal status: met  LONG TERM GOALS: Target date: 08/04/22  Understand posture and body mechanics Goal status: met  2.  Decreaes pain 50% Goal status:ongoing  3.  Increase shoulder AROM to 120 degrees flexion Goal status: Met  4.  Increase shoulder ER to 60 degrees Goal status: ongoing  5.  Report no difficulty doing hair or dressing Goal status: partially met    PLAN: PT FREQUENCY: 1-2x/week  PT DURATION: 12 weeks  PLANNED INTERVENTIONS: Therapeutic exercises, Therapeutic activity, Neuromuscular re-education, Balance training, Gait training, Patient/Family education, Joint manipulation, Joint mobilization, Dry Needling, Electrical stimulation, Cryotherapy, Moist heat, Taping, Ultrasound, and Manual therapy  PLAN FOR NEXT SESSION: continue to try to gain as much function as we can Sumner Boast, PT 07/07/2022, 8:47 AM

## 2022-07-10 ENCOUNTER — Ambulatory Visit: Payer: Medicare HMO | Admitting: Physical Therapy

## 2022-07-10 ENCOUNTER — Encounter: Payer: Self-pay | Admitting: Physical Therapy

## 2022-07-10 DIAGNOSIS — M25512 Pain in left shoulder: Secondary | ICD-10-CM

## 2022-07-10 DIAGNOSIS — M25612 Stiffness of left shoulder, not elsewhere classified: Secondary | ICD-10-CM

## 2022-07-10 NOTE — Therapy (Signed)
OUTPATIENT PHYSICAL THERAPY SHOULDER TREATMENT    Patient Name: Leslie Griffin MRN: [id number] DOB:Mar 21, 1951, 71 y.o., female Today's Date: 07/10/2022   PT End of Session - 07/10/22 0840     Visit Number 11    Date for PT Re-Evaluation 08/13/22    PT Start Time 0845    PT Stop Time 0930    PT Time Calculation (min) 45 min    Activity Tolerance Patient tolerated treatment well    Behavior During Therapy [facility] for tasks assessed/performed             Past Medical History:  Diagnosis Date   Allergic rhinitis    Anxiety    Arthritis    Family history of colonic polyps    Fibromyalgia    Heart murmur    History of colonic polyps    Hypercholesterolemia    Hypertension    Mitral valve prolapse    Ocular migraine    Palpitations    Past Surgical History:  Procedure Laterality Date   ABDOMINAL HYSTERECTOMY     age was in her 59s   COLONOSCOPY  10/05/2017   Mild sigmoid diverticulosis. Otherwise normal colonoscopy.    cyst removal arm Left 1981   from cat scratch fever   EYE SURGERY     bilateral cataract removal   FOOT ARTHRODESIS Left 11/01/2021   Procedure: LEFT TALONAVICULAR AND SUBTALAR FUSION;  Surgeon: Newt Minion, MD;  Location: [place];  Service: Orthopedics;  Laterality: Left;   Patient Active Problem List   Diagnosis Date Noted   Primary osteoarthritis of left shoulder 05/13/2022   Prediabetes 05/01/2022   Murmur 04/17/2022   Posterior tibial tendinitis, left leg    HLD (hyperlipidemia) 08/22/2021   Visual snow syndrome 12/30/2020   Arthropathy of lumbar facet joint 11/12/2020   Chest pain 06/14/2020   Dissociative episodes 06/14/2020   Laceration of right index finger without foreign body without damage to nail 04/03/2020   Multiple drug allergies 04/03/2020   B12 deficiency 03/13/2020   Generalized abdominal pain 03/13/2020   Low serum iron 10/03/2019   Low vitamin D level 10/03/2019   Ocular migraine 10/03/2019   Familial hypercholesterolemia  06/27/2019   Upper GI bleed 06/27/2019   Right foot pain 03/27/2019   Generalized pain 10/08/2018   Hypertension 46/27/0350   Diastolic dysfunction, left ventricle 08/18/2018   Family history of macular degeneration 08/18/2018   GERD (gastroesophageal reflux disease) 08/18/2018   History of cataract surgery 08/18/2018   History of migraine 08/18/2018   Hypertensive cardiomyopathy, without heart failure ([place]) 08/18/2018   Medication refill 08/18/2018   Mixed dyslipidemia 08/18/2018   Obesity, Class II, BMI 35-39.9 08/18/2018   Wellness examination 08/18/2018   Cardiomyopathy ([place]) 02/03/2018   Pain and swelling of ankle, right 01/27/2018   Left-sided low back pain without sciatica 12/04/2017   Fatigue 12/04/2017   Anxiety 11/04/2017   Arthralgia 11/04/2017   History of attempted suicide 11/04/2017   Insomnia 11/04/2017   Moderate episode of recurrent major depressive disorder ([place]) 11/04/2017    PCP: Jeralyn Ruths  REFERRING PROVIDER: [name]  REFERRING DIAG: left shoulder pain  THERAPY DIAG:  Acute pain of left shoulder  Stiffness of left shoulder, not elsewhere classified  Rationale for Evaluation and Treatment Rehabilitation  ONSET DATE: 04/29/22  SUBJECTIVE:  SUBJECTIVE STATEMENT: "Good, Trying to get more mobility on my arm, Trying to strengthening it more"   PERTINENT HISTORY: Anxiety, arthritis, fibromyalgia, HTN  PAIN:  Are you having pain? Yes: NPRS scale: 3/10 Pain location: left lateral shoulder left upper arm, c/o some numbness in the fingers Pain description: ache, sharppain >10/10 Aggravating factors: any motions, any movements all ADL's pain >10/10 Relieving factors: reports that nothing has helped the pain, pain at best a 9/10   FALLS:  Has patient fallen in last 6  months? No  LIVING ENVIRONMENT: Lives with: lives with their family Lives in: House/apartment Stairs: No Has following equipment at home: None   PLOF: Independent Does the cooking and cleaning, some gardening  PATIENT GOALS have less pain, do better with ADL's  OBJECTIVE:   DIAGNOSTIC FINDINGS:  Degenerative changes of the cervical spine, OA changes of the left shoulder  PATIENT SURVEYS:  FOTO 16  POSTURE: Fwd head, rounded shoulders  UPPER EXTREMITY ROM: very painful and very limited motions  Active/PROM ROM Left  Eval 05/13/22 Active Left Eval 05/13/22 Passive Left  AROM 06/05/22 L shoulder AROM  06/19/22 L shoulder AROM 07/03/22  Shoulder flexion 60 70 90 140 153  Shoulder extension       Shoulder abduction 30 40 90 130 152  Shoulder adduction       Shoulder internal rotation       Shoulder external rotation       Elbow flexion       Elbow extension       Wrist flexion       Wrist extension       Wrist ulnar deviation       Wrist radial deviation       Wrist pronation       Wrist supination       (Blank rows = not tested)  UPPER EXTREMITY MMT:  all motions very painful, could not do flexion and abduction due to pain  MMT Right eval Left Eval 05/13/22  Shoulder flexion    Shoulder extension    Shoulder abduction    Shoulder adduction    Shoulder internal rotation  4-/5  Shoulder external rotation  3+/5  Middle trapezius    Lower trapezius    Elbow flexion    Elbow extension    Wrist flexion    Wrist extension    Wrist ulnar deviation    Wrist radial deviation    Wrist pronation    Wrist supination    Grip strength (lbs)    (Blank rows = not tested)  SHOULDER SPECIAL TESTS:  Impingement tests: Neer impingement test: positive    Rotator cuff assessment: Drop arm test: positive   Biceps assessment: Yergason's test: positive   PALPATION:  Has a lot of spasms in the left upper traps, sore and tender down the left upper arm   TODAY'S  TREATMENT:  07/10/22 UBE L2 x3 min each Wall slides and wall circles with 1# Shoulder Abd 3x5  Chest press 5# 2x10 Seated rows 20# 2x12 Lats 20# 2x12 Triceps Ext 20lb 2x12 LUE IR yellow 2x10 LUE ER yellow 2x10 very weak  Vaso medium pressure left shoulder   07/07/22 UBE level 2 x 6 minutes Wall slides and wall circles with 1# and without weight, tried some scaption Chest press 5# 2x10 Seated rows 20# 2x10 Lats 20# 2x10 Yellow tband ER very minimal motions Yellow tband small abduction very weak Seated ER at 90 degrees abduction Used UE ranger with  yellow tband all motions with the resistance 3# bar biceps, extension, behind back IR Vaso medium pressure left shoulder   07/03/22 UBE L1.5 x3 min each Seated rows 20lb & Lats 20lb 2x10. Chest press 5lb 2x10  Shoulder Ext 5lb 2x10 Shoulder Flex 2x10 AROM  Shoulder Abd 2x10 Triceps Ext 20lb 2x10 Biceps Curls 2lb 2x10 Wall circles with pillowcase  1lb flexion up wall with pillow case LUE x10 fatigue aster 7 reps Vaso L shoulder low pressure 34 deg x 10 min   07/01/22 UBE 1.5 x 3 min each Seated rows & Lats  15lb 2x10 Shoulder Ext 5lb 2x10 Shoulder Abd 2x10 AROM  Triceps Ext 20lb 2x10 Biceps curls 3lb 2x10 Some LUE PROM  Vaso L shoulder Low 34 deg x10 min    06/26/22 UBE L1 x 3 min each Rows 10lb Lats 15lb 2x10  Shoulder Ext red 2x10 Shoulder Ext 15lb 2x15  Shoulder flex cane 2x5 Shoulder Abd  x10 Vaso L shoulder Low 34 deg x10 min     PATIENT EDUCATION: Education details: HEP Person educated: Patient Education method: Consulting civil engineer, Media planner, Verbal cues, and Handouts Education comprehension: verbalized understanding   HOME EXERCISE PROGRAM: Thigh slides, shrugs and retractions  ASSESSMENT:  CLINICAL IMPRESSION: Patient improving overall, still very weak ER. Motions at or above shoulder height fatigue quick. Pt suspected to have a rotator cuff tear so weakness is expected. Continuing to maximize  function.   OBJECTIVE IMPAIRMENTS decreased activity tolerance, decreased ROM, decreased strength, increased muscle spasms, impaired flexibility, impaired sensation, impaired UE functional use, improper body mechanics, postural dysfunction, and pain.   ACTIVITY LIMITATIONS carrying, lifting, sleeping, bed mobility, bathing, toileting, dressing, self feeding, reach over head, hygiene/grooming, and caring for others  PARTICIPATION LIMITATIONS: meal prep, cleaning, laundry, driving, shopping, community activity, and yard work  [employer] POTENTIAL: Good  CLINICAL DECISION MAKING: Stable/uncomplicated  EVALUATION COMPLEXITY: Low   GOALS: Goals reviewed with patient? Yes  SHORT TERM GOALS: Target date: 05/27/22  Independent with initial HEP Goal status: met  LONG TERM GOALS: Target date: 08/04/22  Understand posture and body mechanics Goal status: met  2.  Decreaes pain 50% Goal status:ongoing  3.  Increase shoulder AROM to 120 degrees flexion Goal status: Met  4.  Increase shoulder ER to 60 degrees Goal status: ongoing  5.  Report no difficulty doing hair or dressing Goal status: partially met    PLAN: PT FREQUENCY: 1-2x/week  PT DURATION: 12 weeks  PLANNED INTERVENTIONS: Therapeutic exercises, Therapeutic activity, Neuromuscular re-education, Balance training, Gait training, Patient/Family education, Joint manipulation, Joint mobilization, Dry Needling, Electrical stimulation, Cryotherapy, Moist heat, Taping, Ultrasound, and Manual therapy  PLAN FOR NEXT SESSION: continue to try to gain as much function as we can Scot Jun, PTA 07/10/2022, 8:41 AM

## 2022-07-14 ENCOUNTER — Telehealth: Payer: Self-pay

## 2022-07-14 NOTE — Telephone Encounter (Signed)
Leslie Griffin is calling requesting a Dexa Scan as she recent had a fracture 06/02/22 of left foot and insurance will cover the Dexa scan preventively every 2 years or 6 mo of a fracture.   Please advise

## 2022-07-15 ENCOUNTER — Other Ambulatory Visit: Payer: Self-pay | Admitting: Nurse Practitioner

## 2022-07-15 DIAGNOSIS — Z13820 Encounter for screening for osteoporosis: Secondary | ICD-10-CM

## 2022-07-15 NOTE — Telephone Encounter (Signed)
I called pt and scheduled the DEXA scan on 07/18/22

## 2022-07-16 NOTE — Telephone Encounter (Signed)
You're welcome!

## 2022-07-17 ENCOUNTER — Ambulatory Visit: Payer: Medicare HMO | Admitting: Physical Therapy

## 2022-07-17 DIAGNOSIS — M25612 Stiffness of left shoulder, not elsewhere classified: Secondary | ICD-10-CM | POA: Diagnosis not present

## 2022-07-17 DIAGNOSIS — M25512 Pain in left shoulder: Secondary | ICD-10-CM | POA: Diagnosis not present

## 2022-07-17 NOTE — Therapy (Signed)
OUTPATIENT PHYSICAL THERAPY SHOULDER TREATMENT    Patient Name: Leslie Griffin MRN: [id number] DOB:10/24/1951, 71 y.o., female Today's Date: 07/17/2022   PT End of Session - 07/17/22 1533     Visit Number 12    Date for PT Re-Evaluation 08/13/22    Authorization Type [employer]    PT Start Time 1530    PT Stop Time 1615    PT Time Calculation (min) 45 min             Past Medical History:  Diagnosis Date   Allergic rhinitis    Anxiety    Arthritis    Family history of colonic polyps    Fibromyalgia    Heart murmur    History of colonic polyps    Hypercholesterolemia    Hypertension    Mitral valve prolapse    Ocular migraine    Palpitations    Past Surgical History:  Procedure Laterality Date   ABDOMINAL HYSTERECTOMY     age was in her 30s   COLONOSCOPY  10/05/2017   Mild sigmoid diverticulosis. Otherwise normal colonoscopy.    cyst removal arm Left 1981   from cat scratch fever   EYE SURGERY     bilateral cataract removal   FOOT ARTHRODESIS Left 11/01/2021   Procedure: LEFT TALONAVICULAR AND SUBTALAR FUSION;  Surgeon: Duda, Marcus V, MD;  Location: [facility] OR;  Service: Orthopedics;  Laterality: Left;   Patient Active Problem List   Diagnosis Date Noted   Primary osteoarthritis of left shoulder 05/13/2022   Prediabetes 05/01/2022   Murmur 04/17/2022   Posterior tibial tendinitis, left leg    HLD (hyperlipidemia) 08/22/2021   Visual snow syndrome 12/30/2020   Arthropathy of lumbar facet joint 11/12/2020   Chest pain 06/14/2020   Dissociative episodes 06/14/2020   Laceration of right index finger without foreign body without damage to nail 04/03/2020   Multiple drug allergies 04/03/2020   B12 deficiency 03/13/2020   Generalized abdominal pain 03/13/2020   Low serum iron 10/03/2019   Low vitamin D level 10/03/2019   Ocular migraine 10/03/2019   Familial hypercholesterolemia 06/27/2019   Upper GI bleed 06/27/2019   Right foot pain 03/27/2019    Generalized pain 10/08/2018   Hypertension 08/18/2018   Diastolic dysfunction, left ventricle 08/18/2018   Family history of macular degeneration 08/18/2018   GERD (gastroesophageal reflux disease) 08/18/2018   History of cataract surgery 08/18/2018   History of migraine 08/18/2018   Hypertensive cardiomyopathy, without heart failure (HCC) 08/18/2018   Medication refill 08/18/2018   Mixed dyslipidemia 08/18/2018   Obesity, Class II, BMI 35-39.9 08/18/2018   Wellness examination 08/18/2018   Cardiomyopathy (HCC) 02/03/2018   Pain and swelling of ankle, right 01/27/2018   Left-sided low back pain without sciatica 12/04/2017   Fatigue 12/04/2017   Anxiety 11/04/2017   Arthralgia 11/04/2017   History of attempted suicide 11/04/2017   Insomnia 11/04/2017   Moderate episode of recurrent major depressive disorder (HCC) 11/04/2017    PCP: Sarah Gray  REFERRING PROVIDER: Evan Corey  REFERRING DIAG: left shoulder pain  THERAPY DIAG:  Acute pain of left shoulder  Stiffness of left shoulder, not elsewhere classified  Rationale for Evaluation and Treatment Rehabilitation  ONSET DATE: 04/29/22  SUBJECTIVE:                                                                                                                                                                                        SUBJECTIVE STATEMENT: A bit more sore today. Overall doing more and pain less. PT is helping   PERTINENT HISTORY: Anxiety, arthritis, fibromyalgia, HTN  PAIN:  Are you having pain? Yes: NPRS scale: 2-3/10 Pain location: left lateral shoulder left upper arm, c/o some numbness in the fingers Pain description: ache, sharppain >10/10 Aggravating factors: any motions, any movements all ADL's pain >10/10 Relieving factors: reports that nothing has helped the pain, pain at best a 9/10   FALLS:  Has patient fallen in last 6 months? No  LIVING ENVIRONMENT: Lives with: lives with their family Lives in:  House/apartment Stairs: No Has following equipment at home: None   PLOF: Independent Does the cooking and cleaning, some gardening  PATIENT GOALS have less pain, do better with ADL's  OBJECTIVE:   DIAGNOSTIC FINDINGS:  Degenerative changes of the cervical spine, OA changes of the left shoulder  PATIENT SURVEYS:  FOTO 16  POSTURE: Fwd head, rounded shoulders  UPPER EXTREMITY ROM: very painful and very limited motions  Active/PROM ROM Left  Eval 05/13/22 Active Left Eval 05/13/22 Passive Left  AROM 06/05/22 L shoulder AROM  06/19/22 L shoulder AROM 07/03/22  Shoulder flexion 60 70 90 140 153  Shoulder extension       Shoulder abduction 30 40 90 130 152  Shoulder adduction       Shoulder internal rotation       Shoulder external rotation       Elbow flexion       Elbow extension       Wrist flexion       Wrist extension       Wrist ulnar deviation       Wrist radial deviation       Wrist pronation       Wrist supination       (Blank rows = not tested)  UPPER EXTREMITY MMT:  all motions very painful, could not do flexion and abduction due to pain  MMT Right eval Left Eval 05/13/22  Shoulder flexion    Shoulder extension    Shoulder abduction    Shoulder adduction    Shoulder internal rotation  4-/5  Shoulder external rotation  3+/5  Middle trapezius    Lower trapezius    Elbow flexion    Elbow extension    Wrist flexion    Wrist extension    Wrist ulnar deviation    Wrist radial deviation    Wrist pronation    Wrist supination    Grip strength (lbs)    (Blank rows = not tested)  SHOULDER SPECIAL TESTS:  Impingement tests: Neer impingement test: positive    Rotator cuff assessment: Drop arm test: positive   Biceps assessment: Yergason's test: positive   PALPATION:  Has a lot of spasms in the left upper traps, sore and tender down the left upper arm   TODAY'S TREATMENT:   07/17/22  UBE L 3 3 min fwd/3 min back Nustep L 4 push/pul 2 min, horz abd  2 min Educ  on ex in water to do as she just joined [employer] ext 12 x 2 sets 20# Bicep 10# 2 sets 12 wt ball reaching behind and around back 10 x each direction ( red ball) 2# flex limited flex 10 x Chest press 5# 2x10 Seated rows 20# 2x12 Lats 20# 2x12  VASO left shld       07/10/22 UBE L2 x3 min each Wall slides and wall circles with 1# Shoulder Abd 3x5  Chest press 5# 2x10 Seated rows 20# 2x12 Lats 20# 2x12 Triceps Ext 20lb 2x12 LUE IR yellow 2x10 LUE ER yellow 2x10 very weak  Vaso medium pressure left shoulder   07/07/22 UBE level 2 x 6 minutes Wall slides and wall circles with 1# and without weight, tried some scaption Chest press 5# 2x10 Seated rows 20# 2x10 Lats 20# 2x10 Yellow tband ER very minimal motions Yellow tband small abduction very weak Seated ER at 90 degrees abduction Used UE ranger with yellow tband all motions with the resistance 3# bar biceps, extension, behind back IR Vaso medium pressure left shoulder   07/03/22 UBE L1.5 x3 min each Seated rows 20lb & Lats 20lb 2x10. Chest press 5lb 2x10  Shoulder Ext 5lb 2x10 Shoulder Flex 2x10 AROM  Shoulder Abd 2x10 Triceps Ext 20lb 2x10 Biceps Curls 2lb 2x10 Wall circles with pillowcase  1lb flexion up wall with pillow case LUE x10 fatigue aster 7 reps Vaso L shoulder low pressure 34 deg x 10 min   07/01/22 UBE 1.5 x 3 min each Seated rows & Lats  15lb 2x10 Shoulder Ext 5lb 2x10 Shoulder Abd 2x10 AROM  Triceps Ext 20lb 2x10 Biceps curls 3lb 2x10 Some LUE PROM  Vaso L shoulder Low 34 deg x10 min    06/26/22 UBE L1 x 3 min each Rows 10lb Lats 15lb 2x10  Shoulder Ext red 2x10 Shoulder Ext 15lb 2x15  Shoulder flex cane 2x5 Shoulder Abd  x10 Vaso L shoulder Low 34 deg x10 min     PATIENT EDUCATION: Education details: HEP Person educated: Patient Education method: Explanation, Demonstration, Verbal cues, and Handouts Education comprehension: verbalized understanding   HOME  EXERCISE PROGRAM: Thigh slides, shrugs and retractions  ASSESSMENT:  CLINICAL IMPRESSION: Pt feels therapy has helped a lot in increasing func as she can do much more with less pain. Continuing to maximize function.   OBJECTIVE IMPAIRMENTS decreased activity tolerance, decreased ROM, decreased strength, increased muscle spasms, impaired flexibility, impaired sensation, impaired UE functional use, improper body mechanics, postural dysfunction, and pain.   ACTIVITY LIMITATIONS carrying, lifting, sleeping, bed mobility, bathing, toileting, dressing, self feeding, reach over head, hygiene/grooming, and caring for others  PARTICIPATION LIMITATIONS: meal prep, cleaning, laundry, driving, shopping, community activity, and yard work  REHAB POTENTIAL: Good  CLINICAL DECISION MAKING: Stable/uncomplicated  EVALUATION COMPLEXITY: Low   GOALS: Goals reviewed with patient? Yes  SHORT TERM GOALS: Target date: 05/27/22  Independent with initial HEP Goal status: met  LONG TERM GOALS: Target date: 08/04/22  Understand posture and body mechanics Goal status: met  2.  Decreaes pain 50% Goal status:ongoing  3.  Increase shoulder AROM to 120 degrees flexion Goal status: Met  4.  Increase shoulder ER to 60 degrees Goal status: ongoing  5.  Report no difficulty doing hair or dressing Goal status: partially met    PLAN: PT FREQUENCY: 1-2x/week  PT DURATION: 12 weeks  PLANNED INTERVENTIONS: Therapeutic exercises, Therapeutic activity, Neuromuscular re-education, Balance training, Gait training, Patient/Family education, Joint manipulation, Joint mobilization, Dry Needling, Electrical stimulation, Cryotherapy, Moist heat, Taping, Ultrasound, and Manual therapy  PLAN FOR NEXT SESSION: continue to try to gain as much function as we can. GOALS AND ROM checked PAYSEUR,ANGIE, PTA 07/17/2022, 3:34 PM  OUTPATIENT PHYSICAL THERAPY SHOULDER TREATMENT    Patient Name: Leslie Griffin MRN:  [id number] DOB:05/30/1951, 71 y.o., female Today's Date: 07/17/2022   PT End of Session - 07/17/22 1533     Visit Number 12    Date for PT Re-Evaluation   08/13/22    Authorization Type [employer] Medicare    PT Start Time 1530    PT Stop Time 1615    PT Time Calculation (min) 45 min             Past Medical History:  Diagnosis Date   Allergic rhinitis    Anxiety    Arthritis    Family history of colonic polyps    Fibromyalgia    Heart murmur    History of colonic polyps    Hypercholesterolemia    Hypertension    Mitral valve prolapse    Ocular migraine    Palpitations    Past Surgical History:  Procedure Laterality Date   ABDOMINAL HYSTERECTOMY     age was in her 30s   COLONOSCOPY  10/05/2017   Mild sigmoid diverticulosis. Otherwise normal colonoscopy.    cyst removal arm Left 1981   from cat scratch fever   EYE SURGERY     bilateral cataract removal   FOOT ARTHRODESIS Left 11/01/2021   Procedure: LEFT TALONAVICULAR AND SUBTALAR FUSION;  Surgeon: Duda, Marcus V, MD;  Location: [facility] OR;  Service: Orthopedics;  Laterality: Left;   Patient Active Problem List   Diagnosis Date Noted   Primary osteoarthritis of left shoulder 05/13/2022   Prediabetes 05/01/2022   Murmur 04/17/2022   Posterior tibial tendinitis, left leg    HLD (hyperlipidemia) 08/22/2021   Visual snow syndrome 12/30/2020   Arthropathy of lumbar facet joint 11/12/2020   Chest pain 06/14/2020   Dissociative episodes 06/14/2020   Laceration of right index finger without foreign body without damage to nail 04/03/2020   Multiple drug allergies 04/03/2020   B12 deficiency 03/13/2020   Generalized abdominal pain 03/13/2020   Low serum iron 10/03/2019   Low vitamin D level 10/03/2019   Ocular migraine 10/03/2019   Familial hypercholesterolemia 06/27/2019   Upper GI bleed 06/27/2019   Right foot pain 03/27/2019   Generalized pain 10/08/2018   Hypertension 08/18/2018   Diastolic dysfunction, left ventricle  08/18/2018   Family history of macular degeneration 08/18/2018   GERD (gastroesophageal reflux disease) 08/18/2018   History of cataract surgery 08/18/2018   History of migraine 08/18/2018   Hypertensive cardiomyopathy, without heart failure (HCC) 08/18/2018   Medication refill 08/18/2018   Mixed dyslipidemia 08/18/2018   Obesity, Class II, BMI 35-39.9 08/18/2018   Wellness examination 08/18/2018   Cardiomyopathy (HCC) 02/03/2018   Pain and swelling of ankle, right 01/27/2018   Left-sided low back pain without sciatica 12/04/2017   Fatigue 12/04/2017   Anxiety 11/04/2017   Arthralgia 11/04/2017   History of attempted suicide 11/04/2017   Insomnia 11/04/2017   Moderate episode of recurrent major depressive disorder (HCC) 11/04/2017    PCP: Sarah Gray  REFERRING PROVIDER: Evan Corey  REFERRING DIAG: left shoulder pain  THERAPY DIAG:  Acute pain of left shoulder  Stiffness of left shoulder, not elsewhere classified  Rationale for Evaluation and Treatment Rehabilitation  ONSET DATE: 04/29/22  SUBJECTIVE:                                                                                                                                                                                        SUBJECTIVE STATEMENT: "Good, Trying to get more mobility on my arm, Trying to strengthening it more"   PERTINENT HISTORY: Anxiety, arthritis, fibromyalgia, HTN  PAIN:  Are you having pain? Yes: NPRS scale: 3/10 Pain location: left lateral shoulder left upper arm, c/o some numbness in the fingers Pain description: ache, sharppain >10/10 Aggravating factors: any motions, any movements all ADL's pain >10/10 Relieving factors: reports that nothing has helped the pain, pain at best a 9/10   FALLS:  Has patient fallen in last 6 months? No  LIVING ENVIRONMENT: Lives with: lives with their family Lives in: House/apartment Stairs: No Has following equipment at home: None   PLOF: Independent  Does the cooking and cleaning, some gardening  PATIENT GOALS have less pain, do better with ADL's  OBJECTIVE:   DIAGNOSTIC FINDINGS:  Degenerative changes of the cervical spine, OA changes of the left shoulder  PATIENT SURVEYS:  FOTO 16  POSTURE: Fwd head, rounded shoulders  UPPER EXTREMITY ROM: very painful and very limited motions  Active/PROM ROM Left  Eval 05/13/22 Active Left Eval 05/13/22 Passive Left  AROM 06/05/22 L shoulder AROM  06/19/22 L shoulder AROM 07/03/22  Shoulder flexion 60 70 90 140 153  Shoulder extension       Shoulder abduction 30 40 90 130 152  Shoulder adduction       Shoulder internal rotation       Shoulder external rotation       Elbow flexion       Elbow extension       Wrist flexion       Wrist extension       Wrist ulnar deviation       Wrist radial deviation       Wrist pronation       Wrist supination       (Blank rows = not tested)  UPPER EXTREMITY MMT:  all motions very painful, could not do flexion and abduction due to pain  MMT Right eval Left Eval 05/13/22  Shoulder flexion    Shoulder extension    Shoulder abduction    Shoulder adduction    Shoulder internal rotation  4-/5  Shoulder external rotation  3+/5  Middle trapezius    Lower trapezius    Elbow flexion    Elbow extension    Wrist flexion    Wrist extension    Wrist ulnar deviation    Wrist radial deviation    Wrist pronation    Wrist supination    Grip strength (lbs)    (Blank rows = not tested)  SHOULDER SPECIAL TESTS:  Impingement tests: Neer impingement test: positive    Rotator cuff assessment: Drop arm test: positive   Biceps assessment: Yergason's test: positive   PALPATION:  Has a lot of spasms in the left upper traps, sore and tender down the left upper arm   TODAY'S TREATMENT:  07/10/22 UBE L2 x3 min each Wall slides and wall circles with 1# Shoulder Abd 3x5  Chest press 5# 2x10 Seated rows 20# 2x12 Lats 20# 2x12 Triceps Ext 20lb  2x12 LUE IR yellow 2x10 LUE ER yellow 2x10 very weak  Vaso medium pressure left shoulder   07/07/22 UBE level 2 x 6 minutes Wall slides and wall circles with 1# and without weight, tried some scaption Chest press 5# 2x10 Seated rows 20# 2x10 Lats 20# 2x10 Yellow tband ER very minimal motions Yellow tband small abduction very weak Seated ER at 90 degrees abduction Used UE ranger with   yellow tband all motions with the resistance 3# bar biceps, extension, behind back IR Vaso medium pressure left shoulder   07/03/22 UBE L1.5 x3 min each Seated rows 20lb & Lats 20lb 2x10. Chest press 5lb 2x10  Shoulder Ext 5lb 2x10 Shoulder Flex 2x10 AROM  Shoulder Abd 2x10 Triceps Ext 20lb 2x10 Biceps Curls 2lb 2x10 Wall circles with pillowcase  1lb flexion up wall with pillow case LUE x10 fatigue aster 7 reps Vaso L shoulder low pressure 34 deg x 10 min   07/01/22 UBE 1.5 x 3 min each Seated rows & Lats  15lb 2x10 Shoulder Ext 5lb 2x10 Shoulder Abd 2x10 AROM  Triceps Ext 20lb 2x10 Biceps curls 3lb 2x10 Some LUE PROM  Vaso L shoulder Low 34 deg x10 min    06/26/22 UBE L1 x 3 min each Rows 10lb Lats 15lb 2x10  Shoulder Ext red 2x10 Shoulder Ext 15lb 2x15  Shoulder flex cane 2x5 Shoulder Abd  x10 Vaso L shoulder Low 34 deg x10 min     PATIENT EDUCATION: Education details: HEP Person educated: Patient Education method: Explanation, Demonstration, Verbal cues, and Handouts Education comprehension: verbalized understanding   HOME EXERCISE PROGRAM: Thigh slides, shrugs and retractions  ASSESSMENT:  CLINICAL IMPRESSION: Patient improving overall, still very weak ER. Motions at or above shoulder height fatigue quick. Pt suspected to have a rotator cuff tear so weakness is expected. Continuing to maximize function.   OBJECTIVE IMPAIRMENTS decreased activity tolerance, decreased ROM, decreased strength, increased muscle spasms, impaired flexibility, impaired sensation, impaired  UE functional use, improper body mechanics, postural dysfunction, and pain.   ACTIVITY LIMITATIONS carrying, lifting, sleeping, bed mobility, bathing, toileting, dressing, self feeding, reach over head, hygiene/grooming, and caring for others  PARTICIPATION LIMITATIONS: meal prep, cleaning, laundry, driving, shopping, community activity, and yard work  REHAB POTENTIAL: Good  CLINICAL DECISION MAKING: Stable/uncomplicated  EVALUATION COMPLEXITY: Low   GOALS: Goals reviewed with patient? Yes  SHORT TERM GOALS: Target date: 05/27/22  Independent with initial HEP Goal status: met  LONG TERM GOALS: Target date: 08/04/22  Understand posture and body mechanics Goal status: met  2.  Decreaes pain 50% Goal status:ongoing  3.  Increase shoulder AROM to 120 degrees flexion Goal status: Met  4.  Increase shoulder ER to 60 degrees Goal status: ongoing  5.  Report no difficulty doing hair or dressing Goal status: partially met    PLAN: PT FREQUENCY: 1-2x/week  PT DURATION: 12 weeks  PLANNED INTERVENTIONS: Therapeutic exercises, Therapeutic activity, Neuromuscular re-education, Balance training, Gait training, Patient/Family education, Joint manipulation, Joint mobilization, Dry Needling, Electrical stimulation, Cryotherapy, Moist heat, Taping, Ultrasound, and Manual therapy  PLAN FOR NEXT SESSION: continue to try to gain as much function as we can PAYSEUR,ANGIE, PTA 07/17/2022, 3:34 PM [facility] [address] [place], [place], [address] Phone: [phone]   Fax:  [phone]  Patient Details  Name: Leslie Griffin MRN: [id number] Date of Birth: 12/29/1950 Referring Provider:  Gray, Sarah E, NP  Encounter Date: 07/17/2022   PAYSEUR,ANGIE, PTA 07/17/2022, 3:34 PM  [facility] [address] , [place], [address] Phone: [phone]   Fax:  [phone] 

## 2022-07-18 ENCOUNTER — Ambulatory Visit (INDEPENDENT_AMBULATORY_CARE_PROVIDER_SITE_OTHER)
Admission: RE | Admit: 2022-07-18 | Discharge: 2022-07-18 | Disposition: A | Discharge status: Home / Self Care | Payer: Medicare HMO | Source: Ambulatory Visit | Attending: Nurse Practitioner | Admitting: Nurse Practitioner

## 2022-07-18 DIAGNOSIS — Z13820 Encounter for screening for osteoporosis: Secondary | ICD-10-CM

## 2022-07-20 ENCOUNTER — Other Ambulatory Visit: Payer: Self-pay | Admitting: Orthopedic Surgery

## 2022-07-22 ENCOUNTER — Telehealth (HOSPITAL_COMMUNITY): Payer: Self-pay | Admitting: Emergency Medicine

## 2022-07-22 ENCOUNTER — Ambulatory Visit: Payer: Medicare HMO | Attending: Family Medicine | Admitting: Physical Therapy

## 2022-07-22 ENCOUNTER — Encounter: Payer: Self-pay | Admitting: Physical Therapy

## 2022-07-22 DIAGNOSIS — M25612 Stiffness of left shoulder, not elsewhere classified: Secondary | ICD-10-CM | POA: Insufficient documentation

## 2022-07-22 DIAGNOSIS — M25512 Pain in left shoulder: Secondary | ICD-10-CM | POA: Insufficient documentation

## 2022-07-22 NOTE — Therapy (Signed)
OUTPATIENT PHYSICAL THERAPY SHOULDER TREATMENT    Patient Name: Leslie Griffin MRN: [id number] DOB:1951-10-30, 71 y.o., female Today's Date: 07/22/2022   PT End of Session - 07/22/22 0756     Visit Number 13    Date for PT Re-Evaluation 08/13/22    PT Start Time 0756    PT Stop Time 0845    PT Time Calculation (min) 49 min    Activity Tolerance Patient tolerated treatment well    Behavior During Therapy [facility] for tasks assessed/performed             Past Medical History:  Diagnosis Date   Allergic rhinitis    Anxiety    Arthritis    Family history of colonic polyps    Fibromyalgia    Heart murmur    History of colonic polyps    Hypercholesterolemia    Hypertension    Mitral valve prolapse    Ocular migraine    Palpitations    Past Surgical History:  Procedure Laterality Date   ABDOMINAL HYSTERECTOMY     age was in her 46s   COLONOSCOPY  10/05/2017   Mild sigmoid diverticulosis. Otherwise normal colonoscopy.    cyst removal arm Left 1981   from cat scratch fever   EYE SURGERY     bilateral cataract removal   FOOT ARTHRODESIS Left 11/01/2021   Procedure: LEFT TALONAVICULAR AND SUBTALAR FUSION;  Surgeon: Newt Minion, MD;  Location: [facility];  Service: Orthopedics;  Laterality: Left;   Patient Active Problem List   Diagnosis Date Noted   Primary osteoarthritis of left shoulder 05/13/2022   Prediabetes 05/01/2022   Murmur 04/17/2022   Posterior tibial tendinitis, left leg    HLD (hyperlipidemia) 08/22/2021   Visual snow syndrome 12/30/2020   Arthropathy of lumbar facet joint 11/12/2020   Chest pain 06/14/2020   Dissociative episodes 06/14/2020   Laceration of right index finger without foreign body without damage to nail 04/03/2020   Multiple drug allergies 04/03/2020   B12 deficiency 03/13/2020   Generalized abdominal pain 03/13/2020   Low serum iron 10/03/2019   Low vitamin D level 10/03/2019   Ocular migraine 10/03/2019   Familial hypercholesterolemia  06/27/2019   Upper GI bleed 06/27/2019   Right foot pain 03/27/2019   Generalized pain 10/08/2018   Hypertension 40/97/3532   Diastolic dysfunction, left ventricle 08/18/2018   Family history of macular degeneration 08/18/2018   GERD (gastroesophageal reflux disease) 08/18/2018   History of cataract surgery 08/18/2018   History of migraine 08/18/2018   Hypertensive cardiomyopathy, without heart failure ([place]) 08/18/2018   Medication refill 08/18/2018   Mixed dyslipidemia 08/18/2018   Obesity, Class II, BMI 35-39.9 08/18/2018   Wellness examination 08/18/2018   Cardiomyopathy ([place]) 02/03/2018   Pain and swelling of ankle, right 01/27/2018   Left-sided low back pain without sciatica 12/04/2017   Fatigue 12/04/2017   Anxiety 11/04/2017   Arthralgia 11/04/2017   History of attempted suicide 11/04/2017   Insomnia 11/04/2017   Moderate episode of recurrent major depressive disorder ([place]) 11/04/2017    PCP: Jeralyn Ruths  REFERRING PROVIDER: [name]  REFERRING DIAG: left shoulder pain  THERAPY DIAG:  Acute pain of left shoulder  Stiffness of left shoulder, not elsewhere classified  Rationale for Evaluation and Treatment Rehabilitation  ONSET DATE: 04/29/22  SUBJECTIVE:  SUBJECTIVE STATEMENT: "Maybe some improvement"   PERTINENT HISTORY: Anxiety, arthritis, fibromyalgia, HTN  PAIN:  Are you having pain? Yes: NPRS scale: 2/10 Pain location: left lateral shoulder left upper arm, c/o some numbness in the fingers Pain description: ache, sharppain >10/10 Aggravating factors: any motions, any movements all ADL's pain >10/10 Relieving factors: reports that nothing has helped the pain, pain at best a 9/10   FALLS:  Has patient fallen in last 6 months? No  LIVING ENVIRONMENT: Lives with: lives  with their family Lives in: House/apartment Stairs: No Has following equipment at home: None   PLOF: Independent Does the cooking and cleaning, some gardening  PATIENT GOALS have less pain, do better with ADL's  OBJECTIVE:   DIAGNOSTIC FINDINGS:  Degenerative changes of the cervical spine, OA changes of the left shoulder  PATIENT SURVEYS:  FOTO 16  POSTURE: Fwd head, rounded shoulders  UPPER EXTREMITY ROM: very painful and very limited motions  Active/PROM ROM Left  Eval 05/13/22 Active Left Eval 05/13/22 Passive Left  AROM 06/05/22 L shoulder AROM  06/19/22 L shoulder AROM 07/03/22  Shoulder flexion 60 70 90 140 153  Shoulder extension       Shoulder abduction 30 40 90 130 152  Shoulder adduction       Shoulder internal rotation       Shoulder external rotation       Elbow flexion       Elbow extension       Wrist flexion       Wrist extension       Wrist ulnar deviation       Wrist radial deviation       Wrist pronation       Wrist supination       (Blank rows = not tested)  UPPER EXTREMITY MMT:  all motions very painful, could not do flexion and abduction due to pain  MMT Right eval Left Eval 05/13/22  Shoulder flexion    Shoulder extension    Shoulder abduction    Shoulder adduction    Shoulder internal rotation  4-/5  Shoulder external rotation  3+/5  Middle trapezius    Lower trapezius    Elbow flexion    Elbow extension    Wrist flexion    Wrist extension    Wrist ulnar deviation    Wrist radial deviation    Wrist pronation    Wrist supination    Grip strength (lbs)    (Blank rows = not tested)  SHOULDER SPECIAL TESTS:  Impingement tests: Neer impingement test: positive    Rotator cuff assessment: Drop arm test: positive   Biceps assessment: Yergason's test: positive   PALPATION:  Has a lot of spasms in the left upper traps, sore and tender down the left upper arm   TODAY'S TREATMENT:   07/22/2022 UBE L1.5 x 3 min each Shoulder flex  1lb 2x10 limited ROM as she fatigue  Shoulde abd AROM 2x10 LUE AAROM from therapist ER 2x10 Rows 20lb 2x10 Lats 20lb 2x10 Chest press 5lb 2x15 3lb flex bilat 3lb dumbbell 2x10 Biceps curls 3lb 2x10 Shoulder Ext 5lb 2x1o Triceps Ext 20lb 2x15 LUE IR red 2x15 VASO L shoulder low 34 deg 10 min  07/17/22  UBE L 3 3 min fwd/3 min back Nustep L 4 push/pul 2 min, horz abd 2 min Educ  on ex in water to do as she just joined [employer] ext 12 x 2 sets 20# Bicep 10# 2 sets 12  wt ball reaching behind and around back 10 x each direction ( red ball) 2# flex limited flex 10 x Chest press 5# 2x10 Seated rows 20# 2x12 Lats 20# 2x12  VASO left shld   07/10/22 UBE L2 x3 min each Wall slides and wall circles with 1# Shoulder Abd 3x5  Chest press 5# 2x10 Seated rows 20# 2x12 Lats 20# 2x12 Triceps Ext 20lb 2x12 LUE IR yellow 2x10 LUE ER yellow 2x10 very weak  Vaso medium pressure left shoulder   07/07/22 UBE level 2 x 6 minutes Wall slides and wall circles with 1# and without weight, tried some scaption Chest press 5# 2x10 Seated rows 20# 2x10 Lats 20# 2x10 Yellow tband ER very minimal motions Yellow tband small abduction very weak Seated ER at 90 degrees abduction Used UE ranger with yellow tband all motions with the resistance 3# bar biceps, extension, behind back IR Vaso medium pressure left shoulder   07/03/22 UBE L1.5 x3 min each Seated rows 20lb & Lats 20lb 2x10. Chest press 5lb 2x10  Shoulder Ext 5lb 2x10 Shoulder Flex 2x10 AROM  Shoulder Abd 2x10 Triceps Ext 20lb 2x10 Biceps Curls 2lb 2x10 Wall circles with pillowcase  1lb flexion up wall with pillow case LUE x10 fatigue aster 7 reps Vaso L shoulder low pressure 34 deg x 10 min   07/01/22 UBE 1.5 x 3 min each Seated rows & Lats  15lb 2x10 Shoulder Ext 5lb 2x10 Shoulder Abd 2x10 AROM  Triceps Ext 20lb 2x10 Biceps curls 3lb 2x10 Some LUE PROM  Vaso L shoulder Low 34 deg x10 min    06/26/22 UBE L1 x 3 min  each Rows 10lb Lats 15lb 2x10  Shoulder Ext red 2x10 Shoulder Ext 15lb 2x15  Shoulder flex cane 2x5 Shoulder Abd  x10 Vaso L shoulder Low 34 deg x10 min     PATIENT EDUCATION: Education details: HEP Person educated: Patient Education method: Consulting civil engineer, Media planner, Verbal cues, and Handouts Education comprehension: verbalized understanding   HOME EXERCISE PROGRAM: Thigh slides, shrugs and retractions  ASSESSMENT:  CLINICAL IMPRESSION: Continue to maximize function through strengthening the L shoulder. Pt has L shoulder does fatigue quick with flexion and abduction only completing a few reps before needing to rest during sets. LUE is very weak with ER requiring assist to complete reps.   OBJECTIVE IMPAIRMENTS decreased activity tolerance, decreased ROM, decreased strength, increased muscle spasms, impaired flexibility, impaired sensation, impaired UE functional use, improper body mechanics, postural dysfunction, and pain.   ACTIVITY LIMITATIONS carrying, lifting, sleeping, bed mobility, bathing, toileting, dressing, self feeding, reach over head, hygiene/grooming, and caring for others  PARTICIPATION LIMITATIONS: meal prep, cleaning, laundry, driving, shopping, community activity, and yard work  [employer] POTENTIAL: Good  CLINICAL DECISION MAKING: Stable/uncomplicated  EVALUATION COMPLEXITY: Low   GOALS: Goals reviewed with patient? Yes  SHORT TERM GOALS: Target date: 05/27/22  Independent with initial HEP Goal status: met  LONG TERM GOALS: Target date: 08/04/22  Understand posture and body mechanics Goal status: met  2.  Decreaes pain 50% Goal status:ongoing  3.  Increase shoulder AROM to 120 degrees flexion Goal status: Met  4.  Increase shoulder ER to 60 degrees Goal status: ongoing  5.  Report no difficulty doing hair or dressing Goal status: partially met    PLAN: PT FREQUENCY: 1-2x/week  PT DURATION: 12 weeks  PLANNED INTERVENTIONS:  Therapeutic exercises, Therapeutic activity, Neuromuscular re-education, Balance training, Gait training, Patient/Family education, Joint manipulation, Joint mobilization, Dry Needling, Electrical stimulation, Cryotherapy, Moist heat, Taping, Ultrasound,  and Manual therapy  PLAN FOR NEXT SESSION: continue to try to gain as much function as we can. Scot Jun, PTA

## 2022-07-22 NOTE — Telephone Encounter (Signed)
Reaching out to patient to offer assistance regarding upcoming cardiac imaging study; pt verbalizes understanding of appt date/time, parking situation and where to check in, pre-test NPO status and medications ordered, and verified current allergies; name and call back number provided for further questions should they arise Leslie Bond RN [facility] [phone] office [phone] cell   Denies clautro Denies metal Denies iv issues Arrival 730 w/c entrance

## 2022-07-23 ENCOUNTER — Other Ambulatory Visit: Payer: Self-pay | Admitting: Cardiology

## 2022-07-23 ENCOUNTER — Ambulatory Visit (HOSPITAL_COMMUNITY)
Admission: RE | Admit: 2022-07-23 | Discharge: 2022-07-23 | Disposition: A | Discharge status: Home / Self Care | Payer: Medicare HMO | Source: Ambulatory Visit | Attending: Cardiology | Admitting: Cardiology

## 2022-07-23 DIAGNOSIS — I517 Cardiomegaly: Secondary | ICD-10-CM | POA: Diagnosis not present

## 2022-07-23 MED ORDER — GADOBUTROL 1 MMOL/ML IV SOLN
8.0000 mL | Freq: Once | INTRAVENOUS | Status: AC | PRN
  Administered 2022-07-23: 8 mL via INTRAVENOUS

## 2022-07-24 ENCOUNTER — Encounter: Payer: Self-pay | Admitting: Physical Therapy

## 2022-07-24 ENCOUNTER — Ambulatory Visit: Payer: Medicare HMO | Admitting: Physical Therapy

## 2022-07-24 ENCOUNTER — Other Ambulatory Visit: Payer: Self-pay | Admitting: Orthopedic Surgery

## 2022-07-24 DIAGNOSIS — M25612 Stiffness of left shoulder, not elsewhere classified: Secondary | ICD-10-CM | POA: Diagnosis not present

## 2022-07-24 DIAGNOSIS — M25512 Pain in left shoulder: Secondary | ICD-10-CM

## 2022-07-24 NOTE — Therapy (Signed)
OUTPATIENT PHYSICAL THERAPY SHOULDER TREATMENT    Patient Name: Leslie Griffin MRN: [id number] DOB:04-15-1951, 71 y.o., female Today's Date: 07/24/2022   PT End of Session - 07/24/22 1015     Visit Number 14    Date for PT Re-Evaluation 08/13/22    PT Start Time 1015    PT Stop Time 1105    PT Time Calculation (min) 50 min    Activity Tolerance Patient tolerated treatment well    Behavior During Therapy [facility] for tasks assessed/performed             Past Medical History:  Diagnosis Date   Allergic rhinitis    Anxiety    Arthritis    Family history of colonic polyps    Fibromyalgia    Heart murmur    History of colonic polyps    Hypercholesterolemia    Hypertension    Mitral valve prolapse    Ocular migraine    Palpitations    Past Surgical History:  Procedure Laterality Date   ABDOMINAL HYSTERECTOMY     age was in her 53s   COLONOSCOPY  10/05/2017   Mild sigmoid diverticulosis. Otherwise normal colonoscopy.    cyst removal arm Left 1981   from cat scratch fever   EYE SURGERY     bilateral cataract removal   FOOT ARTHRODESIS Left 11/01/2021   Procedure: LEFT TALONAVICULAR AND SUBTALAR FUSION;  Surgeon: Newt Minion, MD;  Location: [facility];  Service: Orthopedics;  Laterality: Left;   Patient Active Problem List   Diagnosis Date Noted   Primary osteoarthritis of left shoulder 05/13/2022   Prediabetes 05/01/2022   Murmur 04/17/2022   Posterior tibial tendinitis, left leg    HLD (hyperlipidemia) 08/22/2021   Visual snow syndrome 12/30/2020   Arthropathy of lumbar facet joint 11/12/2020   Chest pain 06/14/2020   Dissociative episodes 06/14/2020   Laceration of right index finger without foreign body without damage to nail 04/03/2020   Multiple drug allergies 04/03/2020   B12 deficiency 03/13/2020   Generalized abdominal pain 03/13/2020   Low serum iron 10/03/2019   Low vitamin D level 10/03/2019   Ocular migraine 10/03/2019   Familial hypercholesterolemia  06/27/2019   Upper GI bleed 06/27/2019   Right foot pain 03/27/2019   Generalized pain 10/08/2018   Hypertension 70/92/9574   Diastolic dysfunction, left ventricle 08/18/2018   Family history of macular degeneration 08/18/2018   GERD (gastroesophageal reflux disease) 08/18/2018   History of cataract surgery 08/18/2018   History of migraine 08/18/2018   Hypertensive cardiomyopathy, without heart failure ([place]) 08/18/2018   Medication refill 08/18/2018   Mixed dyslipidemia 08/18/2018   Obesity, Class II, BMI 35-39.9 08/18/2018   Wellness examination 08/18/2018   Cardiomyopathy ([place]) 02/03/2018   Pain and swelling of ankle, right 01/27/2018   Left-sided low back pain without sciatica 12/04/2017   Fatigue 12/04/2017   Anxiety 11/04/2017   Arthralgia 11/04/2017   History of attempted suicide 11/04/2017   Insomnia 11/04/2017   Moderate episode of recurrent major depressive disorder ([place]) 11/04/2017    PCP: Jeralyn Ruths  REFERRING PROVIDER: [name]  REFERRING DIAG: left shoulder pain  THERAPY DIAG:  Acute pain of left shoulder  Stiffness of left shoulder, not elsewhere classified  Rationale for Evaluation and Treatment Rehabilitation  ONSET DATE: 04/29/22  SUBJECTIVE:  SUBJECTIVE STATEMENT: Went to th gym yesterday, Good today   PERTINENT HISTORY: Anxiety, arthritis, fibromyalgia, HTN  PAIN:  Are you having pain? Yes: NPRS scale: 2/10 Pain location: left lateral shoulder left upper arm, c/o some numbness in the fingers Pain description: ache, sharppain >10/10 Aggravating factors: any motions, any movements all ADL's pain >10/10 Relieving factors: reports that nothing has helped the pain, pain at best a 9/10   FALLS:  Has patient fallen in last 6 months? No  LIVING ENVIRONMENT: Lives  with: lives with their family Lives in: House/apartment Stairs: No Has following equipment at home: None   PLOF: Independent Does the cooking and cleaning, some gardening  PATIENT GOALS have less pain, do better with ADL's  OBJECTIVE:   DIAGNOSTIC FINDINGS:  Degenerative changes of the cervical spine, OA changes of the left shoulder  PATIENT SURVEYS:  FOTO 16  POSTURE: Fwd head, rounded shoulders  UPPER EXTREMITY ROM: very painful and very limited motions  Active/PROM ROM Left  Eval 05/13/22 Active Left Eval 05/13/22 Passive Left  AROM 06/05/22 L shoulder AROM  06/19/22 L shoulder AROM 07/03/22  Shoulder flexion 60 70 90 140 153  Shoulder extension       Shoulder abduction 30 40 90 130 152  Shoulder adduction       Shoulder internal rotation       Shoulder external rotation       Elbow flexion       Elbow extension       Wrist flexion       Wrist extension       Wrist ulnar deviation       Wrist radial deviation       Wrist pronation       Wrist supination       (Blank rows = not tested)  UPPER EXTREMITY MMT:  all motions very painful, could not do flexion and abduction due to pain  MMT Right eval Left Eval 05/13/22  Shoulder flexion    Shoulder extension    Shoulder abduction    Shoulder adduction    Shoulder internal rotation  4-/5  Shoulder external rotation  3+/5  Middle trapezius    Lower trapezius    Elbow flexion    Elbow extension    Wrist flexion    Wrist extension    Wrist ulnar deviation    Wrist radial deviation    Wrist pronation    Wrist supination    Grip strength (lbs)    (Blank rows = not tested)  SHOULDER SPECIAL TESTS:  Impingement tests: Neer impingement test: positive    Rotator cuff assessment: Drop arm test: positive   Biceps assessment: Yergason's test: positive   PALPATION:  Has a lot of spasms in the left upper traps, sore and tender down the left upper arm   TODAY'S TREATMENT:  07/24/22 UBE L2x 3 min each Shoulder  flex 1lb 2x10 limited ROM as she fatigue, cues not to use momentum  Shoulder Abd 1lb 2x10  Shoulder Ext 10lb 2x10  Standing rows 10lb 2x12 Triceps Ext 25lb 2x12 Chest press 5lb 2x10 Rows 20lb 2x10 Lat 20lb 2x10  VASO L shoulder 34deg x10 min    07/22/2022 UBE L1.5 x 3 min each Shoulder flex 1lb 2x10 limited ROM as she fatigue  Shoulde abd AROM 2x10 LUE AAROM from therapist ER 2x10 Rows 20lb 2x10 Lats 20lb 2x10 Chest press 5lb 2x15 3lb flex bilat 3lb dumbbell 2x10 Biceps curls 3lb 2x10 Shoulder Ext 5lb 2x1o  Triceps Ext 20lb 2x15 LUE IR red 2x15 VASO L shoulder low 34 deg 10 min  07/17/22  UBE L 3 3 min fwd/3 min back Nustep L 4 push/pul 2 min, horz abd 2 min Educ  on ex in water to do as she just joined [employer] ext 12 x 2 sets 20# Bicep 10# 2 sets 12 wt ball reaching behind and around back 10 x each direction ( red ball) 2# flex limited flex 10 x Chest press 5# 2x10 Seated rows 20# 2x12 Lats 20# 2x12  VASO left shld   07/10/22 UBE L2 x3 min each Wall slides and wall circles with 1# Shoulder Abd 3x5  Chest press 5# 2x10 Seated rows 20# 2x12 Lats 20# 2x12 Triceps Ext 20lb 2x12 LUE IR yellow 2x10 LUE ER yellow 2x10 very weak  Vaso medium pressure left shoulder   07/07/22 UBE level 2 x 6 minutes Wall slides and wall circles with 1# and without weight, tried some scaption Chest press 5# 2x10 Seated rows 20# 2x10 Lats 20# 2x10 Yellow tband ER very minimal motions Yellow tband small abduction very weak Seated ER at 90 degrees abduction Used UE ranger with yellow tband all motions with the resistance 3# bar biceps, extension, behind back IR Vaso medium pressure left shoulder   07/03/22 UBE L1.5 x3 min each Seated rows 20lb & Lats 20lb 2x10. Chest press 5lb 2x10  Shoulder Ext 5lb 2x10 Shoulder Flex 2x10 AROM  Shoulder Abd 2x10 Triceps Ext 20lb 2x10 Biceps Curls 2lb 2x10 Wall circles with pillowcase  1lb flexion up wall with pillow case LUE x10 fatigue  aster 7 reps Vaso L shoulder low pressure 34 deg x 10 min   07/01/22 UBE 1.5 x 3 min each Seated rows & Lats  15lb 2x10 Shoulder Ext 5lb 2x10 Shoulder Abd 2x10 AROM  Triceps Ext 20lb 2x10 Biceps curls 3lb 2x10 Some LUE PROM  Vaso L shoulder Low 34 deg x10 min    06/26/22 UBE L1 x 3 min each Rows 10lb Lats 15lb 2x10  Shoulder Ext red 2x10 Shoulder Ext 15lb 2x15  Shoulder flex cane 2x5 Shoulder Abd  x10 Vaso L shoulder Low 34 deg x10 min     PATIENT EDUCATION: Education details: HEP Person educated: Patient Education method: Consulting civil engineer, Media planner, Verbal cues, and Handouts Education comprehension: verbalized understanding   HOME EXERCISE PROGRAM: Thigh slides, shrugs and retractions  ASSESSMENT:  CLINICAL IMPRESSION: Continue to maximize function through strengthening the L shoulder. Pt has L shoulder does fatigue quick with flexion and abduction only completing a few reps before needing to rest during sets. LUE is very weak with ER requiring assist to complete reps. Increase resistance tolerated with shoulder and triceps Ext. Good effort given throughout session.   OBJECTIVE IMPAIRMENTS decreased activity tolerance, decreased ROM, decreased strength, increased muscle spasms, impaired flexibility, impaired sensation, impaired UE functional use, improper body mechanics, postural dysfunction, and pain.   ACTIVITY LIMITATIONS carrying, lifting, sleeping, bed mobility, bathing, toileting, dressing, self feeding, reach over head, hygiene/grooming, and caring for others  PARTICIPATION LIMITATIONS: meal prep, cleaning, laundry, driving, shopping, community activity, and yard work  [employer] POTENTIAL: Good  CLINICAL DECISION MAKING: Stable/uncomplicated  EVALUATION COMPLEXITY: Low   GOALS: Goals reviewed with patient? Yes  SHORT TERM GOALS: Target date: 05/27/22  Independent with initial HEP Goal status: met  LONG TERM GOALS: Target date: 08/04/22  Understand  posture and body mechanics Goal status: met  2.  Decreaes pain 50% Goal status:ongoing  3.  Increase  shoulder AROM to 120 degrees flexion Goal status: Met  4.  Increase shoulder ER to 60 degrees Goal status: ongoing  5.  Report no difficulty doing hair or dressing Goal status: partially met    PLAN: PT FREQUENCY: 1-2x/week  PT DURATION: 12 weeks  PLANNED INTERVENTIONS: Therapeutic exercises, Therapeutic activity, Neuromuscular re-education, Balance training, Gait training, Patient/Family education, Joint manipulation, Joint mobilization, Dry Needling, Electrical stimulation, Cryotherapy, Moist heat, Taping, Ultrasound, and Manual therapy  PLAN FOR NEXT SESSION: continue to try to gain as much function as we can. Scot Jun, PTA

## 2022-07-29 ENCOUNTER — Ambulatory Visit: Payer: Medicare HMO | Attending: Cardiology | Admitting: Cardiology

## 2022-07-29 ENCOUNTER — Ambulatory Visit: Payer: Medicare HMO | Admitting: Physical Therapy

## 2022-07-29 ENCOUNTER — Encounter: Payer: Self-pay | Admitting: Cardiology

## 2022-07-29 VITALS — BP 138/90 | HR 89 | Ht 61.0 in | Wt 184.0 lb

## 2022-07-29 DIAGNOSIS — E785 Hyperlipidemia, unspecified: Secondary | ICD-10-CM

## 2022-07-29 DIAGNOSIS — R002 Palpitations: Secondary | ICD-10-CM

## 2022-07-29 DIAGNOSIS — I10 Essential (primary) hypertension: Secondary | ICD-10-CM | POA: Diagnosis not present

## 2022-07-29 DIAGNOSIS — R079 Chest pain, unspecified: Secondary | ICD-10-CM

## 2022-07-29 DIAGNOSIS — I517 Cardiomegaly: Secondary | ICD-10-CM

## 2022-07-29 MED ORDER — CARVEDILOL 6.25 MG PO TABS: 6.2500 mg | ORAL_TABLET | Freq: Two times a day (BID) | ORAL | 3 refills | Status: DC

## 2022-07-29 NOTE — Patient Instructions (Signed)
Medication Instructions:  STOP spironolactone START carvedilol (Coreg) 6.25 mg two times daily  Please check your blood pressure at home daily, write it down.  Bring log and blood pressure cuff to appointment with pharmacist.   *If you need a refill on your cardiac medications before your next appointment, please call your pharmacy*   Lab Work: Lipid today  If you have labs (blood work) drawn today and your tests are completely normal, you will receive your results only by: [place] (if you have MyChart) OR A paper copy in the mail If you have any lab test that is abnormal or we need to change your treatment, we will call you to review the results.   Follow-Up: At [facility], you and your health needs are our priority.  As part of our continuing mission to provide you with exceptional heart care, we have created designated Provider Care Teams.  These Care Teams include your primary Cardiologist (physician) and Advanced Practice Providers (APPs -  Physician Assistants and Nurse Practitioners) who all work together to provide you with the care you need, when you need it.  We recommend signing up for the patient portal called "MyChart".  Sign up information is provided on this After Visit Summary.  MyChart is used to connect with patients for Virtual Visits (Telemedicine).  Patients are able to view lab/test results, encounter notes, upcoming appointments, etc.  Non-urgent messages can be sent to your provider as well.   To learn more about what you can do with MyChart, go to [email or web address].    Your next appointment:   2 weeks with pharmacist --bring blood pressure log and blood pressure cuff to appointment  6 months with Dr. Gardiner Rhyme  Other Instructions Recommend your son be screened for hypertrophic cardiomyopathy

## 2022-07-29 NOTE — Progress Notes (Signed)
Cardiology Office Note:    Date:  07/31/2022   ID:  Leslie Griffin, DOB Sep 07, 1951, MRN [id number]  PCP:  Ailene Ards, NP  Cardiologist:  None  Electrophysiologist:  None   Referring MD: Ailene Ards, NP   Chief Complaint  Patient presents with   Chest Pain    History of Present Illness:    Leslie Griffin is a 71 y.o. female with a hx of hypertension, hyperlipidemia, fibromyalgia, MVP who presents for follow-up.  She was referred by Jeralyn Ruths, NP for evaluation of chest pain, initially seen on 04/28/2022.  She was seen in the ED for chest pain on 04/11/2022, work-up unremarkable.  She reports she has been having chest pain off and on since 5/26.  Feels sharp pain in the left center chest, lasts for about 30 minutes or so.  Occurs within 5 minutes of eating.  However has also noted chest pain when exerting herself.  Does report having shortness of breath.  Has also had episodes of lightheadedness but no syncope.  Reports has been having palpitations occurring during episodes as well.  No smoking history.  Family history includes both parents died from MI around age 66, brother had CABG in 41s.  Echocardiogram 05/05/2022 showed severe asymmetric hypertrophy of the septum, peak gradient across LVOT with Valsalva 20 mmHg, normal biventricular function, mild AI/MR.  Zio patch x7 days on 05/15/2022 showed no significant arrhythmias.  Coronary CTA 05/21/2022 showed calcium score 1 (40th percentile), minimal nonobstructive plaque in LAD.  Cardiac MRI 07/23/2022 showed asymmetric LV hypertrophy measuring 16 mm in basal septum (10 mm in posterior wall), meeting criteria for hypertrophic dermopathy, no LGE, normal biventricular size and systolic function, mild mitral regurgitation (regurgitant fraction 11%).  Since last clinic visit, she reports she is doing okay.  Continues to have intermittent chest pain.  Reports has feelings like her heart is pounding.  Continues to have shortness of breath.   Past Medical  History:  Diagnosis Date   Allergic rhinitis    Anxiety    Arthritis    Family history of colonic polyps    Fibromyalgia    Heart murmur    History of colonic polyps    Hypercholesterolemia    Hypertension    Mitral valve prolapse    Ocular migraine    Palpitations     Past Surgical History:  Procedure Laterality Date   ABDOMINAL HYSTERECTOMY     age was in her 73s   COLONOSCOPY  10/05/2017   Mild sigmoid diverticulosis. Otherwise normal colonoscopy.    cyst removal arm Left 1981   from cat scratch fever   EYE SURGERY     bilateral cataract removal   FOOT ARTHRODESIS Left 11/01/2021   Procedure: LEFT TALONAVICULAR AND SUBTALAR FUSION;  Surgeon: Newt Minion, MD;  Location: [facility];  Service: Orthopedics;  Laterality: Left;    Current Medications: Current Meds  Medication Sig   carvedilol (COREG) 6.25 MG tablet Take 1 tablet (6.25 mg total) by mouth 2 (two) times daily.   cholecalciferol (VITAMIN D) 25 MCG (1000 UNIT) tablet Take 1,000 Units by mouth daily.   cyanocobalamin 1000 MCG tablet Take 1 tablet by mouth daily.   ferrous sulfate 325 (65 FE) MG tablet Take 1 tablet by mouth daily.   Omega-3 Fatty Acids (FISH OIL) 1200 MG CAPS Take 2,400 mg by mouth daily.   omeprazole (PRILOSEC OTC) 20 MG tablet Take 1 tablet (20 mg total) by mouth daily.   pregabalin (LYRICA)  75 MG capsule Take 1 capsule (75 mg total) by mouth 2 (two) times daily as needed.   rosuvastatin (CRESTOR) 10 MG tablet Take 1 tablet (10 mg total) by mouth daily.   [DISCONTINUED] spironolactone (ALDACTONE) 25 MG tablet Take 1 tablet (25 mg total) by mouth daily.     Allergies:   Benadryl [diphenhydramine hcl], Cephalexin, Duloxetine, Penicillins, Topiramate, and Sulfa antibiotics   Social History   Socioeconomic History   Marital status: Married    Spouse name: Not on file   Number of children: 1   Years of education: Not on file   Highest education level: Not on file  Occupational History   Not  on file  Tobacco Use   Smoking status: Never   Smokeless tobacco: Never  Vaping Use   Vaping Use: Never used  Substance and Sexual Activity   Alcohol use: Yes    Comment: ocassionally    Drug use: No   Sexual activity: Not on file  Other Topics Concern   Not on file  Social History Narrative   Not on file   Social Determinants of Health   Financial Resource Strain: Not on file  Food Insecurity: Not on file  Transportation Needs: Not on file  Physical Activity: Not on file  Stress: Not on file  Social Connections: Not on file     Family History: The patient's family history includes Colon polyps in her sister; Coronary artery disease in an other family member; Heart disease in her father and mother; Other in her sister. There is no history of Colon cancer, Esophageal cancer, Rectal cancer, or Stomach cancer.  ROS:   Please see the history of present illness.     All other systems reviewed and are negative.  EKGs/Labs/Other Studies Reviewed:    The following studies were reviewed today:   EKG:   04/28/2022: Normal sinus rhythm, 89, LVH, Q waves in leads III, aVF, less than 1 mm ST depressions in leads V5/6   Recent Labs: 04/11/2022: Hemoglobin 14.9; Platelets 243; TSH 2.003 06/06/2022: ALT 22; BUN 18; Creatinine, Ser 0.67; Potassium 4.3; Sodium 139  Recent Lipid Panel    Component Value Date/Time   CHOL 185 07/29/2022 1007   TRIG 82 07/29/2022 1007   HDL 92 07/29/2022 1007   CHOLHDL 2.0 07/29/2022 1007   CHOLHDL 2 06/06/2022 0909   VLDL 13.6 06/06/2022 0909   LDLCALC 78 07/29/2022 1007    Physical Exam:    VS:  BP (!) 138/90   Pulse 89   Ht '5\' 1"'$  (1.549 m)   Wt 184 lb (83.5 kg)   SpO2 96%   BMI 34.77 kg/m     Wt Readings from Last 3 Encounters:  07/29/22 184 lb (83.5 kg)  06/06/22 184 lb (83.5 kg)  05/30/22 188 lb (85.3 kg)     GEN:  in no acute distress HEENT: Normal NECK: No JVD; No carotid bruits CARDIAC: RRR, 2 out of 6 systolic  murmur RESPIRATORY:  Clear to auscultation without rales, wheezing or rhonchi  ABDOMEN: Soft, non-tender, non-distended MUSCULOSKELETAL:  No edema; No deformity  SKIN: Warm and dry NEUROLOGIC:  Alert and oriented x 3 PSYCHIATRIC:  Normal affect   ASSESSMENT:    1. Hypertrophic cardiomegaly   2. Essential hypertension   3. Hyperlipidemia, unspecified hyperlipidemia type   4. Chest pain of uncertain etiology   5. Palpitations     PLAN:    Hypertrophic cardiomyopathy: Echocardiogram 05/05/2022 showed severe asymmetric hypertrophy of the septum, peak  gradient across LVOT with Valsalva 20 mmHg, normal biventricular function, mild AI/MR.  Zio patch x7 days on 05/15/2022 showed no significant arrhythmias.  Cardiac MRI 07/23/2022 showed asymmetric LV hypertrophy measuring 16 mm in basal septum (10 mm in posterior wall), meeting criteria for hypertrophic dermopathy, no LGE, normal biventricular size and systolic function, mild mitral regurgitation (regurgitant fraction 11%). -Recommend first-degree relatives be screened.  She will speak to her son -Recommend stopping spironolactone, would favor avoiding diuretics given potential for worsening LVOT obstruction.  Switch to carvedilol 6.25 mg twice daily for hypertension  Chest pain: Atypical in description.  Coronary CTA 05/21/2022 showed calcium score 1 (40th percentile), minimal nonobstructive plaque in LAD.   -Given pain can also occur after eating, could be related to GERD and recommend starting on PPI to see if improvement  Palpitations: Zio patch x7 days on 05/15/2022 showed no significant arrhythmias.  Hypertension: On spironolactone 25 mg daily.  Plan to switch to carvedilol as above.  Asked to check BP daily for next 2 weeks and bring log and home BP monitor to calibrate to appointment with pharmacy hypertension clinic in 2 weeks  Hyperlipidemia: LDL 166 on 08/22/2021.  10-year ASCVD risk score 10%.  Started rosuvastatin 10 mg daily.  Check lipid  panel  RTC in 6 months  Medication Adjustments/Labs and Tests Ordered: Current medicines are reviewed at length with the patient today.  Concerns regarding medicines are outlined above.  Orders Placed This Encounter  Procedures   Lipid panel   Meds ordered this encounter  Medications   carvedilol (COREG) 6.25 MG tablet    Sig: Take 1 tablet (6.25 mg total) by mouth 2 (two) times daily.    Dispense:  180 tablet    Refill:  3    STOP spironolactone    Patient Instructions  Medication Instructions:  STOP spironolactone START carvedilol (Coreg) 6.25 mg two times daily  Please check your blood pressure at home daily, write it down.  Bring log and blood pressure cuff to appointment with pharmacist.   *If you need a refill on your cardiac medications before your next appointment, please call your pharmacy*   Lab Work: Lipid today  If you have labs (blood work) drawn today and your tests are completely normal, you will receive your results only by: [place] (if you have MyChart) OR A paper copy in the mail If you have any lab test that is abnormal or we need to change your treatment, we will call you to review the results.   Follow-Up: At [facility], you and your health needs are our priority.  As part of our continuing mission to provide you with exceptional heart care, we have created designated Provider Care Teams.  These Care Teams include your primary Cardiologist (physician) and Advanced Practice Providers (APPs -  Physician Assistants and Nurse Practitioners) who all work together to provide you with the care you need, when you need it.  We recommend signing up for the patient portal called "MyChart".  Sign up information is provided on this After Visit Summary.  MyChart is used to connect with patients for Virtual Visits (Telemedicine).  Patients are able to view lab/test results, encounter notes, upcoming appointments, etc.  Non-urgent messages can be sent  to your provider as well.   To learn more about what you can do with MyChart, go to [email or web address].    Your next appointment:   2 weeks with pharmacist --bring blood pressure log and blood pressure cuff to  appointment  6 months with Dr. Gardiner Rhyme  Other Instructions Recommend your son be screened for hypertrophic cardiomyopathy           Signed, Donato Heinz, MD  07/31/2022 3:15 PM    [place]

## 2022-07-30 LAB — LIPID PANEL
Chol/HDL Ratio: 2 ratio (ref 0.0–4.4)
Cholesterol, Total: 185 mg/dL (ref 100–199)
HDL: 92 mg/dL (ref >39)
LDL Chol Calc (NIH): 78 mg/dL (ref 0–99)
Triglycerides: 82 mg/dL (ref 0–149)
VLDL Cholesterol Cal: 15 mg/dL (ref 5–40)

## 2022-07-31 ENCOUNTER — Ambulatory Visit: Payer: Medicare HMO | Admitting: Physical Therapy

## 2022-07-31 ENCOUNTER — Encounter: Payer: Self-pay | Admitting: Physical Therapy

## 2022-07-31 DIAGNOSIS — M25612 Stiffness of left shoulder, not elsewhere classified: Secondary | ICD-10-CM | POA: Diagnosis not present

## 2022-07-31 DIAGNOSIS — M25512 Pain in left shoulder: Secondary | ICD-10-CM | POA: Diagnosis not present

## 2022-07-31 NOTE — Therapy (Signed)
OUTPATIENT PHYSICAL THERAPY SHOULDER TREATMENT    Patient Name: Leslie Griffin MRN: [id number] DOB:04/24/1951, 71 y.o., female Today's Date: 07/31/2022   PT End of Session - 07/31/22 0850     Visit Number 15    Date for PT Re-Evaluation 08/13/22    Authorization Type [employer]    PT Start Time [phone]    PT Stop Time 0930    PT Time Calculation (min) 40 min    Activity Tolerance Patient tolerated treatment well    Behavior During Therapy [facility] for tasks assessed/performed             Past Medical History:  Diagnosis Date   Allergic rhinitis    Anxiety    Arthritis    Family history of colonic polyps    Fibromyalgia    Heart murmur    History of colonic polyps    Hypercholesterolemia    Hypertension    Mitral valve prolapse    Ocular migraine    Palpitations    Past Surgical History:  Procedure Laterality Date   ABDOMINAL HYSTERECTOMY     age was in her 79s   COLONOSCOPY  10/05/2017   Mild sigmoid diverticulosis. Otherwise normal colonoscopy.    cyst removal arm Left 1981   from cat scratch fever   EYE SURGERY     bilateral cataract removal   FOOT ARTHRODESIS Left 11/01/2021   Procedure: LEFT TALONAVICULAR AND SUBTALAR FUSION;  Surgeon: Newt Minion, MD;  Location: [place];  Service: Orthopedics;  Laterality: Left;   Patient Active Problem List   Diagnosis Date Noted   Primary osteoarthritis of left shoulder 05/13/2022   Prediabetes 05/01/2022   Murmur 04/17/2022   Posterior tibial tendinitis, left leg    HLD (hyperlipidemia) 08/22/2021   Visual snow syndrome 12/30/2020   Arthropathy of lumbar facet joint 11/12/2020   Chest pain 06/14/2020   Dissociative episodes 06/14/2020   Laceration of right index finger without foreign body without damage to nail 04/03/2020   Multiple drug allergies 04/03/2020   B12 deficiency 03/13/2020   Generalized abdominal pain 03/13/2020   Low serum iron 10/03/2019   Low vitamin D level 10/03/2019   Ocular migraine  10/03/2019   Familial hypercholesterolemia 06/27/2019   Upper GI bleed 06/27/2019   Right foot pain 03/27/2019   Generalized pain 10/08/2018   Hypertension 85/63/1497   Diastolic dysfunction, left ventricle 08/18/2018   Family history of macular degeneration 08/18/2018   GERD (gastroesophageal reflux disease) 08/18/2018   History of cataract surgery 08/18/2018   History of migraine 08/18/2018   Hypertensive cardiomyopathy, without heart failure ([place]) 08/18/2018   Medication refill 08/18/2018   Mixed dyslipidemia 08/18/2018   Obesity, Class II, BMI 35-39.9 08/18/2018   Wellness examination 08/18/2018   Cardiomyopathy ([place]) 02/03/2018   Pain and swelling of ankle, right 01/27/2018   Left-sided low back pain without sciatica 12/04/2017   Fatigue 12/04/2017   Anxiety 11/04/2017   Arthralgia 11/04/2017   History of attempted suicide 11/04/2017   Insomnia 11/04/2017   Moderate episode of recurrent major depressive disorder ([place]) 11/04/2017    PCP: Jeralyn Ruths  REFERRING PROVIDER: [name]  REFERRING DIAG: left shoulder pain  THERAPY DIAG:  Acute pain of left shoulder  Stiffness of left shoulder, not elsewhere classified  Rationale for Evaluation and Treatment Rehabilitation  ONSET DATE: 04/29/22  SUBJECTIVE:  SUBJECTIVE STATEMENT: "All right"   PERTINENT HISTORY: Anxiety, arthritis, fibromyalgia, HTN  PAIN:  Are you having pain? Yes: NPRS scale: 3/10 Pain location: left lateral shoulder left upper arm, c/o some numbness in the fingers Pain description: ache, sharppain >10/10 Aggravating factors: any motions, any movements all ADL's pain >10/10 Relieving factors: reports that nothing has helped the pain, pain at best a 9/10   FALLS:  Has patient fallen in last 6 months? No  LIVING  ENVIRONMENT: Lives with: lives with their family Lives in: House/apartment Stairs: No Has following equipment at home: None   PLOF: Independent Does the cooking and cleaning, some gardening  PATIENT GOALS have less pain, do better with ADL's  OBJECTIVE:   DIAGNOSTIC FINDINGS:  Degenerative changes of the cervical spine, OA changes of the left shoulder  PATIENT SURVEYS:  FOTO 16  POSTURE: Fwd head, rounded shoulders  UPPER EXTREMITY ROM: very painful and very limited motions  Active/PROM ROM Left  Eval 05/13/22 Active Left Eval 05/13/22 Passive Left  AROM 06/05/22 L shoulder AROM  06/19/22 L shoulder AROM 07/03/22 L shoulder AROM 07/31/22  Shoulder flexion 60 70 90 140 153 165  Shoulder extension        Shoulder abduction 30 40 90 130 152 160  Shoulder adduction        Shoulder internal rotation        Shoulder external rotation        Elbow flexion        Elbow extension        Wrist flexion        Wrist extension        Wrist ulnar deviation        Wrist radial deviation        Wrist pronation        Wrist supination        (Blank rows = not tested)  UPPER EXTREMITY MMT:  all motions very painful, could not do flexion and abduction due to pain  MMT Right eval Left Eval 05/13/22  Shoulder flexion    Shoulder extension    Shoulder abduction    Shoulder adduction    Shoulder internal rotation  4-/5  Shoulder external rotation  3+/5  Middle trapezius    Lower trapezius    Elbow flexion    Elbow extension    Wrist flexion    Wrist extension    Wrist ulnar deviation    Wrist radial deviation    Wrist pronation    Wrist supination    Grip strength (lbs)    (Blank rows = not tested)  SHOULDER SPECIAL TESTS:  Impingement tests: Neer impingement test: positive    Rotator cuff assessment: Drop arm test: positive   Biceps assessment: Yergason's test: positive   PALPATION:  Has a lot of spasms in the left upper traps, sore and tender down the left upper  arm   TODAY'S TREATMENT:  07/31/22 UBE L3 x3 min each Shoulder abd 1lb 2x10  Shoulder Flex 1lb 2x10 Standing rows 10lb 2x10 Shoulder Ext 10lb 2x10 Triceps Ext 20lb 2x10 Chest press 5lb 2x10 Rows 20lb 2x10 Lat 20lb 2x10   07/24/22 UBE L2x 3 min each Shoulder flex 1lb 2x10 limited ROM as she fatigue, cues not to use momentum  Shoulder Abd 1lb 2x10  Shoulder Ext 10lb 2x10  Standing rows 10lb 2x12 Triceps Ext 25lb 2x12 Chest press 5lb 2x10 Rows 20lb 2x10 Lat 20lb 2x10  VASO L shoulder 34deg x10 min  07/22/2022 UBE L1.5 x 3 min each Shoulder flex 1lb 2x10 limited ROM as she fatigue  Shoulde abd AROM 2x10 LUE AAROM from therapist ER 2x10 Rows 20lb 2x10 Lats 20lb 2x10 Chest press 5lb 2x15 3lb flex bilat 3lb dumbbell 2x10 Biceps curls 3lb 2x10 Shoulder Ext 5lb 2x1o Triceps Ext 20lb 2x15 LUE IR red 2x15 VASO L shoulder low 34 deg 10 min  07/17/22  UBE L 3 3 min fwd/3 min back Nustep L 4 push/pul 2 min, horz abd 2 min Educ  on ex in water to do as she just joined [employer] ext 12 x 2 sets 20# Bicep 10# 2 sets 12 wt ball reaching behind and around back 10 x each direction ( red ball) 2# flex limited flex 10 x Chest press 5# 2x10 Seated rows 20# 2x12 Lats 20# 2x12  VASO left shld   07/10/22 UBE L2 x3 min each Wall slides and wall circles with 1# Shoulder Abd 3x5  Chest press 5# 2x10 Seated rows 20# 2x12 Lats 20# 2x12 Triceps Ext 20lb 2x12 LUE IR yellow 2x10 LUE ER yellow 2x10 very weak  Vaso medium pressure left shoulder   07/07/22 UBE level 2 x 6 minutes Wall slides and wall circles with 1# and without weight, tried some scaption Chest press 5# 2x10 Seated rows 20# 2x10 Lats 20# 2x10 Yellow tband ER very minimal motions Yellow tband small abduction very weak Seated ER at 90 degrees abduction Used UE ranger with yellow tband all motions with the resistance 3# bar biceps, extension, behind back IR Vaso medium pressure left shoulder   07/03/22 UBE  L1.5 x3 min each Seated rows 20lb & Lats 20lb 2x10. Chest press 5lb 2x10  Shoulder Ext 5lb 2x10 Shoulder Flex 2x10 AROM  Shoulder Abd 2x10 Triceps Ext 20lb 2x10 Biceps Curls 2lb 2x10 Wall circles with pillowcase  1lb flexion up wall with pillow case LUE x10 fatigue aster 7 reps Vaso L shoulder low pressure 34 deg x 10 min   07/01/22 UBE 1.5 x 3 min each Seated rows & Lats  15lb 2x10 Shoulder Ext 5lb 2x10 Shoulder Abd 2x10 AROM  Triceps Ext 20lb 2x10 Biceps curls 3lb 2x10 Some LUE PROM  Vaso L shoulder Low 34 deg x10 min    06/26/22 UBE L1 x 3 min each Rows 10lb Lats 15lb 2x10  Shoulder Ext red 2x10 Shoulder Ext 15lb 2x15  Shoulder flex cane 2x5 Shoulder Abd  x10 Vaso L shoulder Low 34 deg x10 min     PATIENT EDUCATION: Education details: HEP Person educated: Patient Education method: Consulting civil engineer, Media planner, Verbal cues, and Handouts Education comprehension: verbalized understanding   HOME EXERCISE PROGRAM: Thigh slides, shrugs and retractions  ASSESSMENT:  CLINICAL IMPRESSION: Pt ~ 5 min late. She has progressed increasing her LUE AROM. Continue to maximize function through strengthening the L shoulder. Pt has L shoulder does fatigue quick with flexion and abduction but was able to complete the first sets of each intervention before needing rest. Good effort given throughout session.   OBJECTIVE IMPAIRMENTS decreased activity tolerance, decreased ROM, decreased strength, increased muscle spasms, impaired flexibility, impaired sensation, impaired UE functional use, improper body mechanics, postural dysfunction, and pain.   ACTIVITY LIMITATIONS carrying, lifting, sleeping, bed mobility, bathing, toileting, dressing, self feeding, reach over head, hygiene/grooming, and caring for others  PARTICIPATION LIMITATIONS: meal prep, cleaning, laundry, driving, shopping, community activity, and yard work  [employer] POTENTIAL: Good  CLINICAL DECISION MAKING:  Stable/uncomplicated  EVALUATION COMPLEXITY: Low   GOALS: Goals  reviewed with patient? Yes  SHORT TERM GOALS: Target date: 05/27/22  Independent with initial HEP Goal status: met  LONG TERM GOALS: Target date: 08/04/22  Understand posture and body mechanics Goal status: met  2.  Decreaes pain 50% Goal status:ongoing  3.  Increase shoulder AROM to 120 degrees flexion Goal status: Met  4.  Increase shoulder ER to 60 degrees Goal status: ongoing  5.  Report no difficulty doing hair or dressing Goal status: partially met    PLAN: PT FREQUENCY: 1-2x/week  PT DURATION: 12 weeks  PLANNED INTERVENTIONS: Therapeutic exercises, Therapeutic activity, Neuromuscular re-education, Balance training, Gait training, Patient/Family education, Joint manipulation, Joint mobilization, Dry Needling, Electrical stimulation, Cryotherapy, Moist heat, Taping, Ultrasound, and Manual therapy  PLAN FOR NEXT SESSION: continue to try to gain as much function as we can. Scot Jun, PTA

## 2022-08-05 ENCOUNTER — Encounter: Payer: Self-pay | Admitting: Physical Therapy

## 2022-08-05 ENCOUNTER — Ambulatory Visit: Payer: Medicare HMO | Admitting: Physical Therapy

## 2022-08-05 DIAGNOSIS — M25512 Pain in left shoulder: Secondary | ICD-10-CM

## 2022-08-05 DIAGNOSIS — M25612 Stiffness of left shoulder, not elsewhere classified: Secondary | ICD-10-CM | POA: Diagnosis not present

## 2022-08-05 NOTE — Therapy (Signed)
OUTPATIENT PHYSICAL THERAPY SHOULDER TREATMENT    Patient Name: Leslie Griffin MRN: [id number] DOB:08/04/1951, 71 y.o., female Today's Date: 08/05/2022   PT End of Session - 08/05/22 0850     Visit Number 16    Date for PT Re-Evaluation 08/13/22    PT Start Time 0850    PT Stop Time 0935    PT Time Calculation (min) 45 min    Activity Tolerance Patient tolerated treatment well    Behavior During Therapy WFL for tasks assessed/performed             Past Medical History:  Diagnosis Date   Allergic rhinitis    Anxiety    Arthritis    Family history of colonic polyps    Fibromyalgia    Heart murmur    History of colonic polyps    Hypercholesterolemia    Hypertension    Mitral valve prolapse    Ocular migraine    Palpitations    Past Surgical History:  Procedure Laterality Date   ABDOMINAL HYSTERECTOMY     age was in her 30s   COLONOSCOPY  10/05/2017   Mild sigmoid diverticulosis. Otherwise normal colonoscopy.    cyst removal arm Left 1981   from cat scratch fever   EYE SURGERY     bilateral cataract removal   FOOT ARTHRODESIS Left 11/01/2021   Procedure: LEFT TALONAVICULAR AND SUBTALAR FUSION;  Surgeon: Duda, Marcus V, MD;  Location: [facility];  Service: Orthopedics;  Laterality: Left;   Patient Active Problem List   Diagnosis Date Noted   Primary osteoarthritis of left shoulder 05/13/2022   Prediabetes 05/01/2022   Murmur 04/17/2022   Posterior tibial tendinitis, left leg    HLD (hyperlipidemia) 08/22/2021   Visual snow syndrome 12/30/2020   Arthropathy of lumbar facet joint 11/12/2020   Chest pain 06/14/2020   Dissociative episodes 06/14/2020   Laceration of right index finger without foreign body without damage to nail 04/03/2020   Multiple drug allergies 04/03/2020   B12 deficiency 03/13/2020   Generalized abdominal pain 03/13/2020   Low serum iron 10/03/2019   Low vitamin D level 10/03/2019   Ocular migraine 10/03/2019   Familial hypercholesterolemia  06/27/2019   Upper GI bleed 06/27/2019   Right foot pain 03/27/2019   Generalized pain 10/08/2018   Hypertension 08/18/2018   Diastolic dysfunction, left ventricle 08/18/2018   Family history of macular degeneration 08/18/2018   GERD (gastroesophageal reflux disease) 08/18/2018   History of cataract surgery 08/18/2018   History of migraine 08/18/2018   Hypertensive cardiomyopathy, without heart failure (HCC) 08/18/2018   Medication refill 08/18/2018   Mixed dyslipidemia 08/18/2018   Obesity, Class II, BMI 35-39.9 08/18/2018   Wellness examination 08/18/2018   Cardiomyopathy (HCC) 02/03/2018   Pain and swelling of ankle, right 01/27/2018   Left-sided low back pain without sciatica 12/04/2017   Fatigue 12/04/2017   Anxiety 11/04/2017   Arthralgia 11/04/2017   History of attempted suicide 11/04/2017   Insomnia 11/04/2017   Moderate episode of recurrent major depressive disorder (HCC) 11/04/2017    PCP: Sarah Gray  REFERRING PROVIDER: Evan Corey  REFERRING DIAG: left shoulder pain  THERAPY DIAG:  Acute pain of left shoulder  Stiffness of left shoulder, not elsewhere classified  Rationale for Evaluation and Treatment Rehabilitation  ONSET DATE: 04/29/22  SUBJECTIVE:                                                                                                                                                                                        SUBJECTIVE STATEMENT: "Rough, woke up with a headache" "Shoulder hurts all the time"   PERTINENT HISTORY: Anxiety, arthritis, fibromyalgia, HTN  PAIN:  Are you having pain? Yes: NPRS scale: 4/10 Pain location: left lateral shoulder left upper arm, c/o some numbness in the fingers Pain description: ache, sharppain >10/10 Aggravating factors: any motions, any movements all ADL's pain >10/10 Relieving factors: reports that nothing has helped the pain, pain at best a 9/10   FALLS:  Has patient fallen in last 6 months?  No  LIVING ENVIRONMENT: Lives with: lives with their family Lives in: House/apartment Stairs: No Has following equipment at home: None   PLOF: Independent Does the cooking and cleaning, some gardening  PATIENT GOALS have less pain, do better with ADL's  OBJECTIVE:   DIAGNOSTIC FINDINGS:  Degenerative changes of the cervical spine, OA changes of the left shoulder  PATIENT SURVEYS:  FOTO 16  POSTURE: Fwd head, rounded shoulders  UPPER EXTREMITY ROM: very painful and very limited motions  Active/PROM ROM Left  Eval 05/13/22 Active Left Eval 05/13/22 Passive Left  AROM 06/05/22 L shoulder AROM  06/19/22 L shoulder AROM 07/03/22 L shoulder AROM 07/31/22  Shoulder flexion 60 70 90 140 153 165  Shoulder extension        Shoulder abduction 30 40 90 130 152 160  Shoulder adduction        Shoulder internal rotation        Shoulder external rotation        Elbow flexion        Elbow extension        Wrist flexion        Wrist extension        Wrist ulnar deviation        Wrist radial deviation        Wrist pronation        Wrist supination        (Blank rows = not tested)  UPPER EXTREMITY MMT:  all motions very painful, could not do flexion and abduction due to pain  MMT Right eval Left Eval 05/13/22  Shoulder flexion    Shoulder extension    Shoulder abduction    Shoulder adduction    Shoulder internal rotation  4-/5  Shoulder external rotation  3+/5  Middle trapezius    Lower trapezius    Elbow flexion    Elbow extension    Wrist flexion    Wrist extension    Wrist ulnar deviation    Wrist radial deviation    Wrist pronation    Wrist supination    Grip strength (lbs)    (Blank rows = not tested)  SHOULDER SPECIAL TESTS:  Impingement tests: Neer impingement test: positive    Rotator cuff assessment: Drop arm test: positive   Biceps assessment: Yergason's test: positive   PALPATION:  Has a lot of spasms in the left upper traps, sore and tender down the  left upper arm   TODAY'S TREATMENT:  08/05/22 UBE L2 x 3 min each Rows 20lb 2x10 Lat 20lb 2x10 Chest press 5lb 2x12 Shoulder flex 1lb 2x10 Lateral raises 1lb 2x10 Shoulder Ext 5lb 2x10 Triceps Ext 20lb 2x10 Horiz Abd yellow x9 stopped due to weakness  VASO L shoulder 34deg x10 min   07/31/22 UBE L3 x3 min each Shoulder abd 1lb 2x10  Shoulder Flex 1lb 2x10 Standing rows 10lb 2x10 Shoulder Ext 10lb 2x10 Triceps Ext 20lb 2x10 Chest press 5lb 2x10 Rows 20lb 2x10 Lat 20lb 2x10  VASO L shoulder 34deg x10 min 07/24/22 UBE L2x 3 min each Shoulder flex 1lb 2x10 limited ROM as she fatigue, cues not to use momentum  Shoulder Abd 1lb 2x10  Shoulder Ext 10lb 2x10  Standing rows 10lb 2x12 Triceps Ext 25lb 2x12 Chest press 5lb 2x10 Rows 20lb 2x10 Lat 20lb 2x10  VASO L shoulder 34deg x10 min    07/22/2022 UBE L1.5 x 3 min each Shoulder flex 1lb 2x10 limited ROM as she fatigue  Shoulde abd AROM 2x10 LUE AAROM from therapist ER 2x10 Rows 20lb 2x10 Lats 20lb 2x10 Chest press 5lb 2x15 3lb flex bilat 3lb dumbbell 2x10 Biceps curls 3lb 2x10 Shoulder Ext 5lb 2x1o Triceps Ext 20lb 2x15 LUE IR red 2x15 VASO L shoulder low 34 deg 10 min    PATIENT EDUCATION: Education details: HEP Person educated: Patient Education method: Explanation, Demonstration, Verbal cues, and Handouts Education comprehension: verbalized understanding   HOME EXERCISE PROGRAM: Thigh slides, shrugs and retractions  ASSESSMENT:  CLINICAL IMPRESSION: Pt ~ 5 min late. Continue to maximize function through strengthening the L shoulder. Pt is aware of that she has a L shoulder rotator cuff tear, so weakness is expected. Pt L shoulder does fatigue quick with flexion and abduction but was able to complete the first sets of each intervention before needing rest. Pt had a limited tolerance of [place] horizontal abduction due to weakness. Good effort given throughout session.   OBJECTIVE IMPAIRMENTS decreased  activity tolerance, decreased ROM, decreased strength, increased muscle spasms, impaired flexibility, impaired sensation, impaired UE functional use, improper body mechanics, postural dysfunction, and pain.   ACTIVITY LIMITATIONS carrying, lifting, sleeping, bed mobility, bathing, toileting, dressing, self feeding, reach over head, hygiene/grooming, and caring for others  PARTICIPATION LIMITATIONS: meal prep, cleaning, laundry, driving, shopping, community activity, and yard work  REHAB POTENTIAL: Good  CLINICAL DECISION MAKING: Stable/uncomplicated  EVALUATION COMPLEXITY: Low   GOALS: Goals reviewed with patient? Yes  SHORT TERM GOALS: Target date: 05/27/22  Independent with initial HEP Goal status: met  LONG TERM GOALS: Target date: 08/04/22  Understand posture and body mechanics Goal status: met  2.  Decreaes pain 50% Goal status:ongoing  3.  Increase shoulder AROM to 120 degrees flexion Goal status: Met  4.  Increase shoulder ER to 60 degrees Goal status: ongoing  5.  Report no difficulty doing hair or dressing Goal status: partially met    PLAN: PT FREQUENCY: 1-2x/week  PT DURATION: 12 weeks  PLANNED INTERVENTIONS: Therapeutic exercises, Therapeutic activity, Neuromuscular re-education, Balance training, Gait training, Patient/Family education, Joint manipulation, Joint mobilization, Dry Needling, Electrical stimulation, Cryotherapy, Moist heat, Taping, Ultrasound, and Manual therapy  PLAN FOR NEXT SESSION: continue to try to gain as much function as we can. Ronald G Pemberton, PTA 

## 2022-08-07 ENCOUNTER — Ambulatory Visit: Payer: Medicare HMO | Admitting: Physical Therapy

## 2022-08-07 ENCOUNTER — Encounter: Payer: Self-pay | Admitting: Physical Therapy

## 2022-08-07 DIAGNOSIS — M25512 Pain in left shoulder: Secondary | ICD-10-CM | POA: Diagnosis not present

## 2022-08-07 DIAGNOSIS — M25612 Stiffness of left shoulder, not elsewhere classified: Secondary | ICD-10-CM | POA: Diagnosis not present

## 2022-08-07 NOTE — Therapy (Signed)
OUTPATIENT PHYSICAL THERAPY SHOULDER TREATMENT    Patient Name: Leslie Griffin MRN: [id number] DOB:Apr 02, 1951, 71 y.o., female Today's Date: 08/07/2022   PT End of Session - 08/07/22 0848     Visit Number 17    Date for PT Re-Evaluation 08/13/22    PT Start Time 0845    PT Stop Time 0940    PT Time Calculation (min) 55 min    Activity Tolerance Patient tolerated treatment well    Behavior During Therapy [facility] for tasks assessed/performed             Past Medical History:  Diagnosis Date   Allergic rhinitis    Anxiety    Arthritis    Family history of colonic polyps    Fibromyalgia    Heart murmur    History of colonic polyps    Hypercholesterolemia    Hypertension    Mitral valve prolapse    Ocular migraine    Palpitations    Past Surgical History:  Procedure Laterality Date   ABDOMINAL HYSTERECTOMY     age was in her 73s   COLONOSCOPY  10/05/2017   Mild sigmoid diverticulosis. Otherwise normal colonoscopy.    cyst removal arm Left 1981   from cat scratch fever   EYE SURGERY     bilateral cataract removal   FOOT ARTHRODESIS Left 11/01/2021   Procedure: LEFT TALONAVICULAR AND SUBTALAR FUSION;  Surgeon: Newt Minion, MD;  Location: [place];  Service: Orthopedics;  Laterality: Left;   Patient Active Problem List   Diagnosis Date Noted   Primary osteoarthritis of left shoulder 05/13/2022   Prediabetes 05/01/2022   Murmur 04/17/2022   Posterior tibial tendinitis, left leg    HLD (hyperlipidemia) 08/22/2021   Visual snow syndrome 12/30/2020   Arthropathy of lumbar facet joint 11/12/2020   Chest pain 06/14/2020   Dissociative episodes 06/14/2020   Laceration of right index finger without foreign body without damage to nail 04/03/2020   Multiple drug allergies 04/03/2020   B12 deficiency 03/13/2020   Generalized abdominal pain 03/13/2020   Low serum iron 10/03/2019   Low vitamin D level 10/03/2019   Ocular migraine 10/03/2019   Familial hypercholesterolemia  06/27/2019   Upper GI bleed 06/27/2019   Right foot pain 03/27/2019   Generalized pain 10/08/2018   Hypertension 37/34/2876   Diastolic dysfunction, left ventricle 08/18/2018   Family history of macular degeneration 08/18/2018   GERD (gastroesophageal reflux disease) 08/18/2018   History of cataract surgery 08/18/2018   History of migraine 08/18/2018   Hypertensive cardiomyopathy, without heart failure ([place]) 08/18/2018   Medication refill 08/18/2018   Mixed dyslipidemia 08/18/2018   Obesity, Class II, BMI 35-39.9 08/18/2018   Wellness examination 08/18/2018   Cardiomyopathy ([place]) 02/03/2018   Pain and swelling of ankle, right 01/27/2018   Left-sided low back pain without sciatica 12/04/2017   Fatigue 12/04/2017   Anxiety 11/04/2017   Arthralgia 11/04/2017   History of attempted suicide 11/04/2017   Insomnia 11/04/2017   Moderate episode of recurrent major depressive disorder ([place]) 11/04/2017    PCP: Jeralyn Ruths  REFERRING PROVIDER: [name]  REFERRING DIAG: left shoulder pain  THERAPY DIAG:  Acute pain of left shoulder  Stiffness of left shoulder, not elsewhere classified  Rationale for Evaluation and Treatment Rehabilitation  ONSET DATE: 04/29/22  SUBJECTIVE:  SUBJECTIVE STATEMENT: Doing ok today. Yesterday had a lot pf pain and a lot of weakness in the L shoulder. Woke up fine yesterday shoulder got progressively worst. No known reason   PERTINENT HISTORY: Anxiety, arthritis, fibromyalgia, HTN  PAIN:  Are you having pain? Yes: NPRS scale: 3/10 Pain location: left lateral shoulder left upper arm, c/o some numbness in the fingers Pain description: ache, sharppain >10/10 Aggravating factors: any motions, any movements all ADL's pain >10/10 Relieving factors: reports that nothing has  helped the pain, pain at best a 9/10   FALLS:  Has patient fallen in last 6 months? No  LIVING ENVIRONMENT: Lives with: lives with their family Lives in: House/apartment Stairs: No Has following equipment at home: None   PLOF: Independent Does the cooking and cleaning, some gardening  PATIENT GOALS have less pain, do better with ADL's  OBJECTIVE:   DIAGNOSTIC FINDINGS:  Degenerative changes of the cervical spine, OA changes of the left shoulder  PATIENT SURVEYS:  FOTO 16  POSTURE: Fwd head, rounded shoulders  UPPER EXTREMITY ROM: very painful and very limited motions  Active/PROM ROM Left  Eval 05/13/22 Active Left Eval 05/13/22 Passive Left  AROM 06/05/22 L shoulder AROM  06/19/22 L shoulder AROM 07/03/22 L shoulder AROM 07/31/22 L shoulder AROM 08/07/22  Shoulder flexion 60 70 90 140 153 165 166  Shoulder extension         Shoulder abduction 30 40 90 130 152 160 163  Shoulder adduction         Shoulder internal rotation         Shoulder external rotation         Elbow flexion         Elbow extension         Wrist flexion         Wrist extension         Wrist ulnar deviation         Wrist radial deviation         Wrist pronation         Wrist supination         (Blank rows = not tested)  UPPER EXTREMITY MMT:  all motions very painful, could not do flexion and abduction due to pain  MMT Right eval Left Eval 05/13/22 L  08/07/22  Shoulder flexion   3+  Shoulder extension     Shoulder abduction   3+  Shoulder adduction     Shoulder internal rotation  4-/5 4  Shoulder external rotation  3+/5 3  Middle trapezius     Lower trapezius     Elbow flexion     Elbow extension     Wrist flexion     Wrist extension     Wrist ulnar deviation     Wrist radial deviation     Wrist pronation     Wrist supination     Grip strength (lbs)     (Blank rows = not tested)  SHOULDER SPECIAL TESTS:  Impingement tests: Neer impingement test: positive    Rotator cuff  assessment: Drop arm test: positive   Biceps assessment: Yergason's test: positive   PALPATION:  Has a lot of spasms in the left upper traps, sore and tender down the left upper arm   TODAY'S TREATMENT:  08/07/22 UBE L2.6 x3 min each Seated Rows 25lb 2x10 Lat Pull downs 20lb 2x10 Chest press 5lb 2x12 Front Raises 1lb 2x10 Shoulder abd 1lb 2x10 Shoulder Ext 10lb 2x10 Biceps  Curls 3lb 2x10 Tricep Ext 25lb 2x12 VASO L shoulder 34deg x10 min   08/05/22 UBE L2 x 3 min each Rows 20lb 2x10 Lat 20lb 2x10 Chest press 5lb 2x12 Shoulder flex 1lb 2x10 Lateral raises 1lb 2x10 Shoulder Ext 5lb 2x10 Triceps Ext 20lb 2x10 Horiz Abd yellow x9 stopped due to weakness  VASO L shoulder 34deg x10 min   07/31/22 UBE L3 x3 min each Shoulder abd 1lb 2x10  Shoulder Flex 1lb 2x10 Standing rows 10lb 2x10 Shoulder Ext 10lb 2x10 Triceps Ext 20lb 2x10 Chest press 5lb 2x10 Rows 20lb 2x10 Lat 20lb 2x10  VASO L shoulder 34deg x10 min 07/24/22 UBE L2x 3 min each Shoulder flex 1lb 2x10 limited ROM as she fatigue, cues not to use momentum  Shoulder Abd 1lb 2x10  Shoulder Ext 10lb 2x10  Standing rows 10lb 2x12 Triceps Ext 25lb 2x12 Chest press 5lb 2x10 Rows 20lb 2x10 Lat 20lb 2x10  VASO L shoulder 34deg x10 min   PATIENT EDUCATION: Education details: HEP Person educated: Patient Education method: Consulting civil engineer, Media planner, Verbal cues, and Handouts Education comprehension: verbalized understanding   HOME EXERCISE PROGRAM: Thigh slides, shrugs and retractions  ASSESSMENT:  CLINICAL IMPRESSION: Pt had good L shoulder ROM but is remains weak. Continues to progress with UE and postural strengthening. Pt is aware of a rotator cuff tear and possible limitations. Continue to progress to maximize function   OBJECTIVE IMPAIRMENTS decreased activity tolerance, decreased ROM, decreased strength, increased muscle spasms, impaired flexibility, impaired sensation, impaired UE functional use, improper  body mechanics, postural dysfunction, and pain.   ACTIVITY LIMITATIONS carrying, lifting, sleeping, bed mobility, bathing, toileting, dressing, self feeding, reach over head, hygiene/grooming, and caring for others  PARTICIPATION LIMITATIONS: meal prep, cleaning, laundry, driving, shopping, community activity, and yard work  [employer] POTENTIAL: Good  CLINICAL DECISION MAKING: Stable/uncomplicated  EVALUATION COMPLEXITY: Low   GOALS: Goals reviewed with patient? Yes  SHORT TERM GOALS: Target date: 05/27/22  Independent with initial HEP Goal status: met  LONG TERM GOALS: Target date: 08/04/22  Understand posture and body mechanics Goal status: met  2.  Decreaes pain 50% Goal status:ongoing  3.  Increase shoulder AROM to 120 degrees flexion Goal status: Met  4.  Increase shoulder ER to 60 degrees Goal status: ongoing  5.  Report no difficulty doing hair or dressing Goal status: partially met    PLAN: PT FREQUENCY: 1-2x/week  PT DURATION: 12 weeks  PLANNED INTERVENTIONS: Therapeutic exercises, Therapeutic activity, Neuromuscular re-education, Balance training, Gait training, Patient/Family education, Joint manipulation, Joint mobilization, Dry Needling, Electrical stimulation, Cryotherapy, Moist heat, Taping, Ultrasound, and Manual therapy  PLAN FOR NEXT SESSION: continue to try to gain as much function as we can. Scot Jun, PTA

## 2022-08-08 DIAGNOSIS — F321 Major depressive disorder, single episode, moderate: Secondary | ICD-10-CM | POA: Diagnosis not present

## 2022-08-08 DIAGNOSIS — E669 Obesity, unspecified: Secondary | ICD-10-CM | POA: Diagnosis not present

## 2022-08-08 DIAGNOSIS — K219 Gastro-esophageal reflux disease without esophagitis: Secondary | ICD-10-CM | POA: Diagnosis not present

## 2022-08-08 DIAGNOSIS — Z008 Encounter for other general examination: Secondary | ICD-10-CM | POA: Diagnosis not present

## 2022-08-08 DIAGNOSIS — M797 Fibromyalgia: Secondary | ICD-10-CM | POA: Diagnosis not present

## 2022-08-08 DIAGNOSIS — Z7952 Long term (current) use of systemic steroids: Secondary | ICD-10-CM | POA: Diagnosis not present

## 2022-08-08 DIAGNOSIS — Z87892 Personal history of anaphylaxis: Secondary | ICD-10-CM | POA: Diagnosis not present

## 2022-08-08 DIAGNOSIS — R69 Illness, unspecified: Secondary | ICD-10-CM | POA: Diagnosis not present

## 2022-08-08 DIAGNOSIS — I10 Essential (primary) hypertension: Secondary | ICD-10-CM | POA: Diagnosis not present

## 2022-08-08 DIAGNOSIS — Z6835 Body mass index (BMI) 35.0-35.9, adult: Secondary | ICD-10-CM | POA: Diagnosis not present

## 2022-08-08 DIAGNOSIS — E785 Hyperlipidemia, unspecified: Secondary | ICD-10-CM | POA: Diagnosis not present

## 2022-08-08 DIAGNOSIS — I422 Other hypertrophic cardiomyopathy: Secondary | ICD-10-CM | POA: Diagnosis not present

## 2022-08-08 DIAGNOSIS — R32 Unspecified urinary incontinence: Secondary | ICD-10-CM | POA: Diagnosis not present

## 2022-08-08 DIAGNOSIS — Z604 Social exclusion and rejection: Secondary | ICD-10-CM | POA: Diagnosis not present

## 2022-08-09 NOTE — Progress Notes (Signed)
Office Visit Note   Patient: Leslie Griffin           Date of Birth: 1951-01-04           MRN: [id number] Visit Date: 05/30/2022              Requested by: Ailene Ards, NP [place],   [id number] PCP: Ailene Ards, NP   Assessment & Plan: Visit Diagnoses:  1. Pain in left foot     Plan: Patient will follow-up with Dr. Sharol Given in 1 week for recheck.  Return sooner if needed.  She can continue the cam boot that she has for comfort if needed.  Advised patient I do not see an obvious fracture.  Follow-Up Instructions: Return in about 1 week (around 06/06/2022) for WITH DR Hastings TO RECHECK FOOT. .   Orders:  Orders Placed This Encounter  Procedures   XR Foot Complete Left   No orders of the defined types were placed in this encounter.     Procedures: No procedures performed   Clinical Data: No additional findings.   Subjective: Chief Complaint  Patient presents with   Left Foot - Injury    Injury   71 year old white female who is established patient with Dr. Due to comes in with complaints of left foot injury.  She states that she fell yesterday and has been wearing her cam boot.  He is done previous surgery on the left foot.  She was walking and she got her great toe caught on the rug.  Has had some bruising. Review of Systems No current complaints of cardiopulmonary GI/GU issues  Objective: Vital Signs: BP 138/78   Pulse (!) 105   Ht '5\' 1"'$  (1.549 m)   Wt 188 lb (85.3 kg)   BMI 35.52 kg/m   Physical Exam HENT:     Head: Normocephalic and atraumatic.  Pulmonary:     Effort: Pulmonary effort is normal.  Musculoskeletal:     Comments: Left foot she does have tenderness at the first MTP joint and some swelling and bruising around the great toe.  Neurological:     Mental Status: She is alert and oriented to person, place, and time.     Ortho Exam  Specialty Comments:  No specialty comments available.  Imaging: No results  found.   PMFS History: Patient Active Problem List   Diagnosis Date Noted   Primary osteoarthritis of left shoulder 05/13/2022   Prediabetes 05/01/2022   Murmur 04/17/2022   Posterior tibial tendinitis, left leg    HLD (hyperlipidemia) 08/22/2021   Visual snow syndrome 12/30/2020   Arthropathy of lumbar facet joint 11/12/2020   Chest pain 06/14/2020   Dissociative episodes 06/14/2020   Laceration of right index finger without foreign body without damage to nail 04/03/2020   Multiple drug allergies 04/03/2020   B12 deficiency 03/13/2020   Generalized abdominal pain 03/13/2020   Low serum iron 10/03/2019   Low vitamin D level 10/03/2019   Ocular migraine 10/03/2019   Familial hypercholesterolemia 06/27/2019   Upper GI bleed 06/27/2019   Right foot pain 03/27/2019   Generalized pain 10/08/2018   Hypertension 93/71/6967   Diastolic dysfunction, left ventricle 08/18/2018   Family history of macular degeneration 08/18/2018   GERD (gastroesophageal reflux disease) 08/18/2018   History of cataract surgery 08/18/2018   History of migraine 08/18/2018   Hypertensive cardiomyopathy, without heart failure ([place]) 08/18/2018   Medication refill 08/18/2018   Mixed dyslipidemia 08/18/2018  Obesity, Class II, BMI 35-39.9 08/18/2018   Wellness examination 08/18/2018   Cardiomyopathy ([place]) 02/03/2018   Pain and swelling of ankle, right 01/27/2018   Left-sided low back pain without sciatica 12/04/2017   Fatigue 12/04/2017   Anxiety 11/04/2017   Arthralgia 11/04/2017   History of attempted suicide 11/04/2017   Insomnia 11/04/2017   Moderate episode of recurrent major depressive disorder ([place]) 11/04/2017   Past Medical History:  Diagnosis Date   Allergic rhinitis    Anxiety    Arthritis    Family history of colonic polyps    Fibromyalgia    Heart murmur    History of colonic polyps    Hypercholesterolemia    Hypertension    Mitral valve prolapse    Ocular migraine    Palpitations      Family History  Problem Relation Age of Onset   Coronary artery disease Other    Heart disease Mother    Heart disease Father    Colon polyps Sister    Other Sister        low hemoglobin   Colon cancer Neg Hx    Esophageal cancer Neg Hx    Rectal cancer Neg Hx    Stomach cancer Neg Hx     Past Surgical History:  Procedure Laterality Date   ABDOMINAL HYSTERECTOMY     age was in her 70s   COLONOSCOPY  10/05/2017   Mild sigmoid diverticulosis. Otherwise normal colonoscopy.    cyst removal arm Left 1981   from cat scratch fever   EYE SURGERY     bilateral cataract removal   FOOT ARTHRODESIS Left 11/01/2021   Procedure: LEFT TALONAVICULAR AND SUBTALAR FUSION;  Surgeon: Newt Minion, MD;  Location: [place];  Service: Orthopedics;  Laterality: Left;   Social History   Occupational History   Not on file  Tobacco Use   Smoking status: Never   Smokeless tobacco: Never  Vaping Use   Vaping Use: Never used  Substance and Sexual Activity   Alcohol use: Yes    Comment: ocassionally    Drug use: No   Sexual activity: Not on file

## 2022-08-12 ENCOUNTER — Encounter: Payer: Self-pay | Admitting: Physical Therapy

## 2022-08-12 ENCOUNTER — Ambulatory Visit: Payer: Medicare HMO | Admitting: Physical Therapy

## 2022-08-12 DIAGNOSIS — M25512 Pain in left shoulder: Secondary | ICD-10-CM

## 2022-08-12 DIAGNOSIS — M25612 Stiffness of left shoulder, not elsewhere classified: Secondary | ICD-10-CM | POA: Diagnosis not present

## 2022-08-12 NOTE — Therapy (Signed)
OUTPATIENT PHYSICAL THERAPY SHOULDER TREATMENT    Patient Name: Leslie Griffin MRN: [id number] DOB:08-Mar-1951, 71 y.o., female Today's Date: 08/12/2022   PT End of Session - 08/12/22 0848     Visit Number 18    Date for PT Re-Evaluation 08/13/22    PT Start Time 0846    PT Stop Time 0930    PT Time Calculation (min) 44 min    Activity Tolerance Patient tolerated treatment well    Behavior During Therapy [facility] for tasks assessed/performed             Past Medical History:  Diagnosis Date   Allergic rhinitis    Anxiety    Arthritis    Family history of colonic polyps    Fibromyalgia    Heart murmur    History of colonic polyps    Hypercholesterolemia    Hypertension    Mitral valve prolapse    Ocular migraine    Palpitations    Past Surgical History:  Procedure Laterality Date   ABDOMINAL HYSTERECTOMY     age was in her 63s   COLONOSCOPY  10/05/2017   Mild sigmoid diverticulosis. Otherwise normal colonoscopy.    cyst removal arm Left 1981   from cat scratch fever   EYE SURGERY     bilateral cataract removal   FOOT ARTHRODESIS Left 11/01/2021   Procedure: LEFT TALONAVICULAR AND SUBTALAR FUSION;  Surgeon: Newt Minion, MD;  Location: [place];  Service: Orthopedics;  Laterality: Left;   Patient Active Problem List   Diagnosis Date Noted   Primary osteoarthritis of left shoulder 05/13/2022   Prediabetes 05/01/2022   Murmur 04/17/2022   Posterior tibial tendinitis, left leg    HLD (hyperlipidemia) 08/22/2021   Visual snow syndrome 12/30/2020   Arthropathy of lumbar facet joint 11/12/2020   Chest pain 06/14/2020   Dissociative episodes 06/14/2020   Laceration of right index finger without foreign body without damage to nail 04/03/2020   Multiple drug allergies 04/03/2020   B12 deficiency 03/13/2020   Generalized abdominal pain 03/13/2020   Low serum iron 10/03/2019   Low vitamin D level 10/03/2019   Ocular migraine 10/03/2019   Familial hypercholesterolemia  06/27/2019   Upper GI bleed 06/27/2019   Right foot pain 03/27/2019   Generalized pain 10/08/2018   Hypertension 17/79/3903   Diastolic dysfunction, left ventricle 08/18/2018   Family history of macular degeneration 08/18/2018   GERD (gastroesophageal reflux disease) 08/18/2018   History of cataract surgery 08/18/2018   History of migraine 08/18/2018   Hypertensive cardiomyopathy, without heart failure ([place]) 08/18/2018   Medication refill 08/18/2018   Mixed dyslipidemia 08/18/2018   Obesity, Class II, BMI 35-39.9 08/18/2018   Wellness examination 08/18/2018   Cardiomyopathy ([place]) 02/03/2018   Pain and swelling of ankle, right 01/27/2018   Left-sided low back pain without sciatica 12/04/2017   Fatigue 12/04/2017   Anxiety 11/04/2017   Arthralgia 11/04/2017   History of attempted suicide 11/04/2017   Insomnia 11/04/2017   Moderate episode of recurrent major depressive disorder ([place]) 11/04/2017    PCP: Jeralyn Ruths  REFERRING PROVIDER: [name]  REFERRING DIAG: left shoulder pain  THERAPY DIAG:  Acute pain of left shoulder  Stiffness of left shoulder, not elsewhere classified  Rationale for Evaluation and Treatment Rehabilitation  ONSET DATE: 04/29/22  SUBJECTIVE:  SUBJECTIVE STATEMENT: Shoulder is sore but tolerable    PERTINENT HISTORY: Anxiety, arthritis, fibromyalgia, HTN  PAIN:  Are you having pain? Yes: NPRS scale: 2/10 Pain location: left lateral shoulder left upper arm, c/o some numbness in the fingers Pain description: ache, sharppain >10/10 Aggravating factors: any motions, any movements all ADL's pain >10/10 Relieving factors: reports that nothing has helped the pain, pain at best a 9/10   FALLS:  Has patient fallen in last 6 months? No  LIVING ENVIRONMENT: Lives with:  lives with their family Lives in: House/apartment Stairs: No Has following equipment at home: None   PLOF: Independent Does the cooking and cleaning, some gardening  PATIENT GOALS have less pain, do better with ADL's  OBJECTIVE:   DIAGNOSTIC FINDINGS:  Degenerative changes of the cervical spine, OA changes of the left shoulder  PATIENT SURVEYS:  FOTO 16  POSTURE: Fwd head, rounded shoulders  UPPER EXTREMITY ROM: very painful and very limited motions  Active/PROM ROM Left  Eval 05/13/22 Active Left Eval 05/13/22 Passive Left  AROM 06/05/22 L shoulder AROM  06/19/22 L shoulder AROM 07/03/22 L shoulder AROM 07/31/22 L shoulder AROM 08/07/22  Shoulder flexion 60 70 90 140 153 165 166  Shoulder extension         Shoulder abduction 30 40 90 130 152 160 163  Shoulder adduction         Shoulder internal rotation         Shoulder external rotation         Elbow flexion         Elbow extension         Wrist flexion         Wrist extension         Wrist ulnar deviation         Wrist radial deviation         Wrist pronation         Wrist supination         (Blank rows = not tested)  UPPER EXTREMITY MMT:  all motions very painful, could not do flexion and abduction due to pain  MMT Right eval Left Eval 05/13/22 L  08/07/22  Shoulder flexion   3+  Shoulder extension     Shoulder abduction   3+  Shoulder adduction     Shoulder internal rotation  4-/5 4  Shoulder external rotation  3+/5 3  Middle trapezius     Lower trapezius     Elbow flexion     Elbow extension     Wrist flexion     Wrist extension     Wrist ulnar deviation     Wrist radial deviation     Wrist pronation     Wrist supination     Grip strength (lbs)     (Blank rows = not tested)  SHOULDER SPECIAL TESTS:  Impingement tests: Neer impingement test: positive    Rotator cuff assessment: Drop arm test: positive   Biceps assessment: Yergason's test: positive   PALPATION:  Has a lot of spasms in the  left upper traps, sore and tender down the left upper arm   TODAY'S TREATMENT:  08/12/22 UBE L3 x3 min each Seated Rows 25lb 2x10 Lat Pull downs 20lb 2x10 Chest press 10lb 2x10 Tricep Ext 25lb 2x15 Biceps Curls 3lb 2x10 Front Raises 1lb 2x10 VASO L shoulder 34deg x10 min    08/07/22 UBE L2.6 x3 min each Seated Rows 25lb 2x10 Lat Pull downs 20lb 2x10  Chest press 5lb 2x12 Front Raises 1lb 2x10 Shoulder abd 1lb 2x10 Shoulder Ext 10lb 2x10 Biceps Curls 3lb 2x10 Tricep Ext 25lb 2x12 VASO L shoulder 34deg x10 min   08/05/22 UBE L2 x 3 min each Rows 20lb 2x10 Lat 20lb 2x10 Chest press 5lb 2x12 Shoulder flex 1lb 2x10 Lateral raises 1lb 2x10 Shoulder Ext 5lb 2x10 Triceps Ext 20lb 2x10 Horiz Abd yellow x9 stopped due to weakness  VASO L shoulder 34deg x10 min   07/31/22 UBE L3 x3 min each Shoulder abd 1lb 2x10  Shoulder Flex 1lb 2x10 Standing rows 10lb 2x10 Shoulder Ext 10lb 2x10 Triceps Ext 20lb 2x10 Chest press 5lb 2x10 Rows 20lb 2x10 Lat 20lb 2x10  VASO L shoulder 34deg x10 min   PATIENT EDUCATION: Education details: HEP Person educated: Patient Education method: Consulting civil engineer, Media planner, Verbal cues, and Handouts Education comprehension: verbalized understanding   HOME EXERCISE PROGRAM: Thigh slides, shrugs and retractions  ASSESSMENT:  CLINICAL IMPRESSION: Pt had good L shoulder ROM but is remains weak. Continues to progress with UE and postural strengthening. Pt is aware of her rotator cuff tear and possible limitations. She reports doing well at home and has been going to the gym. Pt agreed to continues her treatment on her own. She reports no functional limitations at ome.   OBJECTIVE IMPAIRMENTS decreased activity tolerance, decreased ROM, decreased strength, increased muscle spasms, impaired flexibility, impaired sensation, impaired UE functional use, improper body mechanics, postural dysfunction, and pain.   ACTIVITY LIMITATIONS carrying, lifting,  sleeping, bed mobility, bathing, toileting, dressing, self feeding, reach over head, hygiene/grooming, and caring for others  PARTICIPATION LIMITATIONS: meal prep, cleaning, laundry, driving, shopping, community activity, and yard work  [employer] POTENTIAL: Good  CLINICAL DECISION MAKING: Stable/uncomplicated  EVALUATION COMPLEXITY: Low   GOALS: Goals reviewed with patient? Yes  SHORT TERM GOALS: Target date: 05/27/22  Independent with initial HEP Goal status: met  LONG TERM GOALS: Target date: 08/04/22  Understand posture and body mechanics Goal status: met  2.  Decreaes pain 50% Goal status: Met  3.  Increase shoulder AROM to 120 degrees flexion Goal status: Met  4.  Increase shoulder ER to 60 degrees Goal status: ongoing  5.  Report no difficulty doing hair or dressing Goal status: partially met    PLAN: PT FREQUENCY: 1-2x/week  PT DURATION: 12 weeks  PLANNED INTERVENTIONS: Therapeutic exercises, Therapeutic activity, Neuromuscular re-education, Balance training, Gait training, Patient/Family education, Joint manipulation, Joint mobilization, Dry Needling, Electrical stimulation, Cryotherapy, Moist heat, Taping, Ultrasound, and Manual therapy  PLAN FOR NEXT SESSION: D/C PT  PHYSICAL THERAPY DISCHARGE SUMMARY  Visits from Start of Care: 18  Patient agrees to discharge. Patient goals were partially met. Patient is being discharged due to being pleased with the current functional level.   Scot Jun, PTA

## 2022-08-25 ENCOUNTER — Ambulatory Visit: Payer: Medicare HMO | Attending: Internal Medicine | Admitting: Pharmacist Clinician (PhC)/ Clinical Pharmacy Specialist

## 2022-08-25 ENCOUNTER — Encounter: Payer: Self-pay | Admitting: Pharmacist Clinician (PhC)/ Clinical Pharmacy Specialist

## 2022-08-25 DIAGNOSIS — I10 Essential (primary) hypertension: Secondary | ICD-10-CM

## 2022-08-25 MED ORDER — AMLODIPINE BESYLATE 5 MG PO TABS: 5.0000 mg | ORAL_TABLET | Freq: Every day | ORAL | 3 refills | Status: DC

## 2022-08-25 NOTE — Progress Notes (Signed)
08/25/2022 Leslie Griffin 1951-07-18 [id number]   HPI:  Leslie Griffin is a 71 y.o. female patient of Dr Gardiner Rhyme, with a PMH below who presents today for hypertension clinic evaluation.  She was seen by Dr. Gardiner Rhyme last month and because of recent diagnosis of cardiomyopathy with LVOT obstruction, he stopped her spironolactone and instead started carvedilol 6.25 mg bid.  Will prefer to avoid diuretics at this time.    Today she brings a list of home readings, as well has her home BP device.  She notes that when she will often check her pressure 2 or 3 times over a 10 minute period and the readings often drop by 10-20 points the longer she sits.  She also complains of ongoing chest pain - a dull ache/pressure that doesn't seem to ever go away, topped with stabbing chest pains that occur after meals.  And she is constantly tired, although this did not change with any significance since starting the carvedilol, and she does have a diagnosis of fibromyalgia.    Past Medical History: Hypertrophic cardiomyopathy Asymmetric hypertrophy of septum  hyperlipidemia 10/22 LDL 166, started rosuvastatin, monitored by cards  Chest pain Trying PPI to determine if GERD related  Pre-diabetes 10/22 A1c 5.8 (down from 6 2/22).       Blood Pressure Goal:  130/80  Current Medications:   carvedilol 6.25 mg bid,   Family Hx:   both died from MI both at 5, brother had CABG x 4, son has cholesterol issues, diabetic, pancreatitis/high trigs  Social Hx: no tobacco, occasional alcohol, coffee most days - 2-3 cups in AM      Diet: not much red meat or fried foods, mostly chicken or fish; vegetables most days    Exercise: tstarted back at [facility] recently,   Home BP readings: home cuff Omron, size appropriate,  27 home readings, mostly in the AM  Average 143/85 (range 102-172/63-99)  HR  69    Intolerances:  no cardiac medication intolerances  Labs: 7/23:  Na 139, K 4.3, Glu 93, BUN 18, SCr 0.67, GFR  88.49   Wt Readings from Last 3 Encounters:  07/29/22 184 lb (83.5 kg)  06/06/22 184 lb (83.5 kg)  05/30/22 188 lb (85.3 kg)   BP Readings from Last 3 Encounters:  08/25/22 132/76  07/29/22 (!) 138/90  06/06/22 130/84   Pulse Readings from Last 3 Encounters:  08/25/22 63  07/29/22 89  06/06/22 75    Current Outpatient Medications  Medication Sig Dispense Refill   amLODipine (NORVASC) 5 MG tablet Take 1 tablet (5 mg total) by mouth daily. 90 tablet 3   carvedilol (COREG) 6.25 MG tablet Take 1 tablet (6.25 mg total) by mouth 2 (two) times daily. 180 tablet 3   cholecalciferol (VITAMIN D) 25 MCG (1000 UNIT) tablet Take 1,000 Units by mouth daily.     cyanocobalamin 1000 MCG tablet Take 1 tablet by mouth daily.     Omega-3 Fatty Acids (FISH OIL) 1200 MG CAPS Take 2,400 mg by mouth daily.     omeprazole (PRILOSEC OTC) 20 MG tablet Take 1 tablet (20 mg total) by mouth daily. 30 tablet 3   pregabalin (LYRICA) 75 MG capsule Take 1 capsule (75 mg total) by mouth 2 (two) times daily as needed. 60 capsule 3   rosuvastatin (CRESTOR) 10 MG tablet Take 1 tablet (10 mg total) by mouth daily. 90 tablet 3   ferrous sulfate 325 (65 FE) MG tablet Take 1 tablet by  mouth daily.     No current facility-administered medications for this visit.    Allergies  Allergen Reactions   Benadryl [Diphenhydramine Hcl] Shortness Of Breath and Rash   Cephalexin Shortness Of Breath and Rash    *Keflex   Duloxetine Other (See Comments)    Violent tremors    Penicillins Shortness Of Breath and Rash    Childhood Reaction    Topiramate Shortness Of Breath   Sulfa Antibiotics Rash    Past Medical History:  Diagnosis Date   Allergic rhinitis    Anxiety    Arthritis    Family history of colonic polyps    Fibromyalgia    Heart murmur    History of colonic polyps    Hypercholesterolemia    Hypertension    Mitral valve prolapse    Ocular migraine    Palpitations     Blood pressure 132/76, pulse  63.      Hypertension Patient with essential hypertension, currently not controlled on carvedilol 6.25 mg bid.  Home readings higher than in office, unsure if due to technique or cuff itself.  Measured for size, she is using an appropriate cuff.  Despite near normal reading in the office, will start her on amlodipine 5 mg daily.  Also encouraged her to cut back on coffee to just 1 cup daily.   She will continue with regular home BP monitoring and I will see her back in 6 weeks for follow up.     Tommy Medal PharmD CPP [place] [address] [place], [place] [address] [phone]

## 2022-08-25 NOTE — Assessment & Plan Note (Signed)
Patient with essential hypertension, currently not controlled on carvedilol 6.25 mg bid.  Home readings higher than in office, unsure if due to technique or cuff itself.  Measured for size, she is using an appropriate cuff.  Despite near normal reading in the office, will start her on amlodipine 5 mg daily.  Also encouraged her to cut back on coffee to just 1 cup daily.   She will continue with regular home BP monitoring and I will see her back in 6 weeks for follow up.

## 2022-08-25 NOTE — Patient Instructions (Addendum)
Return for a a follow up appointment Monday November 20 at 8:30 am  Check your blood pressure at home daily and keep record of the readings.  Take your BP meds as follows:  Start amlodipine 5 mg once daily (morning or evening)  Continue with all other medications  Bring all of your meds, your BP cuff and your record of home blood pressures to your next appointment.  Exercise as you're able, try to walk approximately 30 minutes per day.  Keep salt intake to a minimum, especially watch canned and prepared boxed foods.  Eat more fresh fruits and vegetables and fewer canned items.  Avoid eating in fast food restaurants.    HOW TO TAKE YOUR BLOOD PRESSURE: Rest 5 minutes before taking your blood pressure.  Don't smoke or drink caffeinated beverages for at least 30 minutes before. Take your blood pressure before (not after) you eat. Sit comfortably with your back supported and both feet on the floor (don't cross your legs). Elevate your arm to heart level on a table or a desk. Use the proper sized cuff. It should fit smoothly and snugly around your bare upper arm. There should be enough room to slip a fingertip under the cuff. The bottom edge of the cuff should be 1 inch above the crease of the elbow. Ideally, take 3 measurements at one sitting and record the average.

## 2022-08-26 ENCOUNTER — Other Ambulatory Visit: Payer: Self-pay | Admitting: Cardiology

## 2022-09-04 ENCOUNTER — Ambulatory Visit (INDEPENDENT_AMBULATORY_CARE_PROVIDER_SITE_OTHER): Payer: Medicare HMO | Admitting: Nurse Practitioner

## 2022-09-04 VITALS — BP 136/84 | HR 66 | Temp 98.0°F | Ht 61.0 in | Wt 189.2 lb

## 2022-09-04 DIAGNOSIS — Z23 Encounter for immunization: Secondary | ICD-10-CM | POA: Diagnosis not present

## 2022-09-04 DIAGNOSIS — E785 Hyperlipidemia, unspecified: Secondary | ICD-10-CM | POA: Diagnosis not present

## 2022-09-04 DIAGNOSIS — K5900 Constipation, unspecified: Secondary | ICD-10-CM | POA: Diagnosis not present

## 2022-09-04 DIAGNOSIS — I119 Hypertensive heart disease without heart failure: Secondary | ICD-10-CM | POA: Diagnosis not present

## 2022-09-04 DIAGNOSIS — I43 Cardiomyopathy in diseases classified elsewhere: Secondary | ICD-10-CM

## 2022-09-04 MED ORDER — POLYETHYLENE GLYCOL 3350 17 GM/SCOOP PO POWD: 17.0000 g | Freq: Every day | ORAL | 1 refills | Status: DC

## 2022-09-04 NOTE — Assessment & Plan Note (Signed)
Chronic, murmur less pronounced today on physical exam.  Recommend she continue on her carvedilol and amlodipine.  Follow-up with cardiology as scheduled.

## 2022-09-04 NOTE — Assessment & Plan Note (Signed)
No red flags noted on exam today.  Has bowel sounds in all 4 quadrants, no significant tenderness on palpation recommend patient trial over-the-counter MiraLAX x3 to 5 days.  If still experiencing abdominal symptoms patient was told to notify me for further evaluation.  Patient reports understanding.

## 2022-09-04 NOTE — Progress Notes (Signed)
Established Patient Office Visit  Subjective   Patient ID: Leslie Griffin, female    DOB: 1951-10-16  Age: 71 y.o. MRN: [id number]  Chief Complaint  Patient presents with   Constipation    Patient rested for the above.  Constipation: Reports she has been constipated for about 6 days.  She reports having gone out to dinner at until a restaurant where she ate tomato sauce and started to experience severe heartburn.  That night she had pain in her abdomen and stomach and chest, she woke up the next morning and still had pain.  About 2 days ago pain finally subsided, she reports that she did pass a bowel movement 2 days ago and 1 today both of which have been small.  She has been taking over-the-counter stool softeners, she is also used an enema, and using Tums as needed for heartburn (approximately 4 times per day, has not had any Tums in about 2 days).  Reports that she is burping frequently.  Hypertensive heart disease without heart failure: Currently on carvedilol 6.25 mg twice a day, recently had amlodipine 5 mg a mouth daily added about 2 weeks ago by cardiologist.  Reports tolerating medication well.  Hyperlipidemia: Continues on rosuvastatin 10 mg daily.  Tolerating well.  Last LDL 78 which is an improvement from 166.      Review of Systems  Constitutional:  Positive for chills. Negative for fever.  Respiratory:  Negative for shortness of breath.   Cardiovascular:  Negative for chest pain.  Gastrointestinal:  Positive for abdominal pain, constipation, heartburn and nausea. Negative for diarrhea.      Objective:     BP 136/84 (BP Location: Left Arm, Patient Position: Sitting, Cuff Size: Large)   Pulse 66   Temp 98 F (36.7 C) (Oral)   Ht '5\' 1"'$  (1.549 m)   Wt 189 lb 4 oz (85.8 kg)   SpO2 96%   BMI 35.76 kg/m  BP Readings from Last 3 Encounters:  09/04/22 136/84  08/25/22 132/76  07/29/22 (!) 138/90   Wt Readings from Last 3 Encounters:  09/04/22 189 lb 4 oz (85.8 kg)   07/29/22 184 lb (83.5 kg)  06/06/22 184 lb (83.5 kg)      Physical Exam Vitals reviewed.  Constitutional:      General: She is not in acute distress.    Appearance: Normal appearance.  HENT:     Head: Normocephalic and atraumatic.  Neck:     Vascular: No carotid bruit.  Cardiovascular:     Rate and Rhythm: Normal rate and regular rhythm.     Pulses: Normal pulses.     Heart sounds: Normal heart sounds.  Pulmonary:     Effort: Pulmonary effort is normal.     Breath sounds: Normal breath sounds.  Abdominal:     General: Abdomen is flat. Bowel sounds are normal. There is no distension.     Palpations: Abdomen is soft. There is no mass.     Tenderness: There is no abdominal tenderness.  Skin:    General: Skin is warm and dry.  Neurological:     General: No focal deficit present.     Mental Status: She is alert and oriented to person, place, and time.  Psychiatric:        Mood and Affect: Mood normal.        Behavior: Behavior normal.        Judgment: Judgment normal.      No results found for any  visits on 09/04/22.    The 10-year ASCVD risk score (Arnett DK, et al., 2019) is: 12.5%    Assessment & Plan:   Problem List Items Addressed This Visit       Cardiovascular and Mediastinum   Hypertensive cardiomyopathy, without heart failure (HCC)    Chronic, murmur less pronounced today on physical exam.  Recommend she continue on her carvedilol and amlodipine.  Follow-up with cardiology as scheduled.        Other   HLD (hyperlipidemia)    Chronic, significant improvement in LDL since starting rosuvastatin.  Continue taking rosuvastatin.      Constipation - Primary    No red flags noted on exam today.  Has bowel sounds in all 4 quadrants, no significant tenderness on palpation recommend patient trial over-the-counter MiraLAX x3 to 5 days.  If still experiencing abdominal symptoms patient was told to notify me for further evaluation.  Patient reports  understanding.      Relevant Medications   polyethylene glycol powder (GLYCOLAX/MIRALAX) 17 GM/SCOOP powder   Need for vaccination    Flu vaccine administered, VIS provided      Relevant Orders   Flu Vaccine QUAD High Dose(Fluad) (Completed)    Return in about 6 months (around 03/06/2023) for f/u with Judson Roch.    Ailene Ards, NP

## 2022-09-04 NOTE — Patient Instructions (Signed)
Call our office if still having constipation issues in the next week.

## 2022-09-04 NOTE — Assessment & Plan Note (Signed)
Flu vaccine administered, VIS provided

## 2022-09-04 NOTE — Assessment & Plan Note (Signed)
Chronic, significant improvement in LDL since starting rosuvastatin.  Continue taking rosuvastatin.

## 2022-09-15 ENCOUNTER — Encounter: Payer: Self-pay | Admitting: Pharmacist Clinician (PhC)/ Clinical Pharmacy Specialist

## 2022-09-18 ENCOUNTER — Encounter: Payer: Self-pay | Admitting: Cardiology

## 2022-09-18 ENCOUNTER — Other Ambulatory Visit: Payer: Self-pay | Admitting: Pharmacist Clinician (PhC)/ Clinical Pharmacy Specialist

## 2022-09-18 MED ORDER — AMLODIPINE BESYLATE 10 MG PO TABS: 10.0000 mg | ORAL_TABLET | Freq: Every day | ORAL | 3 refills | Status: DC

## 2022-09-21 ENCOUNTER — Other Ambulatory Visit: Payer: Self-pay | Admitting: Family

## 2022-09-24 ENCOUNTER — Other Ambulatory Visit: Payer: Self-pay | Admitting: Cardiology

## 2022-10-05 NOTE — Progress Notes (Unsigned)
10/06/2022 Leslie Griffin 06-16-1951 [id number]   HPI:  Leslie Griffin is a 71 y.o. female patient of Dr Gardiner Rhyme, with a PMH below who presents today for hypertension clinic evaluation.  She was seen by Dr. Gardiner Rhyme last month and because of recent diagnosis of cardiomyopathy with LVOT obstruction, he stopped her spironolactone and instead started carvedilol 6.25 mg bid.  Will prefer to avoid diuretics at this time.    Today she brings a list of home readings, as well has her home BP device.  She notes that when she will often check her pressure 2 or 3 times over a 10 minute period and the readings often drop by 10-20 points the longer she sits.  She also complains of ongoing chest pain - a dull ache/pressure that doesn't seem to ever go away, topped with stabbing chest pains that occur after meals.  And she is constantly tired, although this did not change with any significance since starting the carvedilol, and she does have a diagnosis of fibromyalgia.    At her last visit I added amlodipine 5 mg daily.   After 2 weeks pt sent note indicating pressures still elevated at home so the dose was increased to 10 mg daily.  She returns today for follow up.  She notes that since increasing the amlodipine, her chest pains have decreased significantly.  States there is still some ache/pressure, but states pain level dropped from 3-4 to 1 or sometimes 2.  No more increased pressure after eating.     129/82 73 Omron 3 series 113/73 71  norm   Past Medical History: Hypertrophic cardiomyopathy Asymmetric hypertrophy of septum  hyperlipidemia 10/22 LDL 166, started rosuvastatin, monitored by cards  Chest pain Trying PPI to determine if GERD related  Pre-diabetes 10/22 A1c 5.8 (down from 6 2/22).       Blood Pressure Goal:  130/80  Current Medications:   carvedilol 6.25 mg bid, amlodipine 10 mg qd  Family Hx:   both died from MI both at 35, brother had CABG x 4, son has cholesterol issues,  diabetic, pancreatitis/high trigs  Social Hx: no tobacco, occasional alcohol, coffee most days - 2-3 cups in AM      Diet: not much red meat or fried foods, mostly chicken or fish; vegetables most days ; not much of appetite  Exercise: tstarted back at [facility] recently, - hasn't been able to go 2/2 body pain - shoulder, ankle; does some morning stretches to help limber in the mornings  Home BP readings: home cuff Omron, size appropriate and accurate;      17 readings (since dose increase to 10 mg amlodipine) average 118/77 (range HR 72  Previous average (27 readings) - 143/85 (range 102-172/63-99)  HR 69    Intolerances:  no cardiac medication intolerances  Labs: 7/23:  Na 139, K 4.3, Glu 93, BUN 18, SCr 0.67, GFR 88.49   Wt Readings from Last 3 Encounters:  09/04/22 189 lb 4 oz (85.8 kg)  07/29/22 184 lb (83.5 kg)  06/06/22 184 lb (83.5 kg)   BP Readings from Last 3 Encounters:  10/06/22 113/73  09/04/22 136/84  08/25/22 132/76   Pulse Readings from Last 3 Encounters:  10/06/22 71  09/04/22 66  08/25/22 63    Current Outpatient Medications  Medication Sig Dispense Refill   amLODipine (NORVASC) 10 MG tablet Take 1 tablet (10 mg total) by mouth daily. 90 tablet 3   carvedilol (COREG) 6.25 MG tablet Take 1  tablet (6.25 mg total) by mouth 2 (two) times daily. 180 tablet 3   cholecalciferol (VITAMIN D) 25 MCG (1000 UNIT) tablet Take 1,000 Units by mouth daily.     cyanocobalamin 1000 MCG tablet Take 1 tablet by mouth daily.     Omega-3 Fatty Acids (FISH OIL) 1200 MG CAPS Take 2,400 mg by mouth daily.     omeprazole (PRILOSEC) 20 MG capsule Take 1 capsule (20 mg total) by mouth daily. Please schedule appointment for additional refills. 90 capsule 1   rosuvastatin (CRESTOR) 10 MG tablet Take 1 tablet (10 mg total) by mouth daily. 90 tablet 3   polyethylene glycol powder (GLYCOLAX/MIRALAX) 17 GM/SCOOP powder Take 17 g by mouth daily. (Patient not taking: Reported on 10/06/2022) 3350 g  1   pregabalin (LYRICA) 75 MG capsule Take 1 capsule (75 mg total) by mouth 2 (two) times daily as needed. (Patient not taking: Reported on 10/06/2022) 60 capsule 3   No current facility-administered medications for this visit.    Allergies  Allergen Reactions   Benadryl [Diphenhydramine Hcl] Shortness Of Breath and Rash   Cephalexin Shortness Of Breath and Rash    *Keflex   Duloxetine Other (See Comments)    Violent tremors    Penicillins Shortness Of Breath and Rash    Childhood Reaction    Topiramate Shortness Of Breath   Sulfa Antibiotics Rash    Past Medical History:  Diagnosis Date   Allergic rhinitis    Anxiety    Arthritis    Family history of colonic polyps    Fibromyalgia    Heart murmur    History of colonic polyps    Hypercholesterolemia    Hypertension    Mitral valve prolapse    Ocular migraine    Palpitations     Blood pressure 113/73, pulse 71.     Hypertension Patient with essential hypertension, now doing well on carvedilol and amlodipine.  Will have her continue with these and monitor home readings 3-4 times each week.  She should reach out via My Chart should those readings increase > 130/80 with any consistency.  She is scheduled to see Dr. Gardiner Rhyme in March and we can follow up with her as needed.     Tommy Medal PharmD CPP [facility] [address] [place], [place] [address] [phone]

## 2022-10-06 ENCOUNTER — Ambulatory Visit: Payer: Medicare HMO | Attending: Cardiology | Admitting: Pharmacist Clinician (PhC)/ Clinical Pharmacy Specialist

## 2022-10-06 ENCOUNTER — Encounter: Payer: Self-pay | Admitting: Pharmacist Clinician (PhC)/ Clinical Pharmacy Specialist

## 2022-10-06 VITALS — BP 113/73 | HR 71

## 2022-10-06 DIAGNOSIS — I1A0 Resistant hypertension: Secondary | ICD-10-CM | POA: Diagnosis not present

## 2022-10-06 NOTE — Assessment & Plan Note (Signed)
Patient with essential hypertension, now doing well on carvedilol and amlodipine.  Will have her continue with these and monitor home readings 3-4 times each week.  She should reach out via My Chart should those readings increase > 130/80 with any consistency.  She is scheduled to see Dr. Gardiner Rhyme in March and we can follow up with her as needed.

## 2022-10-06 NOTE — Patient Instructions (Signed)
Return for a a follow up appointment with Dr. Gardiner Rhyme in March  Check your blood pressure at home 3-4 times each week and keep record of the readings.  Take your BP meds as follows:  Continue with your current medications  Bring all of your meds, your BP cuff and your record of home blood pressures to your next appointment.  Exercise as you're able, try to walk approximately 30 minutes per day.  Keep salt intake to a minimum, especially watch canned and prepared boxed foods.  Eat more fresh fruits and vegetables and fewer canned items.  Avoid eating in fast food restaurants.    HOW TO TAKE YOUR BLOOD PRESSURE: Rest 5 minutes before taking your blood pressure.  Don't smoke or drink caffeinated beverages for at least 30 minutes before. Take your blood pressure before (not after) you eat. Sit comfortably with your back supported and both feet on the floor (don't cross your legs). Elevate your arm to heart level on a table or a desk. Use the proper sized cuff. It should fit smoothly and snugly around your bare upper arm. There should be enough room to slip a fingertip under the cuff. The bottom edge of the cuff should be 1 inch above the crease of the elbow. Ideally, take 3 measurements at one sitting and record the average.

## 2022-10-22 ENCOUNTER — Encounter: Payer: Self-pay | Admitting: Orthopedic Surgery

## 2022-10-23 NOTE — Telephone Encounter (Signed)
No I have it, I have to reopen the spot, I put a hold on it. I will take care of it

## 2022-10-24 ENCOUNTER — Encounter: Payer: Self-pay | Admitting: Nurse Practitioner

## 2022-10-27 ENCOUNTER — Ambulatory Visit (INDEPENDENT_AMBULATORY_CARE_PROVIDER_SITE_OTHER): Payer: Medicare HMO

## 2022-10-27 ENCOUNTER — Ambulatory Visit (INDEPENDENT_AMBULATORY_CARE_PROVIDER_SITE_OTHER): Payer: Medicare HMO | Admitting: Orthopedic Surgery

## 2022-10-27 ENCOUNTER — Encounter: Payer: Self-pay | Admitting: Orthopedic Surgery

## 2022-10-27 DIAGNOSIS — M79672 Pain in left foot: Secondary | ICD-10-CM

## 2022-10-27 NOTE — Progress Notes (Signed)
Office Visit Note   Patient: Leslie Griffin           Date of Birth: 05-25-1951           MRN: [id number] Visit Date: 10/27/2022              Requested by: Ailene Ards, NP [place],  [place] [id number] PCP: Ailene Ards, NP  Chief Complaint  Patient presents with   Left Ankle - Follow-up      HPI: Patient is a 71 year old woman who presents with multiple arthritic issues.  She has pain in the PIP and DIP joints of her hands she has chronic lower back pain and is having pain in the foot and ankle.  She is 1 year out from a talonavicular and subtalar fusion on the left.  Patient states there feels like there is a rubber band swelling around her ankle.  Assessment & Plan: Visit Diagnoses:  1. Pain in left foot     Plan: Recommended resume using her compression stockings she was given instructions for ankle range of motion and fascial strengthening.  Follow-Up Instructions: Return if symptoms worsen or fail to improve.   Ortho Exam  Patient is alert, oriented, no adenopathy, well-dressed, normal affect, normal respiratory effort. Examination patient is a good dorsalis pedis pulse she does have venous swelling in the left foot and ankle.  She has good dorsiflexion of the ankle.  The medial column is straight.  She has bony spurs in the DIP joints of both hands no ulnar deviation of the MCP joints.  Patient also has chronic rotator cuff insufficiency on the left.  She states her back pain comes and goes no radicular pain today.  There is no tenderness to palpation over the ankle.  There is no redness no cellulitis.  Imaging: XR Ankle Complete Left  Result Date: 10/27/2022 Three-view radiographs of the left ankle shows degenerative arthritic changes with subchondral or cystic changes.  XR Foot Complete Left  Result Date: 10/27/2022 Three-view radiographs of the left foot shows stable fusion of the subtalar and talonavicular joint.  The medial column is aligned.   No images are attached to the encounter.  Labs: Lab Results  Component Value Date   HGBA1C 5.8 08/22/2021     Lab Results  Component Value Date   ALBUMIN 4.5 06/06/2022   ALBUMIN 4.4 08/22/2021   ALBUMIN 4.4 10/26/2019    No results found for: "MG" No results found for: "VD25OH"  No results found for: "PREALBUMIN"    Latest Ref Rng & Units 04/11/2022   11:36 AM 10/29/2021    9:41 AM 08/22/2021    9:21 AM  CBC EXTENDED  WBC 4.0 - 10.5 K/uL 8.5  5.5  4.6   RBC 3.87 - 5.11 MIL/uL 4.96  4.45  4.50   Hemoglobin 12.0 - 15.0 g/dL 14.9  13.4  13.7   HCT 36.0 - 46.0 % 45.7  41.7  40.9   Platelets 150 - 400 K/uL 243  210  190.0   NEUT# 1.4 - 7.7 K/uL   2.1   Lymph# 0.7 - 4.0 K/uL   1.8      There is no height or weight on file to calculate BMI.  Orders:  Orders Placed This Encounter  Procedures   XR Foot Complete Left   XR Ankle Complete Left   No orders of the defined types were placed in this encounter.    Procedures: No procedures performed  Clinical Data: No additional findings.  ROS:  All other systems negative, except as noted in the HPI. Review of Systems  Objective: Vital Signs: There were no vitals taken for this visit.  Specialty Comments:  No specialty comments available.  PMFS History: Patient Active Problem List   Diagnosis Date Noted   Constipation 09/04/2022   Need for vaccination 09/04/2022   Primary osteoarthritis of left shoulder 05/13/2022   Prediabetes 05/01/2022   Murmur 04/17/2022   Posterior tibial tendinitis, left leg    HLD (hyperlipidemia) 08/22/2021   Visual snow syndrome 12/30/2020   Arthropathy of lumbar facet joint 11/12/2020   Chest pain 06/14/2020   Dissociative episodes 06/14/2020   Laceration of right index finger without foreign body without damage to nail 04/03/2020   Multiple drug allergies 04/03/2020   B12 deficiency 03/13/2020   Generalized abdominal pain 03/13/2020   Low serum iron 10/03/2019   Low  vitamin D level 10/03/2019   Ocular migraine 10/03/2019   Familial hypercholesterolemia 06/27/2019   Upper GI bleed 06/27/2019   Right foot pain 03/27/2019   Generalized pain 10/08/2018   Hypertension 96/02/5408   Diastolic dysfunction, left ventricle 08/18/2018   Family history of macular degeneration 08/18/2018   GERD (gastroesophageal reflux disease) 08/18/2018   History of cataract surgery 08/18/2018   History of migraine 08/18/2018   Hypertensive cardiomyopathy, without heart failure ([place]) 08/18/2018   Medication refill 08/18/2018   Mixed dyslipidemia 08/18/2018   Obesity, Class II, BMI 35-39.9 08/18/2018   Wellness examination 08/18/2018   Cardiomyopathy ([place]) 02/03/2018   Pain and swelling of ankle, right 01/27/2018   Left-sided low back pain without sciatica 12/04/2017   Fatigue 12/04/2017   Anxiety 11/04/2017   Arthralgia 11/04/2017   History of attempted suicide 11/04/2017   Insomnia 11/04/2017   Moderate episode of recurrent major depressive disorder ([place]) 11/04/2017   Past Medical History:  Diagnosis Date   Allergic rhinitis    Anxiety    Arthritis    Family history of colonic polyps    Fibromyalgia    Heart murmur    History of colonic polyps    Hypercholesterolemia    Hypertension    Mitral valve prolapse    Ocular migraine    Palpitations     Family History  Problem Relation Age of Onset   Coronary artery disease Other    Heart disease Mother    Heart disease Father    Colon polyps Sister    Other Sister        low hemoglobin   Colon cancer Neg Hx    Esophageal cancer Neg Hx    Rectal cancer Neg Hx    Stomach cancer Neg Hx     Past Surgical History:  Procedure Laterality Date   ABDOMINAL HYSTERECTOMY     age was in her 12s   COLONOSCOPY  10/05/2017   Mild sigmoid diverticulosis. Otherwise normal colonoscopy.    cyst removal arm Left 1981   from cat scratch fever   EYE SURGERY     bilateral cataract removal   FOOT ARTHRODESIS Left  11/01/2021   Procedure: LEFT TALONAVICULAR AND SUBTALAR FUSION;  Surgeon: Newt Minion, MD;  Location: [facility];  Service: Orthopedics;  Laterality: Left;   Social History   Occupational History   Not on file  Tobacco Use   Smoking status: Never   Smokeless tobacco: Never  Vaping Use   Vaping Use: Never used  Substance and Sexual Activity   Alcohol use: Yes  Comment: ocassionally    Drug use: No   Sexual activity: Not on file

## 2022-10-28 ENCOUNTER — Encounter: Payer: Self-pay | Admitting: Nurse Practitioner

## 2022-10-28 ENCOUNTER — Ambulatory Visit (INDEPENDENT_AMBULATORY_CARE_PROVIDER_SITE_OTHER): Payer: Medicare HMO | Admitting: Nurse Practitioner

## 2022-10-28 VITALS — BP 118/78 | HR 71 | Temp 98.0°F | Ht 61.0 in | Wt 190.4 lb

## 2022-10-28 DIAGNOSIS — M797 Fibromyalgia: Secondary | ICD-10-CM

## 2022-10-28 DIAGNOSIS — M1990 Unspecified osteoarthritis, unspecified site: Secondary | ICD-10-CM

## 2022-10-28 DIAGNOSIS — R5383 Other fatigue: Secondary | ICD-10-CM | POA: Diagnosis not present

## 2022-10-28 LAB — BASIC METABOLIC PANEL
BUN: 15 mg/dL (ref 6–23)
CO2: 29 mEq/L (ref 19–32)
Calcium: 9.7 mg/dL (ref 8.4–10.5)
Chloride: 103 mEq/L (ref 96–112)
Creatinine, Ser: 0.56 mg/dL (ref 0.40–1.20)
GFR: 92.14 mL/min (ref >60.00)
Glucose, Bld: 103 mg/dL — ABNORMAL HIGH (ref 70–99)
Potassium: 3.5 mEq/L (ref 3.5–5.1)
Sodium: 141 mEq/L (ref 135–145)

## 2022-10-28 NOTE — Progress Notes (Signed)
Established Patient Office Visit  Subjective   Patient ID: Leslie Griffin, female    DOB: 11-Jun-1951  Age: 71 y.o. MRN: [id number]  Chief Complaint  Patient presents with   Pain    Pain everywhere. Can't sleep because of the pain. Hot/cold flashes all through the day.    Patient continuing to have severe pain in her bilateral ankles, bilateral shoulders, back, reports "a" is in pain.  Does have history of fibromyalgia which she was diagnosed with about 30 years ago.  Also told by orthopedics that her ankle pain is related to osteoarthritis.  She has tried and failed steroids, ibuprofen, hydrocodone, tramadol, bedbound, gabapentin, unclear if she has tried muscle relaxers.  She feels that she may be in an acute fibromyalgia flare and is concerned regarding pain management.  She even took a COVID test at home to see if her muscle aches and pains were related to COVID which was negative.    Review of Systems  Constitutional:  Positive for malaise/fatigue.  Musculoskeletal:  Positive for joint pain and myalgias.      Objective:     BP 118/78 (BP Location: Left Arm, Patient Position: Sitting, Cuff Size: Large)   Pulse 71   Temp 98 F (36.7 C) (Oral)   Ht '5\' 1"'$  (1.549 m)   Wt 190 lb 6.4 oz (86.4 kg)   SpO2 97%   BMI 35.98 kg/m    Physical Exam Vitals reviewed.  Constitutional:      General: She is not in acute distress.    Appearance: Normal appearance.  HENT:     Head: Normocephalic and atraumatic.  Neck:     Vascular: No carotid bruit.  Cardiovascular:     Rate and Rhythm: Normal rate and regular rhythm.     Pulses: Normal pulses.     Heart sounds: Murmur heard.  Pulmonary:     Effort: Pulmonary effort is normal.     Breath sounds: Normal breath sounds.  Skin:    General: Skin is warm and dry.  Neurological:     General: No focal deficit present.     Mental Status: She is alert and oriented to person, place, and time.  Psychiatric:        Mood and Affect: Mood  normal.        Behavior: Behavior normal.        Judgment: Judgment normal.      No results found for any visits on 10/28/22.    The 10-year ASCVD risk score (Arnett DK, et al., 2019) is: 9.6%    Assessment & Plan:   Problem List Items Addressed This Visit       Musculoskeletal and Integument   Osteoarthritis    Chronic, reports ibuprofen does help symptoms at times.  For now recommend she use ibuprofen 600 mg by mouth every 8 hours as needed for pain regiment.  She is also encouraged to use Tylenol in between doses of ibuprofen to assist with pain management as well.      Relevant Medications   acetaminophen (TYLENOL) 500 MG tablet   ibuprofen (ADVIL) 600 MG tablet   Other Relevant Orders   Ambulatory referral to Physical Medicine Rehab     Other   Fatigue    Chronic, most likely related to far myalgia.  However we will order autoimmune antibodies for further evaluation today.  Further recommendations may be made based upon his results.      Relevant Orders   Cyclic citrul peptide  antibody, IgG ([facility])   Rheumatoid Factor   Antinuclear Antib (ANA)   Basic metabolic panel   Fibromyalgia - Primary    Chronic, not well-controlled.  Referral to physical medicine rehab made today for assistance with managing.  For now patient will use ibuprofen and Tylenol as needed for pain management.      Relevant Medications   acetaminophen (TYLENOL) 500 MG tablet   ibuprofen (ADVIL) 600 MG tablet   Other Relevant Orders   Ambulatory referral to Physical Medicine Rehab    Return for F/U as scheduled or sooner as needed.    Ailene Ards, NP

## 2022-10-28 NOTE — Assessment & Plan Note (Signed)
Chronic, reports ibuprofen does help symptoms at times.  For now recommend she use ibuprofen 600 mg by mouth every 8 hours as needed for pain regiment.  She is also encouraged to use Tylenol in between doses of ibuprofen to assist with pain management as well.

## 2022-10-28 NOTE — Assessment & Plan Note (Signed)
Chronic, not well-controlled.  Referral to physical medicine rehab made today for assistance with managing.  For now patient will use ibuprofen and Tylenol as needed for pain management.

## 2022-10-28 NOTE — Assessment & Plan Note (Signed)
Chronic, most likely related to far myalgia.  However we will order autoimmune antibodies for further evaluation today.  Further recommendations may be made based upon his results.

## 2022-10-28 NOTE — Patient Instructions (Addendum)
Do not exceed '3000mg'$  of tylenol in 24 hour period, do not exceed '3200mg'$  of ibuprofen in a 24 hour period.

## 2022-10-30 ENCOUNTER — Encounter: Payer: Self-pay | Admitting: Physical Medicine & Rehabilitation

## 2022-10-30 LAB — CYCLIC CITRUL PEPTIDE ANTIBODY, IGG: Cyclic Citrullin Peptide Ab: <16 UNITS

## 2022-10-30 LAB — ANA: Anti Nuclear Antibody (ANA): NEGATIVE

## 2022-10-30 LAB — RHEUMATOID FACTOR: Rheumatoid fact SerPl-aCnc: <14 IU/mL (ref <14)

## 2022-11-13 ENCOUNTER — Ambulatory Visit: Payer: Medicare HMO | Admitting: Nurse Practitioner

## 2022-11-21 ENCOUNTER — Encounter: Payer: Self-pay | Admitting: Nurse Practitioner

## 2022-12-04 ENCOUNTER — Telehealth (INDEPENDENT_AMBULATORY_CARE_PROVIDER_SITE_OTHER): Payer: Medicare HMO | Admitting: Nurse Practitioner

## 2022-12-04 VITALS — BP 108/73 | HR 85 | Ht 61.0 in | Wt 180.0 lb

## 2022-12-04 DIAGNOSIS — J329 Chronic sinusitis, unspecified: Secondary | ICD-10-CM | POA: Diagnosis not present

## 2022-12-04 MED ORDER — DOXYCYCLINE HYCLATE 100 MG PO TABS: 100.0000 mg | ORAL_TABLET | Freq: Two times a day (BID) | ORAL | 0 refills | Status: DC

## 2022-12-04 NOTE — Assessment & Plan Note (Signed)
Acute, concern for material etiology as symptoms have been longer than 2 weeks.  Prescribed parts of doxycycline, patient encouraged to let us know if symptoms persist or worsen into next week.  Patient reports understanding.  Continue over-the-counter cold and flu medication for symptom management.

## 2022-12-04 NOTE — Progress Notes (Signed)
   Established Patient Office Visit  An audio/visual tele-health visit was completed today for this patient. I connected with  [name] on 12/04/22 utilizing audio/visual technology and verified that I am speaking with the correct person using two identifiers. The patient was located at their home, and I was located at the office of [facility] at [facility] during the encounter. I discussed the limitations of evaluation and management by telemedicine. The patient expressed understanding and agreed to proceed.     Subjective   Patient ID: Leslie Griffin, female    DOB: 1951-05-08  Age: 72 y.o. MRN: [id number]  Chief Complaint  Patient presents with   Follow-up    Been sick for two weeks, taking over counter meds which is not helping Having chills, coughing and body aches Tested negative for covid during early symptoms     Symptom onset 2 weeks ago. Has tested for covid 19 at home 4-5 days after symptom onset and it was negative. Using over the counter cold and flu day and night.    Review of Systems  Constitutional:  Positive for chills and malaise/fatigue. Negative for fever.  HENT:  Positive for congestion and sore throat (mild).        (+) postnasal drip  Respiratory:  Positive for cough and shortness of breath (exertional). Negative for sputum production.   Cardiovascular:  Positive for chest pain.  Gastrointestinal:  Negative for diarrhea and vomiting.  Neurological:  Positive for headaches.      Objective:     BP 108/73   Pulse 85   Ht '5\' 1"'$  (1.549 m)   Wt 180 lb (81.6 kg)   BMI 34.01 kg/m  BP Readings from Last 3 Encounters:  12/04/22 108/73  10/28/22 118/78  10/06/22 113/73   Wt Readings from Last 3 Encounters:  12/04/22 180 lb (81.6 kg)  10/28/22 190 lb 6.4 oz (86.4 kg)  09/04/22 189 lb 4 oz (85.8 kg)      Physical Exam Comprehensive physical exam not completed today as office visit was conducted remotely.  Appears well over the phone, no  signs of acute respiratory distress.  Patient was alert and oriented, and appeared to have appropriate judgment.   No results found for any visits on 12/04/22.    The 10-year ASCVD risk score (Arnett DK, et al., 2019) is: 9.2%    Assessment & Plan:   Problem List Items Addressed This Visit       Respiratory   Sinusitis - Primary    Acute, concern for material etiology as symptoms have been longer than 2 weeks.  Prescribed parts of doxycycline, patient encouraged to let us know if symptoms persist or worsen into next week.  Patient reports understanding.  Continue over-the-counter cold and flu medication for symptom management.      Relevant Medications   doxycycline (VIBRA-TABS) 100 MG tablet    Return if symptoms worsen or fail to improve.    Ailene Ards, NP

## 2022-12-12 ENCOUNTER — Ambulatory Visit (INDEPENDENT_AMBULATORY_CARE_PROVIDER_SITE_OTHER): Payer: Medicare HMO | Admitting: Nurse Practitioner

## 2022-12-12 ENCOUNTER — Ambulatory Visit (INDEPENDENT_AMBULATORY_CARE_PROVIDER_SITE_OTHER): Payer: Medicare HMO

## 2022-12-12 ENCOUNTER — Encounter: Payer: Medicare HMO | Admitting: Physical Medicine & Rehabilitation

## 2022-12-12 VITALS — BP 100/62 | HR 80 | Temp 97.7°F | Ht 61.0 in | Wt 187.4 lb

## 2022-12-12 DIAGNOSIS — J189 Pneumonia, unspecified organism: Secondary | ICD-10-CM | POA: Insufficient documentation

## 2022-12-12 DIAGNOSIS — R079 Chest pain, unspecified: Secondary | ICD-10-CM | POA: Diagnosis not present

## 2022-12-12 DIAGNOSIS — J9811 Atelectasis: Secondary | ICD-10-CM | POA: Diagnosis not present

## 2022-12-12 DIAGNOSIS — R059 Cough, unspecified: Secondary | ICD-10-CM | POA: Diagnosis not present

## 2022-12-12 LAB — POCT RAPID STREP A (OFFICE): Rapid Strep A Screen: NEGATIVE

## 2022-12-12 LAB — BASIC METABOLIC PANEL
BUN: 23 mg/dL (ref 6–23)
CO2: 28 mEq/L (ref 19–32)
Calcium: 9.5 mg/dL (ref 8.4–10.5)
Chloride: 103 mEq/L (ref 96–112)
Creatinine, Ser: 0.55 mg/dL (ref 0.40–1.20)
GFR: 92.46 mL/min (ref >60.00)
Glucose, Bld: 91 mg/dL (ref 70–99)
Potassium: 3.8 mEq/L (ref 3.5–5.1)
Sodium: 141 mEq/L (ref 135–145)

## 2022-12-12 LAB — POCT INFLUENZA A/B
Influenza A, POC: NEGATIVE
Influenza B, POC: NEGATIVE

## 2022-12-12 LAB — CBC
HCT: 38.7 % (ref 36.0–46.0)
Hemoglobin: 13 g/dL (ref 12.0–15.0)
MCHC: 33.5 g/dL (ref 30.0–36.0)
MCV: 89.6 fl (ref 78.0–100.0)
Platelets: 237 10*3/uL (ref 150.0–400.0)
RBC: 4.32 Mil/uL (ref 3.87–5.11)
RDW: 13.9 % (ref 11.5–15.5)
WBC: 6.1 10*3/uL (ref 4.0–10.5)

## 2022-12-12 LAB — POCT RESPIRATORY SYNCYTIAL VIRUS: RSV Rapid Ag: NEGATIVE

## 2022-12-12 LAB — TSH: TSH: 2.29 u[IU]/mL (ref 0.35–5.50)

## 2022-12-12 MED ORDER — LEVOFLOXACIN 750 MG PO TABS: 750.0000 mg | ORAL_TABLET | Freq: Every day | ORAL | 0 refills | Status: DC

## 2022-12-12 MED ORDER — ALBUTEROL SULFATE HFA 108 (90 BASE) MCG/ACT IN AERS: 2.0000 | INHALATION_SPRAY | Freq: Four times a day (QID) | RESPIRATORY_TRACT | 0 refills | Status: DC | PRN

## 2022-12-12 NOTE — Progress Notes (Signed)
Established Patient Office Visit  Subjective   Patient ID: Leslie Griffin, female    DOB: [birth year]  Age: 72 y.o. MRN: [id number]  Chief Complaint  Patient presents with   Cough   Symptoms present for 3 weeks.  She continues to report significant fatigue, chills, when checking temperature no fever, does have congestion with postnasal drip, cough, shortness of breath, wheezing, and headaches.  Has not tested negative for COVID, RSV, flu.  Is still on a course of doxycycline day 7 out of 10.  She also mentions intermittent chest pain.     Review of Systems  Constitutional:  Positive for chills. Negative for fever.  HENT:  Positive for congestion.        (+) post nasal drip  Respiratory:  Positive for cough, shortness of breath and wheezing.   Cardiovascular:  Positive for chest pain. Negative for orthopnea, leg swelling and PND.  Neurological:  Positive for headaches.      Objective:     BP 100/62   Pulse 80   Temp 97.7 F (36.5 C) (Temporal)   Ht '5\' 1"'$  (1.549 m)   Wt 187 lb 6 oz (85 kg)   SpO2 97%   BMI 35.40 kg/m  BP Readings from Last 3 Encounters:  12/12/22 100/62  12/04/22 108/73  10/28/22 118/78   Wt Readings from Last 3 Encounters:  12/12/22 187 lb 6 oz (85 kg)  12/04/22 180 lb (81.6 kg)  10/28/22 190 lb 6.4 oz (86.4 kg)      Physical Exam Vitals reviewed.  Constitutional:      General: She is not in acute distress.    Appearance: Normal appearance.  HENT:     Head: Normocephalic and atraumatic.  Neck:     Vascular: No carotid bruit.  Cardiovascular:     Rate and Rhythm: Normal rate and regular rhythm.     Pulses: Normal pulses.     Heart sounds: Murmur heard.  Pulmonary:     Effort: Pulmonary effort is normal.     Breath sounds: Examination of the right-lower field reveals rhonchi. Rhonchi present. No wheezing.  Musculoskeletal:     Right lower leg: No edema.     Left lower leg: No edema.  Skin:    General: Skin is warm and dry.   Neurological:     General: No focal deficit present.     Mental Status: She is alert and oriented to person, place, and time.  Psychiatric:        Mood and Affect: Mood normal.        Behavior: Behavior normal.        Judgment: Judgment normal.      No results found for any visits on 12/12/22.    The 10-year ASCVD risk score (Arnett DK, et al., 2019) is: 7.9%    Assessment & Plan:   Problem List Items Addressed This Visit       Respiratory   Community acquired pneumonia - Primary    Concern for pneumonia.  Patient instructed to continue doxycycline, will also add albuterol inhaler and refer to pulmonology.  Consider course of levofloxacin if after completing doxycycline course she still feels unwell.  Vital signs stable today, oxygen 97% on room air.  Will collect chest x-ray today as well.  Referral to pulmonology in the event symptoms continue to persist despite this treatment plan, if symptoms resolve she can cancel pulmonology appointment.  Will also order labs for further evaluation.  Further recommendations may  be made based upon these results.      Relevant Medications   albuterol (VENTOLIN HFA) 108 (90 Base) MCG/ACT inhaler   levofloxacin (LEVAQUIN) 750 MG tablet   Other Relevant Orders   DG Chest 2 View   CBC   Basic metabolic panel   TSH   Ambulatory referral to Pulmonology   POCT respiratory syncytial virus   POCT rapid strep A   POCT Influenza A/B     Other   Chest pain    Intermittent, EKG collected today appears unchanged from last 6 months ago.  No additional workup recommended today.      Relevant Orders   EKG 12-Lead    Return for As scheduled.    Ailene Ards, NP

## 2022-12-12 NOTE — Assessment & Plan Note (Signed)
Intermittent, EKG collected today appears unchanged from last 6 months ago.  No additional workup recommended today.

## 2022-12-12 NOTE — Patient Instructions (Signed)
Finish doxycycline. If on Tuesday you are still feeling unwell start the levofloxacin. Take 1 tablet of levofloxacin by mouth once a day. If you start to experience frequent watery/mucusy diarrhea or worsening joint pain while on levofloxacin stop it and go to the emergency department. Will refer you to pulmonologist in the event your symptoms continue to persist. If they resolve you do not need to go to the pulmonologist. Will notify you of your lab results once I get them back.

## 2022-12-12 NOTE — Assessment & Plan Note (Signed)
Concern for pneumonia.  Patient instructed to continue doxycycline, will also add albuterol inhaler and refer to pulmonology.  Consider course of levofloxacin if after completing doxycycline course she still feels unwell.  Vital signs stable today, oxygen 97% on room air.  Will collect chest x-ray today as well.  Referral to pulmonology in the event symptoms continue to persist despite this treatment plan, if symptoms resolve she can cancel pulmonology appointment.  Will also order labs for further evaluation.  Further recommendations may be made based upon these results.

## 2022-12-17 ENCOUNTER — Encounter: Payer: Self-pay | Admitting: Pulmonary Disease

## 2022-12-17 ENCOUNTER — Ambulatory Visit: Payer: Medicare HMO | Admitting: Pulmonary Disease

## 2022-12-17 VITALS — BP 128/70 | HR 72 | Wt 182.0 lb

## 2022-12-17 DIAGNOSIS — J40 Bronchitis, not specified as acute or chronic: Secondary | ICD-10-CM

## 2022-12-17 MED ORDER — BUDESONIDE-FORMOTEROL FUMARATE 160-4.5 MCG/ACT IN AERO: 2.0000 | INHALATION_SPRAY | Freq: Two times a day (BID) | RESPIRATORY_TRACT | 5 refills | Status: DC

## 2022-12-17 NOTE — Patient Instructions (Signed)
Nice to meet you  Use Symbicort 2 puff twice a day - rinse mouth with water and spit after every use  If too expensive, please let me know. And we will look for a cost effective solution.  Return to clinic in 6 weeks, or sooner as needed

## 2022-12-28 DIAGNOSIS — Z880 Allergy status to penicillin: Secondary | ICD-10-CM | POA: Diagnosis not present

## 2022-12-28 DIAGNOSIS — M1990 Unspecified osteoarthritis, unspecified site: Secondary | ICD-10-CM | POA: Diagnosis not present

## 2022-12-28 DIAGNOSIS — R269 Unspecified abnormalities of gait and mobility: Secondary | ICD-10-CM | POA: Diagnosis not present

## 2022-12-28 DIAGNOSIS — E785 Hyperlipidemia, unspecified: Secondary | ICD-10-CM | POA: Diagnosis not present

## 2022-12-28 DIAGNOSIS — I10 Essential (primary) hypertension: Secondary | ICD-10-CM | POA: Diagnosis not present

## 2022-12-28 DIAGNOSIS — Z881 Allergy status to other antibiotic agents status: Secondary | ICD-10-CM | POA: Diagnosis not present

## 2022-12-28 DIAGNOSIS — Z882 Allergy status to sulfonamides status: Secondary | ICD-10-CM | POA: Diagnosis not present

## 2022-12-28 DIAGNOSIS — E669 Obesity, unspecified: Secondary | ICD-10-CM | POA: Diagnosis not present

## 2022-12-28 DIAGNOSIS — Z6832 Body mass index (BMI) 32.0-32.9, adult: Secondary | ICD-10-CM | POA: Diagnosis not present

## 2022-12-28 DIAGNOSIS — Z8249 Family history of ischemic heart disease and other diseases of the circulatory system: Secondary | ICD-10-CM | POA: Diagnosis not present

## 2022-12-28 DIAGNOSIS — R69 Illness, unspecified: Secondary | ICD-10-CM | POA: Diagnosis not present

## 2022-12-28 DIAGNOSIS — K219 Gastro-esophageal reflux disease without esophagitis: Secondary | ICD-10-CM | POA: Diagnosis not present

## 2022-12-29 ENCOUNTER — Encounter: Payer: Self-pay | Admitting: Nurse Practitioner

## 2023-01-06 ENCOUNTER — Encounter: Payer: Self-pay | Admitting: Nurse Practitioner

## 2023-01-06 NOTE — Telephone Encounter (Signed)
Leslie Griffin is out of the office until Thursday pls advise on email.Marland KitchenJohny Chess

## 2023-01-07 IMAGING — DX DG CHEST 2V
2 series · 2 of 2 positions shown · non-contrast
Comparison: April 15, 2014

CLINICAL DATA: cp

EXAM:
CHEST - 2 VIEW

[chest ap]
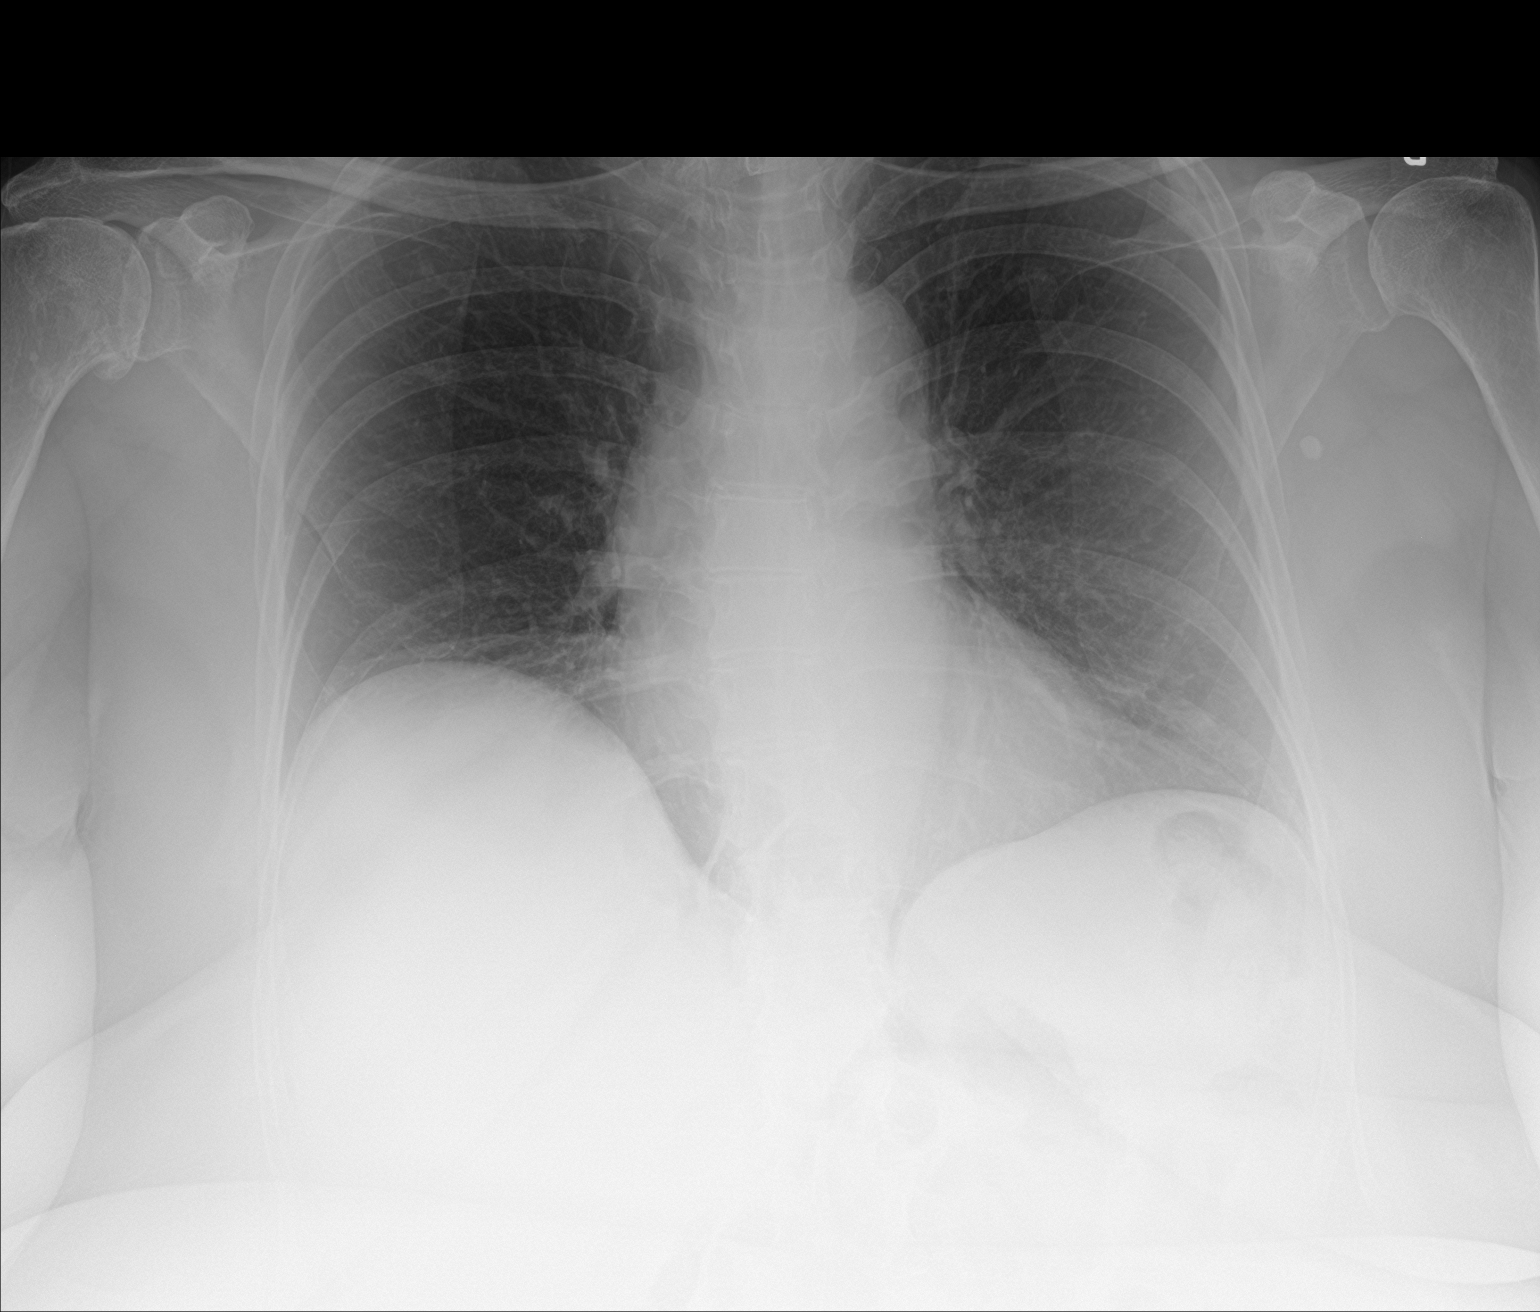

[chest lat]
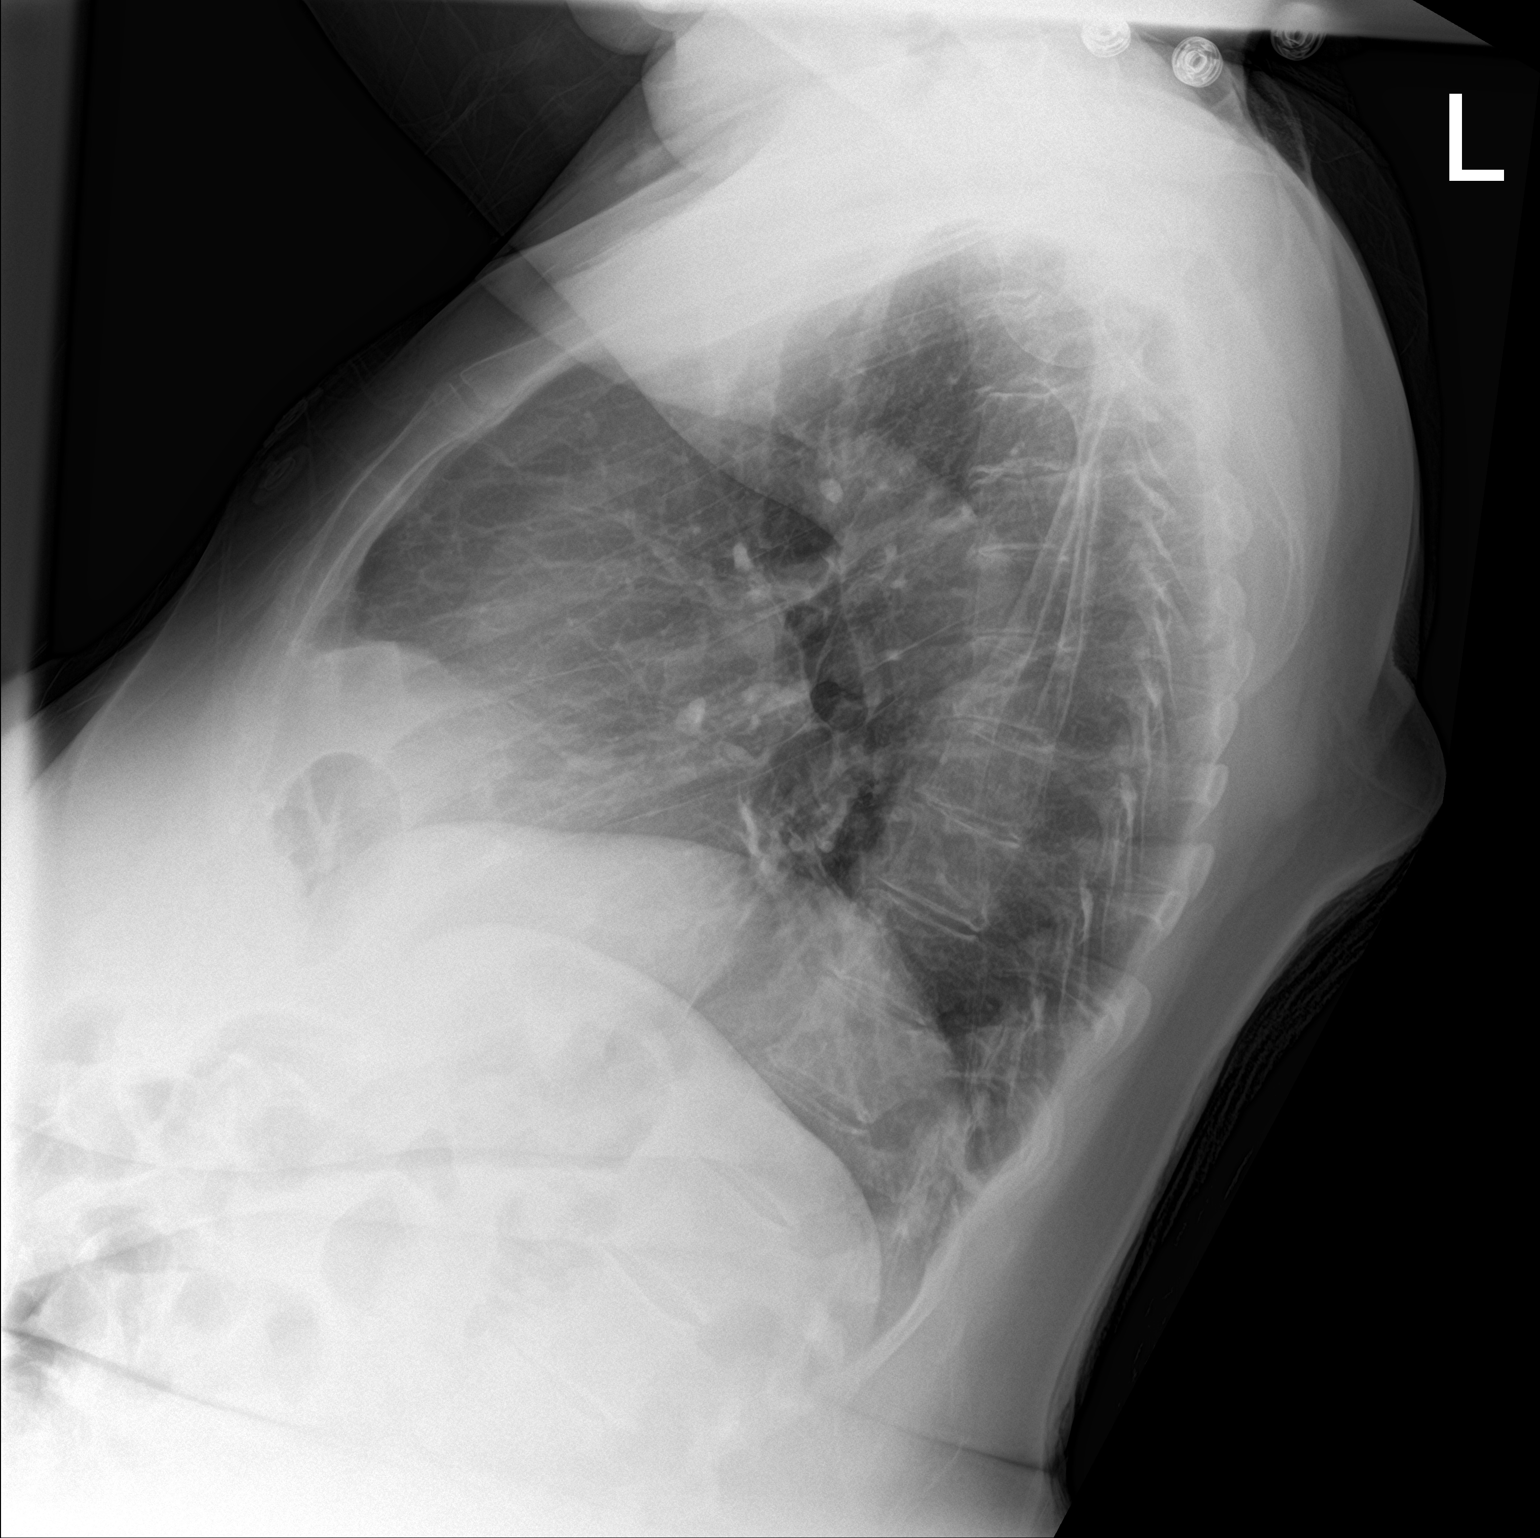

[2 of 2 positions shown; findings below may reference images not displayed]

FINDINGS: The heart size and mediastinal contours are within normal limits.
Both lungs are clear. Again seen is the moderate elevation of the
right hemidiaphragm. The visualized skeletal structures are
unremarkable.
IMPRESSION: Elevation of the right hemidiaphragm.  Lungs are clear.

## 2023-01-09 ENCOUNTER — Encounter: Payer: Medicare HMO | Admitting: Physical Medicine & Rehabilitation

## 2023-01-21 ENCOUNTER — Telehealth: Payer: Self-pay

## 2023-01-21 NOTE — Telephone Encounter (Signed)
Patient returned call. Patient has been rescheduled with MH.   Nothing further needed.

## 2023-01-21 NOTE — Telephone Encounter (Signed)
Called and left voicemail for patient to call office back to reschedule her follow up that she has 3/13 at 8:30 per MH. He has a meeting that morning and will not be here. Two slots are held for her in the afternoon that day or he has the 15th open.

## 2023-01-25 NOTE — Progress Notes (Unsigned)
Cardiology Office Note:    Date:  01/27/2023   ID:  Leslie Griffin, DOB 01-Mar-1951, MRN [id number]  PCP:  Ailene Ards, NP  Cardiologist:  Donato Heinz, MD  Electrophysiologist:  None   Referring MD: Ailene Ards, NP   Chief Complaint  Patient presents with   Cardiomyopathy    History of Present Illness:    Leslie Griffin is a 72 y.o. female with a hx of HCM, hypertension, hyperlipidemia, fibromyalgia, MVP who presents for follow-up.  She was referred by Leslie Ruths, NP for evaluation of chest pain, initially seen on 04/28/2022.  She was seen in the ED for chest pain on 04/11/2022, work-up unremarkable.  She reports she has been having chest pain off and on since 5/26.  Feels sharp pain in the left center chest, lasts for about 30 minutes or so.  Occurs within 5 minutes of eating.  However has also noted chest pain when exerting herself.  Does report having shortness of breath.  Has also had episodes of lightheadedness but no syncope.  Reports has been having palpitations occurring during episodes as well.  No smoking history.  Family history includes both parents died from MI around age 17, brother had CABG in 83s.  Echocardiogram 05/05/2022 showed severe asymmetric hypertrophy of the septum, peak gradient across LVOT with Valsalva 20 mmHg, normal biventricular function, mild AI/MR.  Zio patch x7 days on 05/15/2022 showed no significant arrhythmias.  Coronary CTA 05/21/2022 showed calcium score 1 (40th percentile), minimal nonobstructive plaque in LAD.  Cardiac MRI 07/23/2022 showed asymmetric LV hypertrophy measuring 16 mm in basal septum (10 mm in posterior wall), meeting criteria for hypertrophic dermopathy, no LGE, normal biventricular size and systolic function, mild mitral regurgitation (regurgitant fraction 11%).  Since last clinic visit, she reports she is doing well.  States that she has been having some swelling in both ankles recently.  Also reports some lightheadedness with standing  but denies any syncope. Denies any chest pain, dyspnea, or palpitations.    Wt Readings from Last 3 Encounters:  01/27/23 190 lb 3.2 oz (86.3 kg)  12/17/22 182 lb (82.6 kg)  12/12/22 187 lb 6 oz (85 kg)      Past Medical History:  Diagnosis Date   Allergic rhinitis    Anxiety    Arthritis    Family history of colonic polyps    Fibromyalgia    Heart murmur    History of colonic polyps    Hypercholesterolemia    Hypertension    Mitral valve prolapse    Ocular migraine    Palpitations     Past Surgical History:  Procedure Laterality Date   ABDOMINAL HYSTERECTOMY     age was in her 60s   COLONOSCOPY  10/05/2017   Mild sigmoid diverticulosis. Otherwise normal colonoscopy.    cyst removal arm Left 1981   from cat scratch fever   EYE SURGERY     bilateral cataract removal   FOOT ARTHRODESIS Left 11/01/2021   Procedure: LEFT TALONAVICULAR AND SUBTALAR FUSION;  Surgeon: Newt Minion, MD;  Location: [facility];  Service: Orthopedics;  Laterality: Left;    Current Medications: Current Meds  Medication Sig   acetaminophen (TYLENOL) 500 MG tablet Take 500-1,000 mg by mouth every 8 (eight) hours as needed.   amLODipine (NORVASC) 10 MG tablet Take 1 tablet (10 mg total) by mouth daily.   budesonide-formoterol (SYMBICORT) 160-4.5 MCG/ACT inhaler Inhale 2 puffs into the lungs 2 (two) times daily.   carvedilol (  COREG) 6.25 MG tablet Take 1 tablet (6.25 mg total) by mouth 2 (two) times daily.   cholecalciferol (VITAMIN D) 25 MCG (1000 UNIT) tablet Take 1,000 Units by mouth daily.   cyanocobalamin 1000 MCG tablet Take 1 tablet by mouth daily.   Omega-3 Fatty Acids (FISH OIL) 1200 MG CAPS Take 2,400 mg by mouth daily.   omeprazole (PRILOSEC) 20 MG capsule Take 1 capsule (20 mg total) by mouth daily. Please schedule appointment for additional refills.   rosuvastatin (CRESTOR) 10 MG tablet Take 1 tablet (10 mg total) by mouth daily.     Allergies:   Benadryl [diphenhydramine hcl],  Cephalexin, Duloxetine, Penicillins, Topiramate, and Sulfa antibiotics   Social History   Socioeconomic History   Marital status: Married    Spouse name: Not on file   Number of children: 1   Years of education: Not on file   Highest education level: Not on file  Occupational History   Not on file  Tobacco Use   Smoking status: Never   Smokeless tobacco: Never  Vaping Use   Vaping Use: Never used  Substance and Sexual Activity   Alcohol use: Yes    Comment: ocassionally    Drug use: No   Sexual activity: Not on file  Other Topics Concern   Not on file  Social History Narrative   Not on file   Social Determinants of Health   Financial Resource Strain: Not on file  Food Insecurity: Not on file  Transportation Needs: Not on file  Physical Activity: Not on file  Stress: Not on file  Social Connections: Not on file     Family History: The patient's family history includes Colon polyps in her sister; Coronary artery disease in an other family member; Heart disease in her father and mother; Other in her sister. There is no history of Colon cancer, Esophageal cancer, Rectal cancer, or Stomach cancer.  ROS:   Please see the history of present illness.     All other systems reviewed and are negative.  EKGs/Labs/Other Studies Reviewed:    The following studies were reviewed today:   EKG:   04/28/2022: Normal sinus rhythm, 89, LVH, Q waves in leads III, aVF, less than 1 mm ST depressions in leads V5/6   Recent Labs: 06/06/2022: ALT 22 12/12/2022: BUN 23; Creatinine, Ser 0.55; Hemoglobin 13.0; Platelets 237.0; Potassium 3.8; Sodium 141; TSH 2.29  Recent Lipid Panel    Component Value Date/Time   CHOL 185 07/29/2022 1007   TRIG 82 07/29/2022 1007   HDL 92 07/29/2022 1007   CHOLHDL 2.0 07/29/2022 1007   CHOLHDL 2 06/06/2022 0909   VLDL 13.6 06/06/2022 0909   LDLCALC 78 07/29/2022 1007    Physical Exam:    VS:  BP 128/72   Pulse 74   Ht '5\' 1"'$  (1.549 m)   Wt 190  lb 3.2 oz (86.3 kg)   SpO2 99%   BMI 35.94 kg/m     Wt Readings from Last 3 Encounters:  01/27/23 190 lb 3.2 oz (86.3 kg)  12/17/22 182 lb (82.6 kg)  12/12/22 187 lb 6 oz (85 kg)     GEN:  in no acute distress HEENT: Normal NECK: No JVD; No carotid bruits CARDIAC: RRR, 2 out of 6 systolic murmur RESPIRATORY:  Clear to auscultation without rales, wheezing or rhonchi  ABDOMEN: Soft, non-tender, non-distended MUSCULOSKELETAL: Trace edema; No deformity  SKIN: Warm and dry NEUROLOGIC:  Alert and oriented x 3 PSYCHIATRIC:  Normal affect  ASSESSMENT:    1. Hypertrophic cardiomyopathy ([place])   2. Bilateral lower extremity edema   3. Hypertension, unspecified type   4. Hyperlipidemia, unspecified hyperlipidemia type      PLAN:    Hypertrophic cardiomyopathy: Echocardiogram 05/05/2022 showed severe asymmetric hypertrophy of the septum, peak gradient across LVOT with Valsalva 20 mmHg, normal biventricular function, mild AI/MR.  Zio patch x7 days on 05/15/2022 showed no significant arrhythmias.  Cardiac MRI 07/23/2022 showed asymmetric LV hypertrophy measuring 16 mm in basal septum (10 mm in posterior wall), meeting criteria for hypertrophic dermopathy, no LGE, normal biventricular size and systolic function, mild mitral regurgitation (regurgitant fraction 11%). -Recommend first-degree relatives be screened.  She will speak to her son -Continue carvedilol 6.25 mg twice daily  Lower extremity edema: Reports bilateral swelling in ankles.  Trace on exam today.  Suspect due to amlodipine use.  Check BNP, CMET  Chest pain: Atypical in description.  Coronary CTA 05/21/2022 showed calcium score 1 (40th percentile), minimal nonobstructive plaque in LAD.   -Given pain can also occur after eating, could be related to GERD and recommended starting on PPI to see if improvement - Reports chest pain has resolved  Palpitations: Zio patch x7 days on 05/15/2022 showed no significant  arrhythmias.  Hypertension: On carvedilol 6.25 mg twice daily and amlodipine 10 mg daily.  Appears controlled  Hyperlipidemia: LDL 166 on 08/22/2021.  10-year ASCVD risk score 10%.  Started rosuvastatin 10 mg daily.  LDL 78 on 07/29/2022  RTC in 6 months  Medication Adjustments/Labs and Tests Ordered: Current medicines are reviewed at length with the patient today.  Concerns regarding medicines are outlined above.  Orders Placed This Encounter  Procedures   Comprehensive metabolic panel   Brain natriuretic peptide   No orders of the defined types were placed in this encounter.   Patient Instructions  Medication Instructions:  NO CHANGES  *If you need a refill on your cardiac medications before your next appointment, please call your pharmacy*   Lab Work: CMET and BNP today   If you have labs (blood work) drawn today and your tests are completely normal, you will receive your results only by: [place] (if you have MyChart) OR A paper copy in the mail If you have any lab test that is abnormal or we need to change your treatment, we will call you to review the results.   Testing/Procedures: NONE   Follow-Up: At [facility], you and your health needs are our priority.  As part of our continuing mission to provide you with exceptional heart care, we have created designated Provider Care Teams.  These Care Teams include your primary Cardiologist (physician) and Advanced Practice Providers (APPs -  Physician Assistants and Nurse Practitioners) who all work together to provide you with the care you need, when you need it.  We recommend signing up for the patient portal called "MyChart".  Sign up information is provided on this After Visit Summary.  MyChart is used to connect with patients for Virtual Visits (Telemedicine).  Patients are able to view lab/test results, encounter notes, upcoming appointments, etc.  Non-urgent messages can be sent to your provider as well.    To learn more about what you can do with MyChart, go to [email or web address].    Your next appointment:   6 month(s)  Provider:   Dr. Gardiner Rhyme    Signed, Donato Heinz, MD  01/27/2023 8:29 AM    [place]

## 2023-01-27 ENCOUNTER — Ambulatory Visit: Payer: Medicare HMO | Attending: Cardiology | Admitting: Cardiology

## 2023-01-27 ENCOUNTER — Encounter: Payer: Self-pay | Admitting: Cardiology

## 2023-01-27 VITALS — BP 128/72 | HR 74 | Ht 61.0 in | Wt 190.2 lb

## 2023-01-27 DIAGNOSIS — I10 Essential (primary) hypertension: Secondary | ICD-10-CM

## 2023-01-27 DIAGNOSIS — I422 Other hypertrophic cardiomyopathy: Secondary | ICD-10-CM

## 2023-01-27 DIAGNOSIS — R600 Localized edema: Secondary | ICD-10-CM | POA: Diagnosis not present

## 2023-01-27 DIAGNOSIS — E785 Hyperlipidemia, unspecified: Secondary | ICD-10-CM

## 2023-01-27 NOTE — Patient Instructions (Signed)
Medication Instructions:  NO CHANGES  *If you need a refill on your cardiac medications before your next appointment, please call your pharmacy*   Lab Work: CMET and BNP today   If you have labs (blood work) drawn today and your tests are completely normal, you will receive your results only by: [place] (if you have MyChart) OR A paper copy in the mail If you have any lab test that is abnormal or we need to change your treatment, we will call you to review the results.   Testing/Procedures: NONE   Follow-Up: At [facility], you and your health needs are our priority.  As part of our continuing mission to provide you with exceptional heart care, we have created designated Provider Care Teams.  These Care Teams include your primary Cardiologist (physician) and Advanced Practice Providers (APPs -  Physician Assistants and Nurse Practitioners) who all work together to provide you with the care you need, when you need it.  We recommend signing up for the patient portal called "MyChart".  Sign up information is provided on this After Visit Summary.  MyChart is used to connect with patients for Virtual Visits (Telemedicine).  Patients are able to view lab/test results, encounter notes, upcoming appointments, etc.  Non-urgent messages can be sent to your provider as well.   To learn more about what you can do with MyChart, go to [email or web address].    Your next appointment:   6 month(s)  Provider:   Dr. Gardiner Rhyme

## 2023-01-28 ENCOUNTER — Ambulatory Visit: Payer: Medicare HMO | Admitting: Pulmonary Disease

## 2023-01-28 LAB — COMPREHENSIVE METABOLIC PANEL
ALT: 42 IU/L — ABNORMAL HIGH (ref 0–32)
AST: 24 IU/L (ref 0–40)
Albumin/Globulin Ratio: 1.9 (ref 1.2–2.2)
Albumin: 4.6 g/dL (ref 3.8–4.8)
Alkaline Phosphatase: 129 IU/L — ABNORMAL HIGH (ref 44–121)
BUN/Creatinine Ratio: 31 — ABNORMAL HIGH (ref 12–28)
BUN: 19 mg/dL (ref 8–27)
Bilirubin Total: 0.3 mg/dL (ref 0.0–1.2)
CO2: 25 mmol/L (ref 20–29)
Calcium: 9.8 mg/dL (ref 8.7–10.3)
Chloride: 101 mmol/L (ref 96–106)
Creatinine, Ser: 0.61 mg/dL (ref 0.57–1.00)
Globulin, Total: 2.4 g/dL (ref 1.5–4.5)
Glucose: 94 mg/dL (ref 70–99)
Potassium: 4.4 mmol/L (ref 3.5–5.2)
Sodium: 139 mmol/L (ref 134–144)
Total Protein: 7 g/dL (ref 6.0–8.5)
eGFR: 96 mL/min/{1.73_m2} (ref >59)

## 2023-01-28 LAB — BRAIN NATRIURETIC PEPTIDE: BNP: 8.8 pg/mL (ref 0.0–100.0)

## 2023-01-28 NOTE — Progress Notes (Signed)
$'@Patient'B$  ID: Leslie Griffin, female    DOB: 1951/07/01, 72 y.o.   MRN: [id number]  Chief Complaint  Patient presents with   Consult    Pt is here for consult. Pt states that she came back negative for PNA. SO they are unsure what she has. Chest xray was done. Pt states she is not on any inhalers at the moment. Pt finished doxy a few days ago. Pt states that this started the Saturday after christmas.     Referring provider: Ailene Ards, NP  HPI:   72 y.o. woman whom we are seeing for evaluation of ongoing cough, prolonged bronchitis symptoms.  Most recent PCP note x 2 reviewed.  Residual state of health.  Got sick the Saturday after Christmas.  Seems like viral illness.  Since then has had prolonged cough.  Intermittently productive.  No family with things better or worse.  No position makes his better or worse.  No seasonal or environmental factors she can identify to make things better or worse.  Has gotten a couple courses of antibiotics, levofloxacin, more recently doxycycline.  Helps a little bit.  Not the time.  Symptoms overall gradually improved with time still quite persistent.  No other alleviating or exacerbating factors.  Chest x-ray 12/12/2022 personally reviewed interpret as clear lungs, no sign of pneumonia.  PMH: Hypertension, GERD Family history: Mother with CAD, father with CAD Surgical history: Hysterectomy, eye surgery Social history: Never smoker, lives in [place] / Pulmonary Flowsheets:   ACT:      No data to display          MMRC:     No data to display          Epworth:      No data to display          Tests:   FENO:  No results found for: "NITRICOXIDE"  PFT:     No data to display          WALK:      No data to display          Imaging: Personally reviewed and as per EMR and discussion in this note No results found.  Lab Results: Personally reviewed CBC    Component Value Date/Time   WBC 6.1  12/12/2022 1131   RBC 4.32 12/12/2022 1131   HGB 13.0 12/12/2022 1131   HCT 38.7 12/12/2022 1131   PLT 237.0 12/12/2022 1131   MCV 89.6 12/12/2022 1131   MCH 30.0 04/11/2022 1136   MCHC 33.5 12/12/2022 1131   RDW 13.9 12/12/2022 1131   LYMPHSABS 1.8 08/22/2021 0921   MONOABS 0.5 08/22/2021 0921   EOSABS 0.1 08/22/2021 0921   BASOSABS 0.0 08/22/2021 0921    BMET    Component Value Date/Time   NA 139 01/27/2023 0840   K 4.4 01/27/2023 0840   CL 101 01/27/2023 0840   CO2 25 01/27/2023 0840   GLUCOSE 94 01/27/2023 0840   GLUCOSE 91 12/12/2022 1131   BUN 19 01/27/2023 0840   CREATININE 0.61 01/27/2023 0840   CALCIUM 9.8 01/27/2023 0840   GFRNONAA >60 04/11/2022 1136    BNP    Component Value Date/Time   BNP WILL FOLLOW 01/27/2023 0840    ProBNP No results found for: "PROBNP"  Specialty Problems       Pulmonary Problems   Sinusitis   Community acquired pneumonia    Allergies  Allergen Reactions   Benadryl [Diphenhydramine Hcl] Shortness Of  Breath and Rash   Cephalexin Shortness Of Breath and Rash    *Keflex   Duloxetine Other (See Comments)    Violent tremors    Penicillins Shortness Of Breath and Rash    Childhood Reaction    Topiramate Shortness Of Breath   Sulfa Antibiotics Rash    Immunization History  Administered Date(s) Administered   Fluad Quad(high Dose 65+) 09/04/2022   Influenza, High Dose Seasonal PF 08/18/2018, 08/02/2019   Moderna Sars-Covid-2 Vaccination 12/29/2019, 01/26/2020   Pneumococcal Conjugate-13 08/18/2018   Pneumococcal Polysaccharide-23 08/28/2017   Tdap 07/15/2011, 04/03/2020    Past Medical History:  Diagnosis Date   Allergic rhinitis    Anxiety    Arthritis    Family history of colonic polyps    Fibromyalgia    Heart murmur    History of colonic polyps    Hypercholesterolemia    Hypertension    Mitral valve prolapse    Ocular migraine    Palpitations     Tobacco History: Social History   Tobacco Use   Smoking Status Never  Smokeless Tobacco Never   Counseling given: Not Answered   Continue to not smoke  Outpatient Encounter Medications as of 12/17/2022  Medication Sig   acetaminophen (TYLENOL) 500 MG tablet Take 500-1,000 mg by mouth every 8 (eight) hours as needed.   amLODipine (NORVASC) 10 MG tablet Take 1 tablet (10 mg total) by mouth daily.   budesonide-formoterol (SYMBICORT) 160-4.5 MCG/ACT inhaler Inhale 2 puffs into the lungs 2 (two) times daily.   carvedilol (COREG) 6.25 MG tablet Take 1 tablet (6.25 mg total) by mouth 2 (two) times daily.   cholecalciferol (VITAMIN D) 25 MCG (1000 UNIT) tablet Take 1,000 Units by mouth daily.   cyanocobalamin 1000 MCG tablet Take 1 tablet by mouth daily.   Omega-3 Fatty Acids (FISH OIL) 1200 MG CAPS Take 2,400 mg by mouth daily.   omeprazole (PRILOSEC) 20 MG capsule Take 1 capsule (20 mg total) by mouth daily. Please schedule appointment for additional refills.   rosuvastatin (CRESTOR) 10 MG tablet Take 1 tablet (10 mg total) by mouth daily.   [DISCONTINUED] albuterol (VENTOLIN HFA) 108 (90 Base) MCG/ACT inhaler Inhale 2 puffs into the lungs every 6 (six) hours as needed for wheezing or shortness of breath.   [DISCONTINUED] doxycycline (VIBRA-TABS) 100 MG tablet Take 1 tablet (100 mg total) by mouth 2 (two) times daily.   [DISCONTINUED] ibuprofen (ADVIL) 600 MG tablet Take 600 mg by mouth every 8 (eight) hours as needed.   [DISCONTINUED] levofloxacin (LEVAQUIN) 750 MG tablet Take 1 tablet (750 mg total) by mouth daily.   No facility-administered encounter medications on file as of 12/17/2022.     Review of Systems  Review of Systems  No chest pain with exertion.  No orthopnea or PND.  Comprehensive review of systems otherwise negative. Physical Exam  BP 128/70 (BP Location: Left Arm, Patient Position: Sitting, Cuff Size: Normal)   Pulse 72   Wt 182 lb (82.6 kg)   SpO2 100%   BMI 34.39 kg/m   Wt Readings from Last 5 Encounters:   01/27/23 190 lb 3.2 oz (86.3 kg)  12/17/22 182 lb (82.6 kg)  12/12/22 187 lb 6 oz (85 kg)  12/04/22 180 lb (81.6 kg)  10/28/22 190 lb 6.4 oz (86.4 kg)    BMI Readings from Last 5 Encounters:  01/27/23 35.94 kg/m  12/17/22 34.39 kg/m  12/12/22 35.40 kg/m  12/04/22 34.01 kg/m  10/28/22 35.98 kg/m  Physical Exam General: Sitting in chair, no acute distress Eyes: EOMI, no icterus Neck: Supple, no JVP Pulmonary: Clear, normal work of breathing Cardiovascular: Warm, no edema Abdomen: Nondistended, bowel sounds present MSK: No synovitis, no joint effusion Neuro: Normal gait, no weakness Psych: Normal mood, full affect   Assessment & Plan:   Chronic cough/prolonged bronchitis: Started around Christmas, shortly thereafter.  Suspect viral illness triggered ongoing postviral syndrome versus asthma given prolonged symptoms.  Chest imaging clear.  Trial Symbicort 2 puff twice daily.   Return in about 6 weeks (around 01/28/2023).   Lanier Clam, MD 01/28/2023

## 2023-01-30 ENCOUNTER — Encounter: Payer: Self-pay | Admitting: Pulmonary Disease

## 2023-01-30 ENCOUNTER — Ambulatory Visit: Payer: Medicare HMO | Admitting: Pulmonary Disease

## 2023-01-30 VITALS — BP 130/74 | HR 74 | Wt 190.8 lb

## 2023-01-30 DIAGNOSIS — R058 Other specified cough: Secondary | ICD-10-CM

## 2023-01-30 NOTE — Patient Instructions (Signed)
Okay to stay off Symbicort  Return to clinic as needed

## 2023-01-30 NOTE — Progress Notes (Signed)
@Patient  ID: Leslie Griffin, female    DOB: 1951-11-06, 72 y.o.   MRN: [id number]  Chief Complaint  Patient presents with   Follow-up    Pt is here for follow up for bronchitis. Pt states that she has not had to use her symbicort that much lately and that she is doing well.     Referring provider: Ailene Ards, NP  HPI:   72 y.o. woman whom we are seeing for evaluation of ongoing cough, prolonged bronchitis symptoms.  Most recent cardiology note reviewed.  At last visit, given prolonged bronchitis concern for postviral syndrome versus asthma.  Placed on Symbicort.  Greatly improved quality of voice as well as cough, other symptoms.  She stopped the Symbicort about 2 weeks ago.  No resumption of symptoms.  Doing well.  No complaints.  HPI at initial visit: Was in usual state of health.  Got sick the Saturday after Christmas.  Seems like viral illness.  Since then has had prolonged cough.  Intermittently productive.  No family with things better or worse.  No position makes his better or worse.  No seasonal or environmental factors she can identify to make things better or worse.  Has gotten a couple courses of antibiotics, levofloxacin, more recently doxycycline.  Helps a little bit.  Not the time.  Symptoms overall gradually improved with time still quite persistent.  No other alleviating or exacerbating factors.  Chest x-ray 12/12/2022 personally reviewed interpret as clear lungs, no sign of pneumonia.  PMH: Hypertension, GERD Family history: Mother with CAD, father with CAD Surgical history: Hysterectomy, eye surgery Social history: Never smoker, lives in [place] / Pulmonary Flowsheets:   ACT:      No data to display           MMRC:     No data to display           Epworth:      No data to display           Tests:   FENO:  No results found for: "NITRICOXIDE"  PFT:     No data to display           WALK:      No data to  display           Imaging: Personally reviewed and as per EMR and discussion in this note No results found.  Lab Results: Personally reviewed CBC    Component Value Date/Time   WBC 6.1 12/12/2022 1131   RBC 4.32 12/12/2022 1131   HGB 13.0 12/12/2022 1131   HCT 38.7 12/12/2022 1131   PLT 237.0 12/12/2022 1131   MCV 89.6 12/12/2022 1131   MCH 30.0 04/11/2022 1136   MCHC 33.5 12/12/2022 1131   RDW 13.9 12/12/2022 1131   LYMPHSABS 1.8 08/22/2021 0921   MONOABS 0.5 08/22/2021 0921   EOSABS 0.1 08/22/2021 0921   BASOSABS 0.0 08/22/2021 0921    BMET    Component Value Date/Time   NA 139 01/27/2023 0840   K 4.4 01/27/2023 0840   CL 101 01/27/2023 0840   CO2 25 01/27/2023 0840   GLUCOSE 94 01/27/2023 0840   GLUCOSE 91 12/12/2022 1131   BUN 19 01/27/2023 0840   CREATININE 0.61 01/27/2023 0840   CALCIUM 9.8 01/27/2023 0840   GFRNONAA >60 04/11/2022 1136    BNP    Component Value Date/Time   BNP 8.8 01/27/2023 0840    ProBNP No results found for: "PROBNP"  Specialty Problems       Pulmonary Problems   Sinusitis   Community acquired pneumonia    Allergies  Allergen Reactions   Benadryl [Diphenhydramine Hcl] Shortness Of Breath and Rash   Cephalexin Shortness Of Breath and Rash    *Keflex   Duloxetine Other (See Comments)    Violent tremors    Penicillins Shortness Of Breath and Rash    Childhood Reaction    Topiramate Shortness Of Breath   Sulfa Antibiotics Rash    Immunization History  Administered Date(s) Administered   Fluad Quad(high Dose 65+) 09/04/2022   Influenza, High Dose Seasonal PF 08/18/2018, 08/02/2019   Moderna Sars-Covid-2 Vaccination 12/29/2019, 01/26/2020   Pneumococcal Conjugate-13 08/18/2018   Pneumococcal Polysaccharide-23 08/28/2017   Tdap 07/15/2011, 04/03/2020    Past Medical History:  Diagnosis Date   Allergic rhinitis    Anxiety    Arthritis    Family history of colonic polyps    Fibromyalgia    Heart murmur     History of colonic polyps    Hypercholesterolemia    Hypertension    Mitral valve prolapse    Ocular migraine    Palpitations     Tobacco History: Social History   Tobacco Use  Smoking Status Never  Smokeless Tobacco Never   Counseling given: Not Answered   Continue to not smoke  Outpatient Encounter Medications as of 01/30/2023  Medication Sig   acetaminophen (TYLENOL) 500 MG tablet Take 500-1,000 mg by mouth every 8 (eight) hours as needed.   amLODipine (NORVASC) 10 MG tablet Take 1 tablet (10 mg total) by mouth daily.   budesonide-formoterol (SYMBICORT) 160-4.5 MCG/ACT inhaler Inhale 2 puffs into the lungs 2 (two) times daily.   carvedilol (COREG) 6.25 MG tablet Take 1 tablet (6.25 mg total) by mouth 2 (two) times daily.   cholecalciferol (VITAMIN D) 25 MCG (1000 UNIT) tablet Take 1,000 Units by mouth daily.   cyanocobalamin 1000 MCG tablet Take 1 tablet by mouth daily.   Omega-3 Fatty Acids (FISH OIL) 1200 MG CAPS Take 2,400 mg by mouth daily.   omeprazole (PRILOSEC) 20 MG capsule Take 1 capsule (20 mg total) by mouth daily. Please schedule appointment for additional refills.   rosuvastatin (CRESTOR) 10 MG tablet Take 1 tablet (10 mg total) by mouth daily.   No facility-administered encounter medications on file as of 01/30/2023.     Review of Systems  Review of Systems  N/a Physical Exam  Pulse 74   Wt 190 lb 12.8 oz (86.5 kg)   SpO2 96%   BMI 36.05 kg/m   Wt Readings from Last 5 Encounters:  01/30/23 190 lb 12.8 oz (86.5 kg)  01/27/23 190 lb 3.2 oz (86.3 kg)  12/17/22 182 lb (82.6 kg)  12/12/22 187 lb 6 oz (85 kg)  12/04/22 180 lb (81.6 kg)    BMI Readings from Last 5 Encounters:  01/30/23 36.05 kg/m  01/27/23 35.94 kg/m  12/17/22 34.39 kg/m  12/12/22 35.40 kg/m  12/04/22 34.01 kg/m     Physical Exam General: Sitting in chair, no acute distress Eyes: EOMI, no icterus Neck: Supple, no JVP Pulmonary: Clear, normal work of  breathing Cardiovascular: Warm, no edema Abdomen: Nondistended, bowel sounds present MSK: No synovitis, no joint effusion Neuro: Normal gait, no weakness Psych: Normal mood, full affect   Assessment & Plan:   Chronic cough/prolonged bronchitis: Started around Christmas, shortly thereafter.  Suspect viral illness triggered ongoing postviral syndrome versus asthma given prolonged symptoms.  Chest imaging clear.  Was placed on high dose Symbicort 2 puff twice daily with marked improvement in symptoms.  Cough not gone.  Off Symbicort for the last 2 weeks.  Okay to stay off Symbicort.  She can resume if symptoms were to recur and let me know.  Chronic pain: Largest contributor to the lack of mobility.  She has ongoing follow-up with orthopedics.   Return if symptoms worsen or fail to improve.   Lanier Clam, MD 01/30/2023

## 2023-02-01 ENCOUNTER — Encounter: Payer: Self-pay | Admitting: Orthopedic Surgery

## 2023-02-03 ENCOUNTER — Ambulatory Visit: Payer: Medicare HMO | Admitting: Orthopedic Surgery

## 2023-02-03 DIAGNOSIS — M25872 Other specified joint disorders, left ankle and foot: Secondary | ICD-10-CM

## 2023-02-04 ENCOUNTER — Ambulatory Visit: Payer: Medicare HMO | Admitting: Family

## 2023-02-08 ENCOUNTER — Encounter: Payer: Self-pay | Admitting: Orthopedic Surgery

## 2023-02-08 NOTE — Progress Notes (Signed)
Office Visit Note   Patient: Leslie Griffin           Date of Birth: 1951/07/30           MRN: [id number] Visit Date: 02/03/2023              Requested by: Ailene Ards, NP [address] [place],   [address] PCP: Ailene Ards, NP  Chief Complaint  Patient presents with   Left Ankle - Pain      HPI: Patient is a 72 year old woman who is status post left talonavicular and subtalar fusion December 2022.  Patient complains of anterior left ankle pain and numbness.  Patient feels unstable with her ankle.  Patient states she has start up stiffness that loosens up as the day progresses.  Occasionally has spasms.  Assessment & Plan: Visit Diagnoses:  1. Impingement syndrome of left ankle     Plan: Patient is having impingement symptoms in her left ankle.  Patient has had temporary relief with previous injections in the ankle and would like to consider arthroscopic debridement.  Risks and benefits were discussed including persistent pain need for additional surgery.  Patient states she understands wished to proceed at this time.  Follow-Up Instructions: Return if symptoms worsen or fail to improve.   Ortho Exam  Patient is alert, oriented, no adenopathy, well-dressed, normal affect, normal respiratory effort. Patient's previous radiographs December 2023 showed a stable fusion and joint space narrowing in the ankle consistent with arthritis.  Patient has pain to palpation anteriorly.  Anterior drawer is stable.  Patient also states she has hip arthritis and arthritis of her back.  Patient has osteoarthritis of her left shoulder.  Imaging: No results found. No images are attached to the encounter.  Labs: Lab Results  Component Value Date   HGBA1C 5.8 08/22/2021     Lab Results  Component Value Date   ALBUMIN 4.6 01/27/2023   ALBUMIN 4.5 06/06/2022   ALBUMIN 4.4 08/22/2021    No results found for: "MG" No results found for: "VD25OH"  No results found for:  "PREALBUMIN"    Latest Ref Rng & Units 12/12/2022   11:31 AM 04/11/2022   11:36 AM 10/29/2021    9:41 AM  CBC EXTENDED  WBC 4.0 - 10.5 K/uL 6.1  8.5  5.5   RBC 3.87 - 5.11 Mil/uL 4.32  4.96  4.45   Hemoglobin 12.0 - 15.0 g/dL 13.0  14.9  13.4   HCT 36.0 - 46.0 % 38.7  45.7  41.7   Platelets 150.0 - 400.0 K/uL 237.0  243  210      There is no height or weight on file to calculate BMI.  Orders:  No orders of the defined types were placed in this encounter.  No orders of the defined types were placed in this encounter.    Procedures: No procedures performed  Clinical Data: No additional findings.  ROS:  All other systems negative, except as noted in the HPI. Review of Systems  Objective: Vital Signs: There were no vitals taken for this visit.  Specialty Comments:  No specialty comments available.  PMFS History: Patient Active Problem List   Diagnosis Date Noted   Fibromyalgia 10/28/2022   Osteoarthritis 10/28/2022   Constipation 09/04/2022   Need for vaccination 09/04/2022   Primary osteoarthritis of left shoulder 05/13/2022   Prediabetes 05/01/2022   Murmur 04/17/2022   Posterior tibial tendinitis, left leg    HLD (hyperlipidemia) 08/22/2021   Visual  snow syndrome 12/30/2020   Arthropathy of lumbar facet joint 11/12/2020   Chest pain 06/14/2020   Dissociative episodes 06/14/2020   Laceration of right index finger without foreign body without damage to nail 04/03/2020   Multiple drug allergies 04/03/2020   B12 deficiency 03/13/2020   Generalized abdominal pain 03/13/2020   Low serum iron 10/03/2019   Low vitamin D level 10/03/2019   Ocular migraine 10/03/2019   Familial hypercholesterolemia 06/27/2019   Upper GI bleed 06/27/2019   Right foot pain 03/27/2019   Generalized pain 10/08/2018   Hypertension [id number]   Diastolic dysfunction, left ventricle 08/18/2018   Family history of macular degeneration 08/18/2018   GERD (gastroesophageal reflux  disease) 08/18/2018   History of cataract surgery 08/18/2018   History of migraine 08/18/2018   Hypertensive cardiomyopathy, without heart failure ([place]) 08/18/2018   Medication refill 08/18/2018   Mixed dyslipidemia 08/18/2018   Obesity, Class II, BMI 35-39.9 08/18/2018   Wellness examination 08/18/2018   Cardiomyopathy ([place]) 02/03/2018   Pain and swelling of ankle, right 01/27/2018   Left-sided low back pain without sciatica 12/04/2017   Fatigue 12/04/2017   Anxiety 11/04/2017   Arthralgia 11/04/2017   History of attempted suicide 11/04/2017   Insomnia 11/04/2017   Moderate episode of recurrent major depressive disorder ([place]) 11/04/2017   Past Medical History:  Diagnosis Date   Allergic rhinitis    Anxiety    Arthritis    Family history of colonic polyps    Fibromyalgia    Heart murmur    History of colonic polyps    Hypercholesterolemia    Hypertension    Mitral valve prolapse    Ocular migraine    Palpitations     Family History  Problem Relation Age of Onset   Coronary artery disease Other    Heart disease Mother    Heart disease Father    Colon polyps Sister    Other Sister        low hemoglobin   Colon cancer Neg Hx    Esophageal cancer Neg Hx    Rectal cancer Neg Hx    Stomach cancer Neg Hx     Past Surgical History:  Procedure Laterality Date   ABDOMINAL HYSTERECTOMY     age was in her 8s   COLONOSCOPY  10/05/2017   Mild sigmoid diverticulosis. Otherwise normal colonoscopy.    cyst removal arm Left 1981   from cat scratch fever   EYE SURGERY     bilateral cataract removal   FOOT ARTHRODESIS Left 11/01/2021   Procedure: LEFT TALONAVICULAR AND SUBTALAR FUSION;  Surgeon: Newt Minion, MD;  Location: [facility];  Service: Orthopedics;  Laterality: Left;   Social History   Occupational History   Not on file  Tobacco Use   Smoking status: Never   Smokeless tobacco: Never  Vaping Use   Vaping Use: Never used  Substance and Sexual Activity   Alcohol  use: Yes    Comment: ocassionally    Drug use: No   Sexual activity: Not on file

## 2023-03-02 ENCOUNTER — Encounter: Payer: Self-pay | Admitting: *Deleted

## 2023-03-06 ENCOUNTER — Ambulatory Visit (INDEPENDENT_AMBULATORY_CARE_PROVIDER_SITE_OTHER): Payer: Medicare HMO

## 2023-03-06 ENCOUNTER — Ambulatory Visit (INDEPENDENT_AMBULATORY_CARE_PROVIDER_SITE_OTHER): Payer: Medicare HMO | Admitting: Nurse Practitioner

## 2023-03-06 VITALS — BP 110/68 | HR 75 | Temp 98.2°F | Ht 61.0 in | Wt 191.0 lb

## 2023-03-06 DIAGNOSIS — G4700 Insomnia, unspecified: Secondary | ICD-10-CM | POA: Diagnosis not present

## 2023-03-06 DIAGNOSIS — J9811 Atelectasis: Secondary | ICD-10-CM | POA: Diagnosis not present

## 2023-03-06 DIAGNOSIS — R61 Generalized hyperhidrosis: Secondary | ICD-10-CM

## 2023-03-06 LAB — COMPREHENSIVE METABOLIC PANEL
ALT: 21 U/L (ref 0–35)
AST: 20 U/L (ref 0–37)
Albumin: 4.6 g/dL (ref 3.5–5.2)
Alkaline Phosphatase: 89 U/L (ref 39–117)
BUN: 13 mg/dL (ref 6–23)
CO2: 30 mEq/L (ref 19–32)
Calcium: 9.4 mg/dL (ref 8.4–10.5)
Chloride: 102 mEq/L (ref 96–112)
Creatinine, Ser: 0.55 mg/dL (ref 0.40–1.20)
GFR: 92.31 mL/min (ref >60.00)
Glucose, Bld: 89 mg/dL (ref 70–99)
Potassium: 3.6 mEq/L (ref 3.5–5.1)
Sodium: 139 mEq/L (ref 135–145)
Total Bilirubin: 0.4 mg/dL (ref 0.2–1.2)
Total Protein: 7.7 g/dL (ref 6.0–8.3)

## 2023-03-06 LAB — URINALYSIS WITH CULTURE, IF INDICATED
Bilirubin Urine: NEGATIVE
Hgb urine dipstick: NEGATIVE
Ketones, ur: NEGATIVE
Leukocytes,Ua: NEGATIVE
Nitrite: NEGATIVE
RBC / HPF: NONE SEEN (ref 0–?)
Specific Gravity, Urine: 1.015 (ref 1.000–1.030)
Total Protein, Urine: NEGATIVE
Urine Glucose: NEGATIVE
Urobilinogen, UA: 0.2 (ref 0.0–1.0)
WBC, UA: NONE SEEN (ref 0–?)
pH: 7.5 (ref 5.0–8.0)

## 2023-03-06 LAB — T4, FREE: Free T4: 0.96 ng/dL (ref 0.60–1.60)

## 2023-03-06 LAB — CBC
HCT: 40.6 % (ref 36.0–46.0)
Hemoglobin: 13.7 g/dL (ref 12.0–15.0)
MCHC: 33.7 g/dL (ref 30.0–36.0)
MCV: 88.5 fl (ref 78.0–100.0)
Platelets: 252 10*3/uL (ref 150.0–400.0)
RBC: 4.59 Mil/uL (ref 3.87–5.11)
RDW: 14.3 % (ref 11.5–15.5)
WBC: 5.2 10*3/uL (ref 4.0–10.5)

## 2023-03-06 LAB — T3, FREE: T3, Free: 3.4 pg/mL (ref 2.3–4.2)

## 2023-03-06 LAB — TSH: TSH: 2.89 u[IU]/mL (ref 0.35–5.50)

## 2023-03-06 LAB — C-REACTIVE PROTEIN: CRP: <1 mg/dL (ref 0.5–20.0)

## 2023-03-06 MED ORDER — TRAZODONE HCL 50 MG PO TABS: 50.0000 mg | ORAL_TABLET | Freq: Every day | ORAL | 1 refills | Status: DC

## 2023-03-06 NOTE — Assessment & Plan Note (Signed)
Chronic Reportedly getting progressively worse Labs and imaging ordered today If no obvious etiology identified labs today consider CT scan of abdomen, chest, pelvis Further recommendations to be made based upon these results.

## 2023-03-06 NOTE — Assessment & Plan Note (Signed)
Secondary night sweats Patient allergic to Benadryl, thus would recommend against antihistamines Has tried and failed melatonin and magnesium over-the-counter Would prefer to avoid benzodiazepines Will trial low-dose trazodone 25-50mg  QHS PRN, we had a long shared decision-making discussion regarding risks especially heart arrhythmias and suicidal ideation.  Patient notified that if she experiences suicidal ideation or cardiac palpitations she should stop the medication and notify me.  Patient will follow-up in 2 to 4 weeks for close monitoring we will do repeat EKG at that time.

## 2023-03-06 NOTE — Progress Notes (Signed)
Established Patient Office Visit  Subjective   Patient ID: Leslie Griffin, female    DOB: 1951-06-08  Age: 72 y.o. MRN: [id number]  Chief Complaint  Patient presents with   Medical Management of Chronic Issues    Follow up, sweating at night time, every night at the same time around 12am. After this episode she start feeling cold     Has night sweats.  Reports that been going on for years, but recently seem to be more intense which she defines as occurring more frequently and lasting longer.  They occur at night, last for hours, makes it hard for her to sleep which is now affecting her quality of life.  Would like to discuss something for sleep and evaluation of night sweats.  Has history of hypertensive cardiomyopathy without heart failure.  Currently on amlodipine and Coreg, blood pressure well-controlled.  Also has anxiety.  Denies any known risk factors for tuberculosis or STIs.  Weights have been stable.  Denies any noticeable lymphadenopathy.  Denies any cardiac palpitations.  Last EKG was normal sinus rhythm with normal intervals this was collected in January 2024.  She has a history of complete hysterectomy which she tells me included removal of both ovaries approximately 40 years ago.    Review of Systems  Constitutional:  Positive for chills and diaphoresis. Negative for fever and weight loss.  Cardiovascular:  Negative for chest pain and palpitations.      Objective:     BP 110/68   Pulse 75   Temp 98.2 F (36.8 C) (Temporal)   Ht  (1.549 m)   Wt 191 lb (86.6 kg)   SpO2 97%   BMI 36.09 kg/m  BP Readings from Last 3 Encounters:  03/06/23 110/68  01/30/23 130/74  01/27/23 128/72   Wt Readings from Last 3 Encounters:  03/06/23 191 lb (86.6 kg)  01/30/23 190 lb 12.8 oz (86.5 kg)  01/27/23 190 lb 3.2 oz (86.3 kg)      Physical Exam Vitals reviewed.  Constitutional:      General: She is not in acute distress.    Appearance: Normal appearance.  HENT:      Head: Normocephalic and atraumatic.  Neck:     Vascular: No carotid bruit.  Cardiovascular:     Rate and Rhythm: Normal rate and regular rhythm.     Pulses: Normal pulses.     Heart sounds: Normal heart sounds.  Pulmonary:     Effort: Pulmonary effort is normal.     Breath sounds: Normal breath sounds.  Lymphadenopathy:     Head:     Right side of head: No submental, submandibular, tonsillar, preauricular, posterior auricular or occipital adenopathy.     Left side of head: No submental, submandibular, tonsillar, preauricular, posterior auricular or occipital adenopathy.     Cervical: No cervical adenopathy.     Upper Body:     Right upper body: No supraclavicular or axillary adenopathy.     Left upper body: No supraclavicular or axillary adenopathy.  Skin:    General: Skin is warm and dry.  Neurological:     General: No focal deficit present.     Mental Status: She is alert and oriented to person, place, and time.  Psychiatric:        Mood and Affect: Mood normal.        Behavior: Behavior normal.        Judgment: Judgment normal.      No results found for any  visits on 03/06/23.    The 10-year ASCVD risk score (Arnett DK, et al., 2019) is: 9.5%    Assessment & Plan:   Problem List Items Addressed This Visit       Other   Insomnia    Secondary night sweats Patient allergic to Benadryl, thus would recommend against antihistamines Has tried and failed melatonin and magnesium over-the-counter Would prefer to avoid benzodiazepines Will trial low-dose trazodone 25-50mg  QHS PRN, we had a long shared decision-making discussion regarding risks especially heart arrhythmias and suicidal ideation.  Patient notified that if she experiences suicidal ideation or cardiac palpitations she should stop the medication and notify me.  Patient will follow-up in 2 to 4 weeks for close monitoring we will do repeat EKG at that time.      Relevant Medications   traZODone (DESYREL) 50 MG  tablet   Night sweats - Primary    Chronic Reportedly getting progressively worse Labs and imaging ordered today If no obvious etiology identified labs today consider CT scan of abdomen, chest, pelvis Further recommendations to be made based upon these results.      Relevant Orders   QuantiFERON-TB Gold Plus   TSH   CBC   Comprehensive metabolic panel   HIV Antibody (routine testing w rflx)   RPR   T3, free   T4, free   C-reactive protein   Urinalysis with Culture, if indicated   DG Chest 2 View    Return in about 2 weeks (around 03/20/2023) for 2-4 weeks; F/U with Maralyn Sago.    Elenore Paddy, NP

## 2023-03-07 LAB — SYPHILIS: RPR W/REFLEX TO RPR TITER AND TREPONEMAL ANTIBODIES, TRADITIONAL SCREENING AND DIAGNOSIS ALGORITHM: RPR Ser Ql: NONREACTIVE

## 2023-03-07 LAB — HIV ANTIBODY (ROUTINE TESTING W REFLEX): HIV 1&2 Ab, 4th Generation: NONREACTIVE

## 2023-03-08 LAB — QUANTIFERON-TB GOLD PLUS
Mitogen-NIL: >10 IU/mL
NIL: 0.02 IU/mL
QuantiFERON-TB Gold Plus: NEGATIVE
TB1-NIL: <0 IU/mL
TB2-NIL: <0 IU/mL

## 2023-03-10 ENCOUNTER — Encounter: Payer: Self-pay | Admitting: Physical Medicine & Rehabilitation

## 2023-03-10 ENCOUNTER — Encounter: Payer: Medicare HMO | Attending: Physical Medicine & Rehabilitation | Admitting: Physical Medicine & Rehabilitation

## 2023-03-10 VITALS — BP 132/84 | HR 65 | Ht 61.0 in | Wt 191.4 lb

## 2023-03-10 DIAGNOSIS — G8929 Other chronic pain: Secondary | ICD-10-CM | POA: Diagnosis not present

## 2023-03-10 DIAGNOSIS — M797 Fibromyalgia: Secondary | ICD-10-CM | POA: Diagnosis not present

## 2023-03-10 DIAGNOSIS — M542 Cervicalgia: Secondary | ICD-10-CM | POA: Insufficient documentation

## 2023-03-10 DIAGNOSIS — M545 Low back pain, unspecified: Secondary | ICD-10-CM | POA: Diagnosis not present

## 2023-03-10 NOTE — Progress Notes (Signed)
Subjective:    Patient ID: Leslie Griffin, female    DOB: 27-May-1951, 72 y.o.   MRN: [id number]  HPI HPI  Leslie Griffin is a 72 y.o. year old female  who  has a past medical history of Allergic rhinitis, Anxiety, Arthritis, Family history of colonic polyps, Fibromyalgia, Heart murmur, History of colonic polyps, Hypercholesterolemia, Hypertension, Mitral valve prolapse, Ocular migraine, and Palpitations.   They are presenting to PM&R clinic as a new patient for pain management evaluation.  Patient reports she has had severe pain for over 30 years.  She was diagnosed with fibromyalgia many years ago.  Patient reports she was previously on fibromyalgia medication such as gabapentin and Lyrica but both of these quit working.  She was unable to tolerate Cymbalta.  She says her pain has recently worsened due to areas of arthritis that have occurred over the past several years.  She had a left talonavicular and subtalar fusion in December 2022.  she is continue to have pain in her foot since the operation.  Currently her greatest pain is in her lower back, shoulders, right hip, right knee.  Her neck is also painful.  Pain is worsened with activity.  She feels like her mood is decreased due to her pain.  She feels that pain is severely limiting her activities.  She tries to do housework at home but this is very painful.  She has been seen by orthopedics for left rotator cuff tear.   Red flag symptoms: No red flags for back pain endorsed in Hx or ROS   Medications tried: Topical medications- arthritis cream, blu emu didn't help Nsaids doesn't help, ibuprofen Tylenol  doesn't help Opiates  Hydrocodone quit working Oxycodone-mild benefit Tramadol didn't help  Gabapentin - for a few years, was helping at first but stopped  Lyrica -  quit working  TCAs  amitripyline, doesn't recall SNRIs  - Rash with cymbalta and shortness of breath Milnacipran- recalls the name but does not recall trying this CBD oils  didn't help  Other treatments: PT/OT  - Reports completed this recently for her shoulder TENs unit - has not tried  Injections - Reports nerve ablasion of her back  in [place] or [place], C4=5, prior shoulder injections.  She reports minimal benefit with injections Surgery - L ankle surgery   Prior UDS results: No results found for: "LABOPIA", "COCAINSCRNUR", "LABBENZ", "AMPHETMU", "THCU", "LABBARB"   Pain Inventory Average Pain 10 Pain Right Now 10 My pain is sharp, burning, stabbing, tingling, and aching  In the last 24 hours, has pain interfered with the following? General activity 7 Relation with others 7 Enjoyment of life 9 What TIME of day is your pain at its worst? morning , daytime, evening, and night Sleep (in general) Poor  Pain is worse with: walking, bending, sitting, inactivity, and standing Pain improves with: rest Relief from Meds: 1  use a cane ability to climb steps?  yes do you drive?  yes Do you have any goals in this area?  yes  retired I need assistance with the following:  meal prep, household duties, and shopping  weakness trouble walking spasms dizziness confusion depression anxiety  Any changes since last visit?  no  Primary care Jiles Prows NP Orthopedist Hattie Perch MD    Family History  Problem Relation Age of Onset  . Coronary artery disease Other   . Heart disease Mother   . Heart disease Father   . Colon polyps Sister   .  Other Sister        low hemoglobin  . Colon cancer Neg Hx   . Esophageal cancer Neg Hx   . Rectal cancer Neg Hx   . Stomach cancer Neg Hx    Social History   Socioeconomic History  . Marital status: Married    Spouse name: Not on file  . Number of children: 1  . Years of education: Not on file  . Highest education level: Associate degree: occupational, Scientist, product/process development, or vocational program  Occupational History  . Not on file  Tobacco Use  . Smoking status: Never  . Smokeless tobacco: Never   Vaping Use  . Vaping Use: Never used  Substance and Sexual Activity  . Alcohol use: Yes    Comment: ocassionally   . Drug use: No  . Sexual activity: Not on file  Other Topics Concern  . Not on file  Social History Narrative  . Not on file   Social Determinants of Health   Financial Resource Strain: Low Risk  (03/02/2023)   Overall Financial Resource Strain (CARDIA)   . Difficulty of Paying Living Expenses: Not hard at all  Food Insecurity: No Food Insecurity (03/02/2023)   Hunger Vital Sign   . Worried About Programme researcher, broadcasting/film/video in the Last Year: Never true   . Ran Out of Food in the Last Year: Never true  Transportation Needs: No Transportation Needs (03/02/2023)   PRAPARE - Transportation   . Lack of Transportation (Medical): No   . Lack of Transportation (Non-Medical): No  Physical Activity: Unknown (03/02/2023)   Exercise Vital Sign   . Days of Exercise per Week: 0 days   . Minutes of Exercise per Session: Not on file  Stress: Stress Concern Present (03/02/2023)   [employer] of Occupational Health - Occupational Stress Questionnaire   . Feeling of Stress : Very much  Social Connections: Unknown (03/02/2023)   Social Connection and Isolation Panel [NHANES]   . Frequency of Communication with Friends and Family: More than three times a week   . Frequency of Social Gatherings with Friends and Family: Patient declined   . Attends Religious Services: Never   . Active Member of Clubs or Organizations: Patient declined   . Attends Banker Meetings: Not on file   . Marital Status: Married   Past Surgical History:  Procedure Laterality Date  . ABDOMINAL HYSTERECTOMY     age was in her 30s  . COLONOSCOPY  10/05/2017   Mild sigmoid diverticulosis. Otherwise normal colonoscopy.   . cyst removal arm Left 1981   from cat scratch fever  . EYE SURGERY     bilateral cataract removal  . FOOT ARTHRODESIS Left 11/01/2021   Procedure: LEFT TALONAVICULAR AND SUBTALAR  FUSION;  Surgeon: Nadara Mustard, MD;  Location: [facility];  Service: Orthopedics;  Laterality: Left;   Past Medical History:  Diagnosis Date  . Allergic rhinitis   . Anxiety   . Arthritis   . Family history of colonic polyps   . Fibromyalgia   . Heart murmur   . History of colonic polyps   . Hypercholesterolemia   . Hypertension   . Mitral valve prolapse   . Ocular migraine   . Palpitations    BP 132/84   Pulse 65   Ht  (1.549 m)   Wt 191 lb 6.4 oz (86.8 kg)   SpO2 97%   BMI 36.16 kg/m   Opioid Risk Score:   Fall Risk  Score:  `1  Depression screen [facility] 2/9     03/10/2023   10:17 AM 03/06/2023    8:28 AM 12/12/2022   10:23 AM 12/04/2022   11:39 AM 10/28/2022   11:13 AM 09/04/2022    8:47 AM 05/01/2022    8:50 AM  Depression screen PHQ 2/9  Decreased Interest 1 1 0 2 0 0 0  Down, Depressed, Hopeless 1 1 0 2 0 0 0  PHQ - 2 Score 2 2 0 4 0 0 0  Altered sleeping 3 3 0 2  0   Tired, decreased energy 3 3 0 3  0   Change in appetite 3 2 0 3  0   Feeling bad or failure about yourself  1 1 0 0  0   Trouble concentrating 1 1 0 0  0   Moving slowly or fidgety/restless 0 3 0 0  0   Suicidal thoughts 0 0 0 0  0   PHQ-9 Score 13 15 0 12  0   Difficult doing work/chores Somewhat difficult Somewhat difficult Not difficult at all Extremely dIfficult  Not difficult at all      Review of Systems  Constitutional:  Positive for appetite change, chills and diaphoresis.       Poor appetite  HENT: Negative.    Eyes: Negative.   Respiratory:  Positive for cough.   Cardiovascular:  Positive for leg swelling.  Gastrointestinal: Negative.   Endocrine: Negative.   Genitourinary: Negative.   Musculoskeletal:  Positive for gait problem, joint swelling and myalgias.       Spasms/ hx ankle replacement  Skin: Negative.   Allergic/Immunologic: Negative.   Neurological:  Positive for dizziness and weakness.  Hematological: Negative.   Psychiatric/Behavioral:  Positive for confusion and  dysphoric mood. The patient is nervous/anxious.   All other systems reviewed and are negative.      Objective:   Physical Exam  Gen: no distress, normal appearing HEENT: oral mucosa pink and moist, NCAT Cardio: Reg rate Chest: normal effort, normal rate of breathing Abd: soft, non-distended Ext: no edema Psych: pleasant, normal affect Skin: intact Neuro: Alert and awake, follows commands, cranial nerves II through XII grossly intact, normal speech and language Sensation intact light touch in all 4 extremities Altered sensation in her left foot however sensation overall intact light touch in all 4 extremities Strength 5 out of 5 in all 4 extremities DTR normal and symmetric No coordination deficits noted Musculoskeletal:  Facet loading negative SLR, FABER, FADIR negative but resulted in back pain Spurling's negative She walks with a cane Patient is tender to palpation throughout her wrists, elbows, shoulders, neck, lower back, hips, knees, ankles.  Every location palpated in her arms and legs and back was noted to be tender.  C spine Xray 04/29/22 FINDINGS: No acute fracture or subluxation identified. Minimal anterolisthesis of C3 on C4 noted. Mild to moderate intervertebral disc space narrowing from C4 through C7, with small dorsal endplate osteophytes. Evidence of mild facet hypertrophy. No prevertebral soft tissue swelling visualized.   IMPRESSION: Degenerative changes of the cervical spine.  Shoulder xary 04/17/22 IMPRESSION: 1. Mild to moderate glenohumeral and mild acromioclavicular osteoarthritis. 2. Small calcific density overlying the left axillary soft tissues. This is nonspecific. It could represent a partially calcified benign lymph node or sequela of remote soft tissue trauma.      Assessment & Plan:   Polyarthralgia -She has a long history of fibromyalgia, she appears to be tender throughout her  entire body.  Suspect fibromyalgia is greatest driving force  based hide her chronic pain.  Pain likely does have multifactorial component as she does have history of multiple arthritic issues including left shoulder rotator cuff tear, posterior tibial tendon tendinitis, left talonavicular and subtalar fusion. -TENS unit discussed, Zynax device ordered -Milnacipran ordered, 12.5 mg once on day 1, then 12.5 mg twice daily on days 2 to 3, 25 mg twice daily on days 4 to 7, then 50 mg twice daily thereafter, called this in to [facility] -Aquatherapy referral placed   Chronic lower back and neck pain, cervical spondylosis noted on prior x-ray -Asked her to provide records from MRI L spine and injections -Treatments as above -She reports prior lumbar injections, sounds like this was facet joint radiofrequency ablation.  She did not find this to be beneficial.  I asked her to to provide records regarding this

## 2023-03-11 ENCOUNTER — Encounter: Payer: Self-pay | Admitting: Physical Medicine & Rehabilitation

## 2023-03-13 ENCOUNTER — Telehealth: Payer: Self-pay

## 2023-03-13 ENCOUNTER — Encounter: Payer: Self-pay | Admitting: Nurse Practitioner

## 2023-03-13 NOTE — Telephone Encounter (Signed)
Called pt and left voice mail for her to drop by the office to pick up forms

## 2023-03-17 NOTE — Therapy (Signed)
OUTPATIENT PHYSICAL THERAPY LOWER EXTREMITY EVALUATION   Patient Name: Leslie Griffin MRN: [id number] DOB:11/05/1951, 72 y.o., female Today's Date: 03/18/2023  END OF SESSION:  PT End of Session - 03/18/23 1108     Visit Number 1    Date for PT Re-Evaluation 04/29/23    Authorization Type [employer]    Authorization Time Period 03/18/23 to 04/29/23    Authorization - Visit Number 1    Progress Note Due on Visit 10    PT Start Time 0934    PT Stop Time 1015    PT Time Calculation (min) 41 min    Activity Tolerance No increased pain;Patient tolerated treatment well    Behavior During Therapy [facility] for tasks assessed/performed             Past Medical History:  Diagnosis Date   Allergic rhinitis    Anxiety    Arthritis    Family history of colonic polyps    Fibromyalgia    Heart murmur    History of colonic polyps    Hypercholesterolemia    Hypertension    Mitral valve prolapse    Ocular migraine    Palpitations    Past Surgical History:  Procedure Laterality Date   ABDOMINAL HYSTERECTOMY     age was in her 30s   COLONOSCOPY  10/05/2017   Mild sigmoid diverticulosis. Otherwise normal colonoscopy.    cyst removal arm Left 1981   from cat scratch fever   EYE SURGERY     bilateral cataract removal   FOOT ARTHRODESIS Left 11/01/2021   Procedure: LEFT TALONAVICULAR AND SUBTALAR FUSION;  Surgeon: Nadara Mustard, MD;  Location: [facility] OR;  Service: Orthopedics;  Laterality: Left;   Patient Active Problem List   Diagnosis Date Noted   Night sweats 03/06/2023   Fibromyalgia 10/28/2022   Osteoarthritis 10/28/2022   Constipation 09/04/2022   Need for vaccination 09/04/2022   Primary osteoarthritis of left shoulder 05/13/2022   Prediabetes 05/01/2022   Murmur 04/17/2022   Posterior tibial tendinitis, left leg    HLD (hyperlipidemia) 08/22/2021   Visual snow syndrome 12/30/2020   Arthropathy of lumbar facet joint 11/12/2020   Chest pain 06/14/2020   Dissociative episodes  06/14/2020   Laceration of right index finger without foreign body without damage to nail 04/03/2020   Multiple drug allergies 04/03/2020   B12 deficiency 03/13/2020   Generalized abdominal pain 03/13/2020   Low serum iron 10/03/2019   Low vitamin D level 10/03/2019   Ocular migraine 10/03/2019   Familial hypercholesterolemia 06/27/2019   Upper GI bleed 06/27/2019   Right foot pain 03/27/2019   Generalized pain 10/08/2018   Hypertension 08/18/2018   Diastolic dysfunction, left ventricle 08/18/2018   Family history of macular degeneration 08/18/2018   GERD (gastroesophageal reflux disease) 08/18/2018   History of cataract surgery 08/18/2018   History of migraine 08/18/2018   Hypertensive cardiomyopathy, without heart failure (HCC) 08/18/2018   Medication refill 08/18/2018   Mixed dyslipidemia 08/18/2018   Obesity, Class II, BMI 35-39.9 08/18/2018   Wellness examination 08/18/2018   Cardiomyopathy (HCC) 02/03/2018   Pain and swelling of ankle, right 01/27/2018   Left-sided low back pain without sciatica 12/04/2017   Fatigue 12/04/2017   Anxiety 11/04/2017   Arthralgia 11/04/2017   History of attempted suicide 11/04/2017   Insomnia 11/04/2017   Moderate episode of recurrent major depressive disorder (HCC) 11/04/2017    PCP: Jiles Prows, NP  REFERRING PROVIDER: Fanny Dance, MD  REFERRING DIAG: Fibromyalgia  THERAPY  DIAG:  Pain of both hip joints  Unsteadiness on feet  Pain in left ankle and joints of left foot  Other low back pain  Rationale for Evaluation and Treatment: Rehabilitation  ONSET DATE: 30+ years   SUBJECTIVE:   SUBJECTIVE STATEMENT: Pt states that she has constant pain. She can't take any of the fibromyalgia medications because it either doesn't work or she's allergic. She had Lt shoulder pain and tingling over the past year. Said her tendons had retracted and couldn't get surgery. She had Lt foot arthrodesis in 2022 which she feels was  unsuccessful because of her continued pain and stiffness. She has to care for her husband who is legally blind. Her son has schizophrenia and she is very active in helping with him as well. She feels no one has been able to help her with her pain.  PERTINENT HISTORY: Fibromyalgia, Lt foot arthrodesis 2022, bilateral cataract removal with vision issues anxiety, Lt shoulder pain "several torn tendons" PAIN:  Are you having pain? Yes: NPRS scale: none given-always there/10 Pain location: "all over" Pain description: constant Aggravating factors: walking, moving, any activity Relieving factors: nothing  PRECAUTIONS: None  WEIGHT BEARING RESTRICTIONS: No  FALLS:  Has patient fallen in last 6 months? No  LIVING ENVIRONMENT: Lives with: lives with their spouse Lives in: House/apartment Stairs: Yes: External: just a few into the house steps; can reach both Has following equipment at home: Single point cane  OCCUPATION: retired   PLOF: Independent  PATIENT GOALS: be able to walk a mile  NEXT MD VISIT: none noted   OBJECTIVE:   DIAGNOSTIC FINDINGS:   PATIENT SURVEYS:    COGNITION: Overall cognitive status: Within functional limits for tasks assessed     SENSATION: Not tested   MUSCLE LENGTH: Hamstrings: Right WNL deg; Left WNL deg Prone Ely's: WNL bilateral  POSTURE: rounded shoulders, forward head, and flexed trunk   PALPATION: Tenderness bilateral glutes   LOWER EXTREMITY ROM:  Passive ROM Right eval Left eval  Hip flexion WNL WNL  Hip extension 0 0  Hip abduction    Hip adduction    Hip internal rotation WNL WNL  Hip external rotation WNL WNL  Knee flexion    Knee extension    Ankle dorsiflexion    Ankle plantarflexion    Ankle inversion    Ankle eversion     (Blank rows = not tested)  LOWER EXTREMITY MMT:  MMT Right eval Left eval  Hip flexion 4 4  Hip extension 3 3  Hip abduction 3 3  Hip adduction    Hip internal rotation    Hip external  rotation    Knee flexion    Knee extension 5 5  Ankle dorsiflexion 4 4  Ankle plantarflexion    Ankle inversion    Ankle eversion     (Blank rows = not tested)  LOWER EXTREMITY SPECIAL TESTS:    FUNCTIONAL TESTS:  5 times sit to stand: 9 sec 3 minute walk test: 475ft without AD   GAIT: Distance walked: 3 MWR Assistive device utilized: Single point cane and None Level of assistance: Modified independence Comments: decreased hip extension   TODAY'S TREATMENT:  DATE:  03/18/23 Pain education and how this impacts daily activity Supine bridge x5 reps HEP demo     PATIENT EDUCATION:  Education details: eval findings/POC; implemented HEP; pain education Person educated: Patient Education method: Explanation Education comprehension: verbalized understanding and returned demonstration  HOME EXERCISE PROGRAM: Access Code: [email or web address] URL: [email or web address] Date: 03/18/2023 Prepared by: [facility] - [facility]patient Rehab - [facility]  Exercises - Beginner Bridge  - 1 x daily - 7 x weekly - 2 sets - 10 reps  ASSESSMENT:  CLINICAL IMPRESSION: Patient is a 72 y.o. F who was seen today for physical therapy evaluation and treatment for chronic pain of the low back, hips, feet, neck and shoulders. Due to time limitations, pt felt that the hips were her most pressing issue. Pt ambulates with SPC secondary to feelings of instability and vision issues following cataract surgery several years ago. Pt is somewhat discouraged that nothing has seemed to help her pain in the past. She has history of Lt shoulder pain, Lt ankle/foot surgery, low back and neck pain, as well a fibromyalgia. She has noted weakness of bilateral hips during today's exam. She ambulated less than 521ft on , but she was able to do this without her AD. Pt would benefit  from skilled PT to address her limitations in endurance, strength and flexibility in order to increase her independence and ability to participate in ADLs and caring for her family.   OBJECTIVE IMPAIRMENTS: Abnormal gait, decreased activity tolerance, decreased balance, decreased endurance, decreased mobility, difficulty walking, decreased ROM, decreased strength, increased muscle spasms, impaired flexibility, improper body mechanics, and pain.   ACTIVITY LIMITATIONS: standing, squatting, sleeping, stairs, bed mobility, toileting, locomotion level, and caring for others  PARTICIPATION LIMITATIONS: meal prep, cleaning, laundry, shopping, community activity, yard work, and caring for husband  PERSONAL FACTORS: Age, Fitness, Past/current experiences, Time since onset of injury/illness/exacerbation, and 3+ comorbidities: Lt foot surgery/fusion, fibromyalgia, chronic back/hip pain  are also affecting patient's functional outcome.   REHAB POTENTIAL: Good  CLINICAL DECISION MAKING: Evolving/moderate complexity  EVALUATION COMPLEXITY: Moderate   GOALS: Goals reviewed with patient? Yes  SHORT TERM GOALS: Target date: 03/25/23 Pt will be independent with initial HEP and walking program. Baseline: Goal status: INITIAL     LONG TERM GOALS: Target date: 04/29/23  Pt will report atleast 30% improvement in activity participation from the start of PT. Baseline:  Goal status: INITIAL  2.  Pt will have increased LE strength to atleast 4/5 MMT to improve her efficiency with ADLs. Baseline:  Goal status: INITIAL  3.  Pt will be able to walk up to 6 minutes during a session without the need for a rest break of significant increase in pain.  Baseline:  Goal status: INITIAL  4.  Pt will be independent with her advanced HEP to allow for further improvement in strength and endurance after discharge from PT.  Baseline:  Goal status: INITIAL     PLAN:  PT FREQUENCY: 2x/week  PT DURATION: 6  weeks  PLANNED INTERVENTIONS: Therapeutic exercises, Therapeutic activity, Neuromuscular re-education, Balance training, Gait training, Patient/Family education, Self Care, Joint mobilization, Stair training, Aquatic Therapy, Dry Needling, Electrical stimulation, Cryotherapy, Moist heat, Taping, Manual therapy, and Re-evaluation  PLAN FOR NEXT SESSION: consider DN for hip/back with TENS if pt agreeable; discuss beginning walking program at home to work towards personal goal of 1 mile; hip/trunk strengthening and update HEP as able   8:17 PM,03/18/23 Donita Brooks PT, DPT [facility]  [place]  [phone]

## 2023-03-18 ENCOUNTER — Other Ambulatory Visit: Payer: Self-pay

## 2023-03-18 ENCOUNTER — Encounter: Payer: Self-pay | Admitting: Physical Therapy

## 2023-03-18 ENCOUNTER — Ambulatory Visit: Payer: Medicare HMO | Attending: Physical Medicine & Rehabilitation | Admitting: Physical Therapy

## 2023-03-18 DIAGNOSIS — M5459 Other low back pain: Secondary | ICD-10-CM | POA: Diagnosis not present

## 2023-03-18 DIAGNOSIS — M25572 Pain in left ankle and joints of left foot: Secondary | ICD-10-CM | POA: Diagnosis not present

## 2023-03-18 DIAGNOSIS — M25551 Pain in right hip: Secondary | ICD-10-CM | POA: Diagnosis not present

## 2023-03-18 DIAGNOSIS — M25612 Stiffness of left shoulder, not elsewhere classified: Secondary | ICD-10-CM | POA: Diagnosis not present

## 2023-03-18 DIAGNOSIS — M25552 Pain in left hip: Secondary | ICD-10-CM | POA: Insufficient documentation

## 2023-03-18 DIAGNOSIS — R2681 Unsteadiness on feet: Secondary | ICD-10-CM | POA: Insufficient documentation

## 2023-03-18 DIAGNOSIS — M25512 Pain in left shoulder: Secondary | ICD-10-CM | POA: Insufficient documentation

## 2023-03-18 DIAGNOSIS — M797 Fibromyalgia: Secondary | ICD-10-CM | POA: Diagnosis not present

## 2023-03-20 ENCOUNTER — Ambulatory Visit (INDEPENDENT_AMBULATORY_CARE_PROVIDER_SITE_OTHER): Payer: Medicare HMO | Admitting: Nurse Practitioner

## 2023-03-20 VITALS — BP 108/64 | HR 66 | Temp 98.3°F | Ht 61.0 in | Wt 190.0 lb

## 2023-03-20 DIAGNOSIS — E785 Hyperlipidemia, unspecified: Secondary | ICD-10-CM | POA: Diagnosis not present

## 2023-03-20 DIAGNOSIS — R61 Generalized hyperhidrosis: Secondary | ICD-10-CM

## 2023-03-20 DIAGNOSIS — K219 Gastro-esophageal reflux disease without esophagitis: Secondary | ICD-10-CM | POA: Diagnosis not present

## 2023-03-20 DIAGNOSIS — I119 Hypertensive heart disease without heart failure: Secondary | ICD-10-CM

## 2023-03-20 DIAGNOSIS — I43 Cardiomyopathy in diseases classified elsewhere: Secondary | ICD-10-CM | POA: Diagnosis not present

## 2023-03-20 DIAGNOSIS — Z136 Encounter for screening for cardiovascular disorders: Secondary | ICD-10-CM | POA: Insufficient documentation

## 2023-03-20 DIAGNOSIS — G4700 Insomnia, unspecified: Secondary | ICD-10-CM

## 2023-03-20 MED ORDER — AMLODIPINE BESYLATE 10 MG PO TABS
10.0000 mg | ORAL_TABLET | Freq: Every day | ORAL | 1 refills | Status: DC
Start: 2023-03-20 — End: 2023-06-05

## 2023-03-20 MED ORDER — ROSUVASTATIN CALCIUM 10 MG PO TABS
10.0000 mg | ORAL_TABLET | Freq: Every day | ORAL | 1 refills | Status: DC
Start: 2023-03-20 — End: 2023-11-03

## 2023-03-20 MED ORDER — OMEPRAZOLE 20 MG PO CPDR
20.0000 mg | DELAYED_RELEASE_CAPSULE | Freq: Every day | ORAL | 1 refills | Status: DC
Start: 2023-03-20 — End: 2024-04-01

## 2023-03-20 MED ORDER — CARVEDILOL 6.25 MG PO TABS
6.2500 mg | ORAL_TABLET | Freq: Two times a day (BID) | ORAL | 1 refills | Status: DC
Start: 2023-03-20 — End: 2023-06-05

## 2023-03-20 NOTE — Assessment & Plan Note (Signed)
Chronic Improved with use of trazodone EKG shows mild bradycardia, patient asymptomatic at this time.  Did collaborate with supervising physician and with no abnormal findings noted on work up so far and with stable weights, recommendation is to just monitor. Per shared decision making hold off on CT scans, patient told to notify me if she starts to experience unintentional weight loss, exertional fatigue, chest pain, cardiac palpitations, etc to notify me. At which point further evaluation will be completed.  Continue trazodone 25 to 50 mg by mouth as needed at bedtime

## 2023-03-20 NOTE — Assessment & Plan Note (Signed)
Chronic Continue omeprazole 20 mg daily 

## 2023-03-20 NOTE — Progress Notes (Signed)
Established Patient Office Visit  Subjective   Patient ID: Leslie Griffin, female    DOB: 14-Sep-1951  Age: 72 y.o. MRN: [id number]  Chief Complaint  Patient presents with   Medical Management of Chronic Issues    Insomnia/Night sweats: Last office visit was to workup insomnia and night sweats.  So far workup indicates normal kidney function and electrolytes, negative inflammatory markers, no anemia, normal thyroid function, no tuberculosis, negative for syphilis negative HIV, urinalysis was clear, chest x-ray had no acute findings.  She was initiated on trazodone 25 to 50 mg by mouth at night as needed.  Reports since starting this symptoms are much improved for both insomnia and night sweats.  Weights have been stable.  Also needs refill on chronic medications    ROS: see HPI    Objective:     BP 108/64   Pulse 66   Temp 98.3 F (36.8 C) (Temporal)   Ht 5\' 1"  (1.549 m)   Wt 190 lb (86.2 kg)   SpO2 98%   BMI 35.90 kg/m  BP Readings from Last 3 Encounters:  03/20/23 108/64  03/10/23 132/84  03/06/23 110/68   Wt Readings from Last 3 Encounters:  03/20/23 190 lb (86.2 kg)  03/10/23 191 lb 6.4 oz (86.8 kg)  03/06/23 191 lb (86.6 kg)      Physical Exam Vitals reviewed.  Constitutional:      General: She is not in acute distress.    Appearance: Normal appearance.  HENT:     Head: Normocephalic and atraumatic.  Neck:     Vascular: No carotid bruit.  Cardiovascular:     Rate and Rhythm: Normal rate and regular rhythm.     Pulses: Normal pulses.     Heart sounds: Murmur heard.  Pulmonary:     Effort: Pulmonary effort is normal.     Breath sounds: Normal breath sounds.  Skin:    General: Skin is warm and dry.  Neurological:     General: No focal deficit present.     Mental Status: She is alert and oriented to person, place, and time.  Psychiatric:        Mood and Affect: Mood normal.        Behavior: Behavior normal.        Judgment: Judgment normal.    EKG:  SB, HR 56  No results found for any visits on 03/20/23.    The 10-year ASCVD risk score (Arnett DK, et al., 2019) is: 9.2%    Assessment & Plan:   Problem List Items Addressed This Visit       Cardiovascular and Mediastinum   Hypertensive cardiomyopathy, without heart failure (HCC)    Chronic Continue amlodipine 10 mg/day, carvedilol 6.25 mg twice a day. EKG today shows mild sinus bradycardia, no arrhythmia. Patient is asymptomatic. No change to medication regimen recommended at this time. Patient educated to notify me if she starts to experience chest pain, exertional fatigue, SOB, cardiac palpitations, etc.  Follow-up with cardiology as scheduled.       Relevant Medications   amLODipine (NORVASC) 10 MG tablet   carvedilol (COREG) 6.25 MG tablet   rosuvastatin (CRESTOR) 10 MG tablet     Digestive   GERD (gastroesophageal reflux disease)    Chronic Continue omeprazole 20 mg daily      Relevant Medications   omeprazole (PRILOSEC) 20 MG capsule     Other   HLD (hyperlipidemia)    Chronic Continue rosuvastatin 10 mg daily  Relevant Medications   amLODipine (NORVASC) 10 MG tablet   carvedilol (COREG) 6.25 MG tablet   rosuvastatin (CRESTOR) 10 MG tablet   Insomnia - Primary    Chronic Improved with use of trazodone EKG shows mild bradycardia, patient asymptomatic at this time.  Continue trazodone 25 to 50 mg by mouth as needed at bedtime      Relevant Orders   EKG 12-Lead   Night sweats    Chronic Improved with use of trazodone EKG shows mild bradycardia, patient asymptomatic at this time.  Did collaborate with supervising physician and with no abnormal findings noted on work up so far and with stable weights, recommendation is to just monitor. Per shared decision making hold off on CT scans, patient told to notify me if she starts to experience unintentional weight loss, exertional fatigue, chest pain, cardiac palpitations, etc to notify me. At which point  further evaluation will be completed.  Continue trazodone 25 to 50 mg by mouth as needed at bedtime      Screening for cardiovascular condition   Relevant Orders   EKG 12-Lead    Return in about 3 months (around 06/20/2023) for 3-6 months; F/U with Maralyn Sago.    Elenore Paddy, NP

## 2023-03-20 NOTE — Assessment & Plan Note (Signed)
Chronic ?Continue rosuvastatin 10 mg daily ?

## 2023-03-20 NOTE — Assessment & Plan Note (Addendum)
Chronic Improved with use of trazodone EKG shows mild bradycardia, patient asymptomatic at this time.  Continue trazodone 25 to 50 mg by mouth as needed at bedtime

## 2023-03-20 NOTE — Assessment & Plan Note (Addendum)
Chronic Continue amlodipine 10 mg/day, carvedilol 6.25 mg twice a day. EKG today shows mild sinus bradycardia, no arrhythmia. Patient is asymptomatic. No change to medication regimen recommended at this time. Patient educated to notify me if she starts to experience chest pain, exertional fatigue, SOB, cardiac palpitations, etc.  Follow-up with cardiology as scheduled.

## 2023-03-25 ENCOUNTER — Ambulatory Visit (HOSPITAL_BASED_OUTPATIENT_CLINIC_OR_DEPARTMENT_OTHER): Payer: Medicare HMO | Attending: Physical Medicine & Rehabilitation | Admitting: Physical Therapy

## 2023-03-25 ENCOUNTER — Encounter (HOSPITAL_BASED_OUTPATIENT_CLINIC_OR_DEPARTMENT_OTHER): Payer: Self-pay | Admitting: Physical Therapy

## 2023-03-25 DIAGNOSIS — M25512 Pain in left shoulder: Secondary | ICD-10-CM | POA: Insufficient documentation

## 2023-03-25 DIAGNOSIS — M25572 Pain in left ankle and joints of left foot: Secondary | ICD-10-CM | POA: Insufficient documentation

## 2023-03-25 DIAGNOSIS — M25552 Pain in left hip: Secondary | ICD-10-CM | POA: Insufficient documentation

## 2023-03-25 DIAGNOSIS — M25551 Pain in right hip: Secondary | ICD-10-CM | POA: Diagnosis not present

## 2023-03-25 DIAGNOSIS — M5459 Other low back pain: Secondary | ICD-10-CM

## 2023-03-25 DIAGNOSIS — R2681 Unsteadiness on feet: Secondary | ICD-10-CM

## 2023-03-25 NOTE — Therapy (Signed)
OUTPATIENT PHYSICAL THERAPY LOWER EXTREMITY TREATMENT   Patient Name: Leslie Griffin MRN: [id number] DOB:[birth year], 72 y.o., female Today's Date: 03/25/2023  END OF SESSION:  PT End of Session - 03/25/23 0958     Visit Number 2    Date for PT Re-Evaluation 04/29/23    Authorization Type [employer]    Authorization Time Period 03/18/23 to 04/29/23    Authorization - Visit Number 2    Progress Note Due on Visit 10    PT Start Time 0946    PT Stop Time 1025    PT Time Calculation (min) 39 min    Activity Tolerance Patient tolerated treatment well    Behavior During Therapy [facility] for tasks assessed/performed             Past Medical History:  Diagnosis Date   Allergic rhinitis    Anxiety    Arthritis    Family history of colonic polyps    Fibromyalgia    Heart murmur    History of colonic polyps    Hypercholesterolemia    Hypertension    Mitral valve prolapse    Ocular migraine    Palpitations    Past Surgical History:  Procedure Laterality Date   ABDOMINAL HYSTERECTOMY     age was in her 30s   COLONOSCOPY  10/05/2017   Mild sigmoid diverticulosis. Otherwise normal colonoscopy.    cyst removal arm Left 1981   from cat scratch fever   EYE SURGERY     bilateral cataract removal   FOOT ARTHRODESIS Left 11/01/2021   Procedure: LEFT TALONAVICULAR AND SUBTALAR FUSION;  Surgeon: Nadara Mustard, MD;  Location: [facility] OR;  Service: Orthopedics;  Laterality: Left;   Patient Active Problem List   Diagnosis Date Noted   Screening for cardiovascular condition 03/20/2023   Night sweats 03/06/2023   Fibromyalgia 10/28/2022   Osteoarthritis 10/28/2022   Constipation 09/04/2022   Need for vaccination 09/04/2022   Primary osteoarthritis of left shoulder 05/13/2022   Prediabetes 05/01/2022   Murmur 04/17/2022   Posterior tibial tendinitis, left leg    HLD (hyperlipidemia) 08/22/2021   Visual snow syndrome 12/30/2020   Arthropathy of lumbar facet joint 11/12/2020   Chest pain  06/14/2020   Dissociative episodes 06/14/2020   Laceration of right index finger without foreign body without damage to nail 04/03/2020   Multiple drug allergies 04/03/2020   B12 deficiency 03/13/2020   Generalized abdominal pain 03/13/2020   Low serum iron 10/03/2019   Low vitamin D level 10/03/2019   Ocular migraine 10/03/2019   Familial hypercholesterolemia 06/27/2019   Upper GI bleed 06/27/2019   Right foot pain 03/27/2019   Generalized pain 10/08/2018   Hypertension 08/18/2018   Diastolic dysfunction, left ventricle 08/18/2018   Family history of macular degeneration 08/18/2018   GERD (gastroesophageal reflux disease) 08/18/2018   History of cataract surgery 08/18/2018   History of migraine 08/18/2018   Hypertensive cardiomyopathy, without heart failure (HCC) 08/18/2018   Medication refill 08/18/2018   Mixed dyslipidemia 08/18/2018   Obesity, Class II, BMI 35-39.9 08/18/2018   Wellness examination 08/18/2018   Cardiomyopathy (HCC) 02/03/2018   Pain and swelling of ankle, right 01/27/2018   Left-sided low back pain without sciatica 12/04/2017   Fatigue 12/04/2017   Anxiety 11/04/2017   Arthralgia 11/04/2017   History of attempted suicide 11/04/2017   Insomnia 11/04/2017   Moderate episode of recurrent major depressive disorder (HCC) 11/04/2017    PCP: Jiles Prows, NP  REFERRING PROVIDER: Fanny Dance, MD  REFERRING DIAG: Fibromyalgia  THERAPY DIAG:  Pain of both hip joints  Unsteadiness on feet  Pain in left ankle and joints of left foot  Other low back pain  Rationale for Evaluation and Treatment: Rehabilitation  ONSET DATE: 30+ years   SUBJECTIVE:   SUBJECTIVE STATEMENT: Pt reports she is doing a little better since taking tumeric daily.  She states that she has a goal of returning to swimming laps in pool and walking without a cane.   PERTINENT HISTORY: Fibromyalgia, Lt foot arthrodesis 2022, bilateral cataract removal with vision issues anxiety,  Lt shoulder pain "several torn tendons" PAIN:  Are you having pain? Yes: NPRS scale: 5/10 Pain location: generalized - specific to Lt shoulder, Rt heel, Lt ankle, low back Pain description: constant Aggravating factors: walking, moving, any activity Relieving factors: nothing  PRECAUTIONS: None  WEIGHT BEARING RESTRICTIONS: No  FALLS:  Has patient fallen in last 6 months? No  LIVING ENVIRONMENT: Lives with: lives with their spouse Lives in: House/apartment Stairs: Yes: External: just a few into the house steps; can reach both Has following equipment at home: Single point cane  OCCUPATION: retired   PLOF: Independent  PATIENT GOALS: be able to walk a mile  NEXT MD VISIT: none noted   OBJECTIVE:   DIAGNOSTIC FINDINGS:   PATIENT SURVEYS:    COGNITION: Overall cognitive status: Within functional limits for tasks assessed     SENSATION: Not tested   MUSCLE LENGTH: Hamstrings: Right WNL deg; Left WNL deg Prone Ely's: WNL bilateral  POSTURE: rounded shoulders, forward head, and flexed trunk   PALPATION: Tenderness bilateral glutes   LOWER EXTREMITY ROM:  Passive ROM Right eval Left eval  Hip flexion WNL WNL  Hip extension 0 0  Hip abduction    Hip adduction    Hip internal rotation WNL WNL  Hip external rotation WNL WNL  Knee flexion    Knee extension    Ankle dorsiflexion    Ankle plantarflexion    Ankle inversion    Ankle eversion     (Blank rows = not tested)  LOWER EXTREMITY MMT:  MMT Right eval Left eval  Hip flexion 4 4  Hip extension 3 3  Hip abduction 3 3  Hip adduction    Hip internal rotation    Hip external rotation    Knee flexion    Knee extension 5 5  Ankle dorsiflexion 4 4  Ankle plantarflexion    Ankle inversion    Ankle eversion     (Blank rows = not tested)  LOWER EXTREMITY SPECIAL TESTS:    FUNCTIONAL TESTS:  5 times sit to stand: 9 sec 3 minute walk test: 480ft without AD   GAIT: Distance walked: 3  MWR Assistive device utilized: Single point cane and None Level of assistance: Modified independence Comments: decreased hip extension   TODAY'S TREATMENT:  DATE:  03/25/23 Pt seen for aquatic therapy today.  Treatment took place in water 3.5-4.75 ft in depth at the [facility] pool. Temp of water was 91.  Pt entered/exited the pool via stairs with step-to pattern independently with bilat rail. * intro to aquatic therapy principles, what to expect * unsupported (cues to have eyes on horizon to avoid dizziness): forward / backward walking -3 laps * unsupported - side stepping, then with added arm addct / abdct - 3 laps ; high knee marching forward 1 lap * holding wall:  3 way straight leg kick 2 x 5 * return to marching forward/ backward * with UE on rainbow hand floats -tandem gait forward/ backward  * staggered stance with bilat shoulder horiz abdct/add x 5 each LE forward; then 3 reps of shoulder abdct/addct * TrA set with short noodle pull down to thighs  * seated on bench: flutter kick and breast stroke kick  Pt requires the buoyancy and hydrostatic pressure of water for support, and to offload joints by unweighting joint load by at least 50 % in navel deep water and by at least 75-80% in chest to neck deep water.  Viscosity of the water is needed for resistance of strengthening. Water current perturbations provides challenge to standing balance requiring increased core activation.   PATIENT EDUCATION:  Education details: intro to aquatic therapy  Person educated: Patient Education method: Explanation Education comprehension: verbalized understanding and returned demonstration  HOME EXERCISE PROGRAM: Access Code: [email or web address] URL: [email or web address] Date: 03/18/2023 Prepared by: [facility] - [facility] - [facility]  Exercises - Beginner Bridge  - 1 x daily - 7 x weekly - 2 sets - 10 reps  ASSESSMENT:  CLINICAL IMPRESSION: Pt is confident in aquatic setting and able to take direction from therapist on deck.  She reported gradual elimination of pain while exercising in water. She did report some dizziness initially when walking while looking down at water; subsided while looking at horizon.  Pt would benefit from skilled PT to address her limitations in endurance, strength and flexibility in order to increase her independence and ability to participate in ADLs and caring for her family. Goals are ongoing.   OBJECTIVE IMPAIRMENTS: Abnormal gait, decreased activity tolerance, decreased balance, decreased endurance, decreased mobility, difficulty walking, decreased ROM, decreased strength, increased muscle spasms, impaired flexibility, improper body mechanics, and pain.   ACTIVITY LIMITATIONS: standing, squatting, sleeping, stairs, bed mobility, toileting, locomotion level, and caring for others  PARTICIPATION LIMITATIONS: meal prep, cleaning, laundry, shopping, community activity, yard work, and caring for husband  PERSONAL FACTORS: Age, Fitness, Past/current experiences, Time since onset of injury/illness/exacerbation, and 3+ comorbidities: Lt foot surgery/fusion, fibromyalgia, chronic back/hip pain  are also affecting patient's functional outcome.   REHAB POTENTIAL: Good  CLINICAL DECISION MAKING: Evolving/moderate complexity  EVALUATION COMPLEXITY: Moderate   GOALS: Goals reviewed with patient? Yes  SHORT TERM GOALS: Target date: 03/25/23 Pt will be independent with initial HEP and walking program. Baseline: Goal status: INITIAL     LONG TERM GOALS: Target date: 04/29/23  Pt will report atleast 30% improvement in activity participation from the start of PT. Baseline:  Goal status: INITIAL  2.  Pt will have increased LE strength to atleast 4/5 MMT to improve her efficiency with  ADLs. Baseline:  Goal status: INITIAL  3.  Pt will be able to walk up to 6 minutes during a session without the need for a rest break of significant increase in pain.  Baseline:  Goal status: INITIAL  4.  Pt will be independent with her advanced HEP to allow for further improvement in strength and endurance after discharge from PT.  Baseline:  Goal status: INITIAL     PLAN:  PT FREQUENCY: 2x/week  PT DURATION: 6 weeks  PLANNED INTERVENTIONS: Therapeutic exercises, Therapeutic activity, Neuromuscular re-education, Balance training, Gait training, Patient/Family education, Self Care, Joint mobilization, Stair training, Aquatic Therapy, Dry Needling, Electrical stimulation, Cryotherapy, Moist heat, Taping, Manual therapy, and Re-evaluation  PLAN FOR NEXT SESSION: consider DN for hip/back with TENS if pt agreeable; discuss beginning walking program at home to work towards personal goal of 1 mile; hip/trunk strengthening and update HEP as able  Mayer Camel, PTA 03/25/23 1:07 PM [facility] [address] [place], [place], [address] Phone: [phone]   Fax:  [phone]

## 2023-03-27 ENCOUNTER — Ambulatory Visit: Payer: Medicare HMO | Admitting: Physical Therapy

## 2023-03-27 DIAGNOSIS — M5459 Other low back pain: Secondary | ICD-10-CM | POA: Diagnosis not present

## 2023-03-27 DIAGNOSIS — M25572 Pain in left ankle and joints of left foot: Secondary | ICD-10-CM

## 2023-03-27 DIAGNOSIS — R2681 Unsteadiness on feet: Secondary | ICD-10-CM | POA: Diagnosis not present

## 2023-03-27 DIAGNOSIS — M25612 Stiffness of left shoulder, not elsewhere classified: Secondary | ICD-10-CM | POA: Diagnosis not present

## 2023-03-27 DIAGNOSIS — M25551 Pain in right hip: Secondary | ICD-10-CM | POA: Diagnosis not present

## 2023-03-27 DIAGNOSIS — M25512 Pain in left shoulder: Secondary | ICD-10-CM

## 2023-03-27 DIAGNOSIS — M25552 Pain in left hip: Secondary | ICD-10-CM | POA: Diagnosis not present

## 2023-03-27 DIAGNOSIS — M797 Fibromyalgia: Secondary | ICD-10-CM | POA: Diagnosis not present

## 2023-03-27 NOTE — Therapy (Signed)
OUTPATIENT PHYSICAL THERAPY LOWER EXTREMITY TREATMENT   Patient Name: Leslie Griffin MRN: [id number] DOB:03-Jul-1951, 72 y.o., female Today's Date: 03/27/2023  END OF SESSION:  PT End of Session - 03/27/23 0932     Visit Number 3    Date for PT Re-Evaluation 04/29/23    Authorization Type [employer] Medicare    Authorization Time Period 03/18/23 to 04/29/23    Authorization - Visit Number 3    Progress Note Due on Visit 10    PT Start Time 0932    PT Stop Time 1015    PT Time Calculation (min) 43 min    Activity Tolerance Patient tolerated treatment well    Behavior During Therapy [facility] for tasks assessed/performed              Past Medical History:  Diagnosis Date   Allergic rhinitis    Anxiety    Arthritis    Family history of colonic polyps    Fibromyalgia    Heart murmur    History of colonic polyps    Hypercholesterolemia    Hypertension    Mitral valve prolapse    Ocular migraine    Palpitations    Past Surgical History:  Procedure Laterality Date   ABDOMINAL HYSTERECTOMY     age was in her 30s   COLONOSCOPY  10/05/2017   Mild sigmoid diverticulosis. Otherwise normal colonoscopy.    cyst removal arm Left 1981   from cat scratch fever   EYE SURGERY     bilateral cataract removal   FOOT ARTHRODESIS Left 11/01/2021   Procedure: LEFT TALONAVICULAR AND SUBTALAR FUSION;  Surgeon: Nadara Mustard, MD;  Location: [facility] OR;  Service: Orthopedics;  Laterality: Left;   Patient Active Problem List   Diagnosis Date Noted   Screening for cardiovascular condition 03/20/2023   Night sweats 03/06/2023   Fibromyalgia 10/28/2022   Osteoarthritis 10/28/2022   Constipation 09/04/2022   Need for vaccination 09/04/2022   Primary osteoarthritis of left shoulder 05/13/2022   Prediabetes 05/01/2022   Murmur 04/17/2022   Posterior tibial tendinitis, left leg    HLD (hyperlipidemia) 08/22/2021   Visual snow syndrome 12/30/2020   Arthropathy of lumbar facet joint 11/12/2020   Chest  pain 06/14/2020   Dissociative episodes 06/14/2020   Laceration of right index finger without foreign body without damage to nail 04/03/2020   Multiple drug allergies 04/03/2020   B12 deficiency 03/13/2020   Generalized abdominal pain 03/13/2020   Low serum iron 10/03/2019   Low vitamin D level 10/03/2019   Ocular migraine 10/03/2019   Familial hypercholesterolemia 06/27/2019   Upper GI bleed 06/27/2019   Right foot pain 03/27/2019   Generalized pain 10/08/2018   Hypertension 08/18/2018   Diastolic dysfunction, left ventricle 08/18/2018   Family history of macular degeneration 08/18/2018   GERD (gastroesophageal reflux disease) 08/18/2018   History of cataract surgery 08/18/2018   History of migraine 08/18/2018   Hypertensive cardiomyopathy, without heart failure (HCC) 08/18/2018   Medication refill 08/18/2018   Mixed dyslipidemia 08/18/2018   Obesity, Class II, BMI 35-39.9 08/18/2018   Wellness examination 08/18/2018   Cardiomyopathy (HCC) 02/03/2018   Pain and swelling of ankle, right 01/27/2018   Left-sided low back pain without sciatica 12/04/2017   Fatigue 12/04/2017   Anxiety 11/04/2017   Arthralgia 11/04/2017   History of attempted suicide 11/04/2017   Insomnia 11/04/2017   Moderate episode of recurrent major depressive disorder (HCC) 11/04/2017    PCP: Jiles Prows, NP  REFERRING PROVIDER: Fanny Dance, MD  REFERRING DIAG: Fibromyalgia  THERAPY DIAG:  Pain of both hip joints  Unsteadiness on feet  Pain in left ankle and joints of left foot  Other low back pain  Acute pain of left shoulder  Stiffness of left shoulder, not elsewhere classified  Rationale for Evaluation and Treatment: Rehabilitation  ONSET DATE: 30+ years   SUBJECTIVE:   SUBJECTIVE STATEMENT: Pt states last night both of her legs her legs were throbbing/pulsing. She reports some soreness from work outs. Has not been able to do her HEP -- back has been too painful. Pain is calm  today.   PERTINENT HISTORY: Fibromyalgia, Lt foot arthrodesis 2022, bilateral cataract removal with vision issues anxiety, Lt shoulder pain "several torn tendons" PAIN:  Are you having pain? Yes: NPRS scale: 4/10 Pain location: generalized - specific to Lt shoulder, Rt heel, Lt ankle, low back Pain description: constant Aggravating factors: walking, moving, any activity Relieving factors: nothing  PRECAUTIONS: None  WEIGHT BEARING RESTRICTIONS: No  FALLS:  Has patient fallen in last 6 months? No  LIVING ENVIRONMENT: Lives with: lives with their spouse Lives in: House/apartment Stairs: Yes: External: just a few into the house steps; can reach both Has following equipment at home: Single point cane  OCCUPATION: retired   PLOF: Independent  PATIENT GOALS: be able to walk a mile  NEXT MD VISIT: none noted   OBJECTIVE: (Measures in this section from initial evaluation unless otherwise noted)  DIAGNOSTIC FINDINGS:   PATIENT SURVEYS:    COGNITION: Overall cognitive status: Within functional limits for tasks assessed     SENSATION: Not tested   MUSCLE LENGTH: Hamstrings: Right WNL deg; Left WNL deg Prone Ely's: WNL bilateral  POSTURE: rounded shoulders, forward head, and flexed trunk   PALPATION: Tenderness bilateral glutes   LOWER EXTREMITY ROM:  Passive ROM Right eval Left eval  Hip flexion WNL WNL  Hip extension 0 0  Hip abduction    Hip adduction    Hip internal rotation WNL WNL  Hip external rotation WNL WNL  Knee flexion    Knee extension    Ankle dorsiflexion    Ankle plantarflexion    Ankle inversion    Ankle eversion     (Blank rows = not tested)  LOWER EXTREMITY MMT:  MMT Right eval Left eval  Hip flexion 4 4  Hip extension 3 3  Hip abduction 3 3  Hip adduction    Hip internal rotation    Hip external rotation    Knee flexion    Knee extension 5 5  Ankle dorsiflexion 4 4  Ankle plantarflexion    Ankle inversion    Ankle  eversion     (Blank rows = not tested)  LOWER EXTREMITY SPECIAL TESTS:    FUNCTIONAL TESTS:  5 times sit to stand: 9 sec 3 minute walk test: 446ft without AD   GAIT: Distance walked: 3 MWR Assistive device utilized: Single point cane and None Level of assistance: Modified independence Comments: decreased hip extension   TODAY'S TREATMENT: [facility] Adult PT Treatment:                                                DATE: 03/27/23 Therapeutic Exercise: Hamstring sitting stretch x30 sec Supine hip flexor stretch x30 sec Supine diaphragmatic breathing with TSA contraction 10x5 sec Supine bridge 2x10 S/L clamshell 2x10 yellow TB  Standing glute set 2x10 Doorway pec stretch at 60 deg 2x30 sec Standing shoulder ext x10 yellow TB  Standing row 2x10 yellow TB Amb x5 min                                                                                                                                DATE:  03/25/23 Pt seen for aquatic therapy today.  Treatment took place in water 3.5-4.75 ft in depth at the [facility] pool. Temp of water was 91.  Pt entered/exited the pool via stairs with step-to pattern independently with bilat rail. * intro to aquatic therapy principles, what to expect * unsupported (cues to have eyes on horizon to avoid dizziness): forward / backward walking -3 laps * unsupported - side stepping, then with added arm addct / abdct - 3 laps ; high knee marching forward 1 lap * holding wall:  3 way straight leg kick 2 x 5 * return to marching forward/ backward * with UE on rainbow hand floats -tandem gait forward/ backward  * staggered stance with bilat shoulder horiz abdct/add x 5 each LE forward; then 3 reps of shoulder abdct/addct * TrA set with short noodle pull down to thighs  * seated on bench: flutter kick and breast stroke kick  Pt requires the buoyancy and hydrostatic pressure of water for support, and to offload joints by unweighting joint load by at least  50 % in navel deep water and by at least 75-80% in chest to neck deep water.  Viscosity of the water is needed for resistance of strengthening. Water current perturbations provides challenge to standing balance requiring increased core activation.   PATIENT EDUCATION:  Education details: intro to aquatic therapy  Person educated: Patient Education method: Explanation Education comprehension: verbalized understanding and returned demonstration  HOME EXERCISE PROGRAM: Access Code: [email or web address] URL: [email or web address] Date: 03/18/2023 Prepared by: [facility] - Outpatient Rehab - [facility]  Exercises - Beginner Bridge  - 1 x daily - 7 x weekly - 2 sets - 10 reps  ASSESSMENT:  CLINICAL IMPRESSION: Pt comes in with pain well controlled. Worked on further progressing hip strengthening. Provided more hip and core exercises with good pt tolerance.   OBJECTIVE IMPAIRMENTS: Abnormal gait, decreased activity tolerance, decreased balance, decreased endurance, decreased mobility, difficulty walking, decreased ROM, decreased strength, increased muscle spasms, impaired flexibility, improper body mechanics, and pain.    GOALS: Goals reviewed with patient? Yes  SHORT TERM GOALS: Target date: 03/25/23 Pt will be independent with initial HEP and walking program. Baseline: Goal status: INITIAL     LONG TERM GOALS: Target date: 04/29/23  Pt will report atleast 30% improvement in activity participation from the start of PT. Baseline:  Goal status: INITIAL  2.  Pt will have increased LE strength to atleast 4/5 MMT to improve her efficiency with ADLs. Baseline:  Goal status: INITIAL  3.  Pt will be able to walk up to  6 minutes during a session without the need for a rest break of significant increase in pain.  Baseline:  Goal status: INITIAL  4.  Pt will be independent with her advanced HEP to allow for further improvement in strength and endurance after discharge  from PT.  Baseline:  Goal status: INITIAL     PLAN:  PT FREQUENCY: 2x/week  PT DURATION: 6 weeks  PLANNED INTERVENTIONS: Therapeutic exercises, Therapeutic activity, Neuromuscular re-education, Balance training, Gait training, Patient/Family education, Self Care, Joint mobilization, Stair training, Aquatic Therapy, Dry Needling, Electrical stimulation, Cryotherapy, Moist heat, Taping, Manual therapy, and Re-evaluation  PLAN FOR NEXT SESSION: consider DN for hip/back with TENS if pt agreeable; discuss beginning walking program at home to work towards personal goal of 1 mile; hip/trunk strengthening and update HEP as able  [facility] April Ma L Reshonda Koerber, PT 03/27/23 9:32 AM

## 2023-04-01 ENCOUNTER — Ambulatory Visit (HOSPITAL_BASED_OUTPATIENT_CLINIC_OR_DEPARTMENT_OTHER): Payer: Medicare HMO | Admitting: Physical Therapy

## 2023-04-01 ENCOUNTER — Encounter (HOSPITAL_BASED_OUTPATIENT_CLINIC_OR_DEPARTMENT_OTHER): Payer: Self-pay | Admitting: Physical Therapy

## 2023-04-01 DIAGNOSIS — M25572 Pain in left ankle and joints of left foot: Secondary | ICD-10-CM

## 2023-04-01 DIAGNOSIS — R2681 Unsteadiness on feet: Secondary | ICD-10-CM | POA: Diagnosis not present

## 2023-04-01 DIAGNOSIS — M5459 Other low back pain: Secondary | ICD-10-CM | POA: Diagnosis not present

## 2023-04-01 DIAGNOSIS — M25551 Pain in right hip: Secondary | ICD-10-CM

## 2023-04-01 DIAGNOSIS — M25512 Pain in left shoulder: Secondary | ICD-10-CM

## 2023-04-01 DIAGNOSIS — M25552 Pain in left hip: Secondary | ICD-10-CM | POA: Diagnosis not present

## 2023-04-01 NOTE — Therapy (Signed)
OUTPATIENT PHYSICAL THERAPY LOWER EXTREMITY TREATMENT   Patient Name: Leslie Griffin MRN: [id number] DOB:01-Jul-1951, 72 y.o., female Today's Date: 04/01/2023  END OF SESSION:  PT End of Session - 04/01/23 0953     Visit Number 4    Date for PT Re-Evaluation 04/29/23    Authorization Type [employer] Medicare    Authorization Time Period 03/18/23 to 04/29/23    Authorization - Visit Number 4    Progress Note Due on Visit 10    PT Start Time 0946    PT Stop Time 1025    PT Time Calculation (min) 39 min    Behavior During Therapy [facility] for tasks assessed/performed              Past Medical History:  Diagnosis Date   Allergic rhinitis    Anxiety    Arthritis    Family history of colonic polyps    Fibromyalgia    Heart murmur    History of colonic polyps    Hypercholesterolemia    Hypertension    Mitral valve prolapse    Ocular migraine    Palpitations    Past Surgical History:  Procedure Laterality Date   ABDOMINAL HYSTERECTOMY     age was in her 30s   COLONOSCOPY  10/05/2017   Mild sigmoid diverticulosis. Otherwise normal colonoscopy.    cyst removal arm Left 1981   from cat scratch fever   EYE SURGERY     bilateral cataract removal   FOOT ARTHRODESIS Left 11/01/2021   Procedure: LEFT TALONAVICULAR AND SUBTALAR FUSION;  Surgeon: Nadara Mustard, MD;  Location: [facility] OR;  Service: Orthopedics;  Laterality: Left;   Patient Active Problem List   Diagnosis Date Noted   Screening for cardiovascular condition 03/20/2023   Night sweats 03/06/2023   Fibromyalgia 10/28/2022   Osteoarthritis 10/28/2022   Constipation 09/04/2022   Need for vaccination 09/04/2022   Primary osteoarthritis of left shoulder 05/13/2022   Prediabetes 05/01/2022   Murmur 04/17/2022   Posterior tibial tendinitis, left leg    HLD (hyperlipidemia) 08/22/2021   Visual snow syndrome 12/30/2020   Arthropathy of lumbar facet joint 11/12/2020   Chest pain 06/14/2020   Dissociative episodes 06/14/2020    Laceration of right index finger without foreign body without damage to nail 04/03/2020   Multiple drug allergies 04/03/2020   B12 deficiency 03/13/2020   Generalized abdominal pain 03/13/2020   Low serum iron 10/03/2019   Low vitamin D level 10/03/2019   Ocular migraine 10/03/2019   Familial hypercholesterolemia 06/27/2019   Upper GI bleed 06/27/2019   Right foot pain 03/27/2019   Generalized pain 10/08/2018   Hypertension 08/18/2018   Diastolic dysfunction, left ventricle 08/18/2018   Family history of macular degeneration 08/18/2018   GERD (gastroesophageal reflux disease) 08/18/2018   History of cataract surgery 08/18/2018   History of migraine 08/18/2018   Hypertensive cardiomyopathy, without heart failure (HCC) 08/18/2018   Medication refill 08/18/2018   Mixed dyslipidemia 08/18/2018   Obesity, Class II, BMI 35-39.9 08/18/2018   Wellness examination 08/18/2018   Cardiomyopathy (HCC) 02/03/2018   Pain and swelling of ankle, right 01/27/2018   Left-sided low back pain without sciatica 12/04/2017   Fatigue 12/04/2017   Anxiety 11/04/2017   Arthralgia 11/04/2017   History of attempted suicide 11/04/2017   Insomnia 11/04/2017   Moderate episode of recurrent major depressive disorder (HCC) 11/04/2017    PCP: Jiles Prows, NP  REFERRING PROVIDER: Fanny Dance, MD  REFERRING DIAG: Fibromyalgia  THERAPY DIAG:  Pain  of both hip joints  Unsteadiness on feet  Pain in left ankle and joints of left foot  Other low back pain  Acute pain of left shoulder  Rationale for Evaluation and Treatment: Rehabilitation  ONSET DATE: 30+ years   SUBJECTIVE:   SUBJECTIVE STATEMENT: "I couldn't move the next day after land exercise".   Pt reports she had pain for 2 days afterwards.  She went to pool 2 days ago and walked in water, as well as flutter kick and breast stroke.  She reported reduced pain after her time in pool, and she was able to walk to/from pool without cane.    PERTINENT HISTORY: Fibromyalgia, Lt foot arthrodesis 2022, bilateral cataract removal with vision issues anxiety, Lt shoulder pain "several torn tendons" PAIN:  Are you having pain? Yes: NPRS scale: 4/10 Pain location: generalized - specific to Lt shoulder, Rt heel, Lt ankle, low back Pain description: constant Aggravating factors: walking, moving, any activity Relieving factors: nothing  PRECAUTIONS: None  WEIGHT BEARING RESTRICTIONS: No  FALLS:  Has patient fallen in last 6 months? No  LIVING ENVIRONMENT: Lives with: lives with their spouse Lives in: House/apartment Stairs: Yes: External: just a few into the house steps; can reach both Has following equipment at home: Single point cane  OCCUPATION: retired   PLOF: Independent  PATIENT GOALS: be able to walk a mile  NEXT MD VISIT: none noted   OBJECTIVE: (Measures in this section from initial evaluation unless otherwise noted)  DIAGNOSTIC FINDINGS:   PATIENT SURVEYS:    COGNITION: Overall cognitive status: Within functional limits for tasks assessed     SENSATION: Not tested   MUSCLE LENGTH: Hamstrings: Right WNL deg; Left WNL deg Prone Ely's: WNL bilateral  POSTURE: rounded shoulders, forward head, and flexed trunk   PALPATION: Tenderness bilateral glutes   LOWER EXTREMITY ROM:  Passive ROM Right eval Left eval  Hip flexion WNL WNL  Hip extension 0 0  Hip abduction    Hip adduction    Hip internal rotation WNL WNL  Hip external rotation WNL WNL  Knee flexion    Knee extension    Ankle dorsiflexion    Ankle plantarflexion    Ankle inversion    Ankle eversion     (Blank rows = not tested)  LOWER EXTREMITY MMT:  MMT Right eval Left eval  Hip flexion 4 4  Hip extension 3 3  Hip abduction 3 3  Hip adduction    Hip internal rotation    Hip external rotation    Knee flexion    Knee extension 5 5  Ankle dorsiflexion 4 4  Ankle plantarflexion    Ankle inversion    Ankle eversion      (Blank rows = not tested)  LOWER EXTREMITY SPECIAL TESTS:    FUNCTIONAL TESTS:  5 times sit to stand: 9 sec 3 minute walk test: 459ft without AD   GAIT: Distance walked: 3 MWR Assistive device utilized: Single point cane and None Level of assistance: Modified independence Comments: decreased hip extension   TODAY'S TREATMENT: [facility] Adult PT Treatment:                                                DATE: 04/01/23 Pt seen for aquatic therapy today.  Treatment took place in water 3.5-4. ft in depth at the [facility] pool. Temp  of water was 91.  Pt entered/exited the pool via stairs with step-to pattern independently with bilat rail.  * unsupported: forward / backward walking -4 laps * unsupported - side stepping, then with added arm addct / abdct - 4 laps  * high knee marching forward 1 lap, then with reciprocal arm swing, then with alternating arm row with rainbow hand floats x 2 laps  * staggered stance with bilat shoulder horiz abdct/add x 5 each LE forward * standard stance with  shoulder abdct/addct with rainbow hand floats 2 x 5; then 3 side steps with shoulder addct with floats R/L * holding wall:  3 way straight leg kick 2 x 5 * TrA set with short noodle pull down to thighs x 5 -> hands on kick board x 5 * staggered stance with kick board row x 10 each * trial of lifting kickboard and noodle over head - painful, stopped after 1 rep each * wall push up x 5, trial of push off x 3 painful in Lt shoulder, stopped * return to walking forward with arm swing  Pt requires the buoyancy and hydrostatic pressure of water for support, and to offload joints by unweighting joint load by at least 50 % in navel deep water and by at least 75-80% in chest to neck deep water.  Viscosity of the water is needed for resistance of strengthening. Water current perturbations provides challenge to standing balance requiring increased core activation.   [facility] Adult PT Treatment:                                                 DATE: 03/27/23 Therapeutic Exercise: Hamstring sitting stretch x30 sec Supine hip flexor stretch x30 sec Supine diaphragmatic breathing with TSA contraction 10x5 sec Supine bridge 2x10 S/L clamshell 2x10 yellow TB Standing glute set 2x10 Doorway pec stretch at 60 deg 2x30 sec Standing shoulder ext x10 yellow TB  Standing row 2x10 yellow TB Amb x5 min   PATIENT EDUCATION:  Education details: aquatic therapy progressions  Person educated: Patient Education method: Explanation Education comprehension: verbalized understanding and returned demonstration  HOME EXERCISE PROGRAM: Access Code: [email or web address] URL: [email or web address] Date: 03/18/2023 Prepared by: [facility] - [facility] - [facility]  Access Code: [phone] URL: [email or web address] Date: 04/01/2023 Prepared by: [facility] - [facility]patient Re[facility] - [address] This aquatic home exercise program from Med[employer] utilizes pictures from land based exercises, but has been adapted prior to lamination and issuance.    ASSESSMENT:  CLINICAL IMPRESSION: Positive response to aquatic exercise thus far with report of overall decrease in pain.  Pt requests to hold land exercises for now and will continue aquatic therapy sessions until reassessment.  She reported some increase in Lt shoulder pain with overhead lift (of noodle/board) and with wall push off, but otherwise tolerated all other exercises without increase in pain.  Pt was issued laminated aquatic HEP to use at her pool; will progress in future sessions. Goals are ongoing.     OBJECTIVE IMPAIRMENTS: Abnormal gait, decreased activity tolerance, decreased balance, decreased endurance, decreased mobility, difficulty walking, decreased ROM, decreased strength, increased muscle spasms, impaired flexibility, improper body mechanics, and pain.    GOALS: Goals reviewed with patient? Yes  SHORT TERM GOALS:  Target date: 03/25/23 Pt will be independent with initial HEP and walking program. Baseline: not able to tolerate land  exercise and has not started walking program yet Goal status:ONGOING -04/01/23     LONG TERM GOALS: Target date: 04/29/23  Pt will report atleast 30% improvement in activity participation from the start of PT. Baseline:  Goal status: INITIAL  2.  Pt will have increased LE strength to atleast 4/5 MMT to improve her efficiency with ADLs. Baseline:  Goal status: INITIAL  3.  Pt will be able to walk up to 6 minutes during a session without the need for a rest break of significant increase in pain.  Baseline:  Goal status: INITIAL  4.  Pt will be independent with her advanced HEP to allow for further improvement in strength and endurance after discharge from PT.  Baseline:  Goal status: INITIAL     PLAN:  PT FREQUENCY: 2x/week  PT DURATION: 6 weeks  PLANNED INTERVENTIONS: Therapeutic exercises, Therapeutic activity, Neuromuscular re-education, Balance training, Gait training, Patient/Family education, Self Care, Joint mobilization, Stair training, Aquatic Therapy, Dry Needling, Electrical stimulation, Cryotherapy, Moist heat, Taping, Manual therapy, and Re-evaluation  PLAN FOR NEXT SESSION: consider DN for hip/back with TENS if pt agreeable; discuss beginning walking program at home to work towards personal goal of 1 mile; hip/trunk strengthening and update HEP as able  Mayer Camel, PTA 04/01/23 11:29 AM [facility] [address] [place], [place], [address] Phone: [phone]   Fax:  [phone]

## 2023-04-03 ENCOUNTER — Ambulatory Visit: Payer: Medicare HMO | Admitting: Physical Therapy

## 2023-04-08 ENCOUNTER — Encounter (HOSPITAL_BASED_OUTPATIENT_CLINIC_OR_DEPARTMENT_OTHER): Payer: Self-pay | Admitting: Physical Therapy

## 2023-04-08 ENCOUNTER — Ambulatory Visit (HOSPITAL_BASED_OUTPATIENT_CLINIC_OR_DEPARTMENT_OTHER): Payer: Medicare HMO | Admitting: Physical Therapy

## 2023-04-08 DIAGNOSIS — R2681 Unsteadiness on feet: Secondary | ICD-10-CM | POA: Diagnosis not present

## 2023-04-08 DIAGNOSIS — M25512 Pain in left shoulder: Secondary | ICD-10-CM

## 2023-04-08 DIAGNOSIS — M25552 Pain in left hip: Secondary | ICD-10-CM

## 2023-04-08 DIAGNOSIS — M25572 Pain in left ankle and joints of left foot: Secondary | ICD-10-CM

## 2023-04-08 DIAGNOSIS — M5459 Other low back pain: Secondary | ICD-10-CM

## 2023-04-08 DIAGNOSIS — M25551 Pain in right hip: Secondary | ICD-10-CM | POA: Diagnosis not present

## 2023-04-08 NOTE — Therapy (Signed)
OUTPATIENT PHYSICAL THERAPY LOWER EXTREMITY TREATMENT   Patient Name: Leslie Griffin MRN: [id number] DOB:Jun 23, 1951, 72 y.o., female Today's Date: 04/08/2023  END OF SESSION:  PT End of Session - 04/08/23 1036     Visit Number 5    Date for PT Re-Evaluation 04/29/23    Authorization Type [employer] Medicare    Authorization Time Period 03/18/23 to 04/29/23    Progress Note Due on Visit 10    PT Start Time 1033    PT Stop Time 1111    PT Time Calculation (min) 38 min    Behavior During Therapy [facility] for tasks assessed/performed              Past Medical History:  Diagnosis Date   Allergic rhinitis    Anxiety    Arthritis    Family history of colonic polyps    Fibromyalgia    Heart murmur    History of colonic polyps    Hypercholesterolemia    Hypertension    Mitral valve prolapse    Ocular migraine    Palpitations    Past Surgical History:  Procedure Laterality Date   ABDOMINAL HYSTERECTOMY     age was in her 30s   COLONOSCOPY  10/05/2017   Mild sigmoid diverticulosis. Otherwise normal colonoscopy.    cyst removal arm Left 1981   from cat scratch fever   EYE SURGERY     bilateral cataract removal   FOOT ARTHRODESIS Left 11/01/2021   Procedure: LEFT TALONAVICULAR AND SUBTALAR FUSION;  Surgeon: Nadara Mustard, MD;  Location: [facility] OR;  Service: Orthopedics;  Laterality: Left;   Patient Active Problem List   Diagnosis Date Noted   Screening for cardiovascular condition 03/20/2023   Night sweats 03/06/2023   Fibromyalgia 10/28/2022   Osteoarthritis 10/28/2022   Constipation 09/04/2022   Need for vaccination 09/04/2022   Primary osteoarthritis of left shoulder 05/13/2022   Prediabetes 05/01/2022   Murmur 04/17/2022   Posterior tibial tendinitis, left leg    HLD (hyperlipidemia) 08/22/2021   Visual snow syndrome 12/30/2020   Arthropathy of lumbar facet joint 11/12/2020   Chest pain 06/14/2020   Dissociative episodes 06/14/2020   Laceration of right index finger  without foreign body without damage to nail 04/03/2020   Multiple drug allergies 04/03/2020   B12 deficiency 03/13/2020   Generalized abdominal pain 03/13/2020   Low serum iron 10/03/2019   Low vitamin D level 10/03/2019   Ocular migraine 10/03/2019   Familial hypercholesterolemia 06/27/2019   Upper GI bleed 06/27/2019   Right foot pain 03/27/2019   Generalized pain 10/08/2018   Hypertension 08/18/2018   Diastolic dysfunction, left ventricle 08/18/2018   Family history of macular degeneration 08/18/2018   GERD (gastroesophageal reflux disease) 08/18/2018   History of cataract surgery 08/18/2018   History of migraine 08/18/2018   Hypertensive cardiomyopathy, without heart failure (HCC) 08/18/2018   Medication refill 08/18/2018   Mixed dyslipidemia 08/18/2018   Obesity, Class II, BMI 35-39.9 08/18/2018   Wellness examination 08/18/2018   Cardiomyopathy (HCC) 02/03/2018   Pain and swelling of ankle, right 01/27/2018   Left-sided low back pain without sciatica 12/04/2017   Fatigue 12/04/2017   Anxiety 11/04/2017   Arthralgia 11/04/2017   History of attempted suicide 11/04/2017   Insomnia 11/04/2017   Moderate episode of recurrent major depressive disorder (HCC) 11/04/2017    PCP: Jiles Prows, NP  REFERRING PROVIDER: Fanny Dance, MD  REFERRING DIAG: Fibromyalgia  THERAPY DIAG:  Pain of both hip joints  Unsteadiness on feet  Pain in left ankle and joints of left foot  Other low back pain  Acute pain of left shoulder  Rationale for Evaluation and Treatment: Rehabilitation  ONSET DATE: 30+ years   SUBJECTIVE:   SUBJECTIVE STATEMENT: Pt reports she has had LE cramps in the night that wake her up, after each time in the pool.  She takes mustard and this helps.  She reports she feels she walks better after being in the pool. Pt reports she has been working on reciprocal pattern on stairs.   PERTINENT HISTORY: Fibromyalgia, Lt foot arthrodesis 2022, bilateral  cataract removal with vision issues anxiety, Lt shoulder pain "several torn tendons" PAIN:  Are you having pain? Yes: NPRS scale: 5/10 Pain location: generalized - specific to Lt shoulder, Rt heel, Lt ankle Pain description: constant Aggravating factors: walking, moving, any activity Relieving factors: nothing  PRECAUTIONS: None  WEIGHT BEARING RESTRICTIONS: No  FALLS:  Has patient fallen in last 6 months? No  LIVING ENVIRONMENT: Lives with: lives with their spouse Lives in: House/apartment Stairs: Yes: External: just a few into the house steps; can reach both Has following equipment at home: Single point cane  OCCUPATION: retired   PLOF: Independent  PATIENT GOALS: be able to walk a mile  NEXT MD VISIT: none noted   OBJECTIVE: (Measures in this section from initial evaluation unless otherwise noted)  DIAGNOSTIC FINDINGS:   PATIENT SURVEYS:    COGNITION: Overall cognitive status: Within functional limits for tasks assessed     SENSATION: Not tested   MUSCLE LENGTH: Hamstrings: Right WNL deg; Left WNL deg Prone Ely's: WNL bilateral  POSTURE: rounded shoulders, forward head, and flexed trunk   PALPATION: Tenderness bilateral glutes   LOWER EXTREMITY ROM:  Passive ROM Right eval Left eval  Hip flexion WNL WNL  Hip extension 0 0  Hip abduction    Hip adduction    Hip internal rotation WNL WNL  Hip external rotation WNL WNL  Knee flexion    Knee extension    Ankle dorsiflexion    Ankle plantarflexion    Ankle inversion    Ankle eversion     (Blank rows = not tested)  LOWER EXTREMITY MMT:  MMT Right eval Left eval  Hip flexion 4 4  Hip extension 3 3  Hip abduction 3 3  Hip adduction    Hip internal rotation    Hip external rotation    Knee flexion    Knee extension 5 5  Ankle dorsiflexion 4 4  Ankle plantarflexion    Ankle inversion    Ankle eversion     (Blank rows = not tested)  LOWER EXTREMITY SPECIAL TESTS:    FUNCTIONAL  TESTS:  5 times sit to stand: 9 sec 3 minute walk test: 458ft without AD   GAIT: Distance walked: 3 MWR Assistive device utilized: Single point cane and None Level of assistance: Modified independence Comments: decreased hip extension   TODAY'S TREATMENT: [facility] Adult PT Treatment:                                                DATE: 04/08/23 Pt seen for aquatic therapy today.  Treatment took place in water 3.5-4. ft in depth at the [facility] pool. Temp of water was 91.  Pt entered/exited the pool via stairs with step-to pattern independently with bilat rail.  *  unsupported: forward / backward walking -4 laps * unsupported - side stepping, then with added arm addct / abdct - 4 laps  * wall push up/off with cues for form x 8 * short yellow noodle pull down with TrA set  * high knee marching backward /forward with alternating arm row with rainbow hand floats x 2 laps  * staggered stance with bilat shoulder horiz abdct/add x 5 each LE forward; tricep push down x 10; shoulder addct/abdct x 10 * return to walking forward/backward with arm swing * staggered stance with kick board row x 10 each * holding yellow hand floats:  tandem gait forward/backward with cues to keep knees relaxed;   3 way straight leg kick 2 x 5 * vertical squats pushing yellow hand float under water x 5 (mindful of ankle position)  Pt requires the buoyancy and hydrostatic pressure of water for support, and to offload joints by unweighting joint load by at least 50 % in navel deep water and by at least 75-80% in chest to neck deep water.  Viscosity of the water is needed for resistance of strengthening. Water current perturbations provides challenge to standing balance requiring increased core activation.  [facility] Adult PT Treatment:                                                DATE: 04/01/23 Pt seen for aquatic therapy today.  Treatment took place in water 3.5-4. ft in depth at the [facility] pool. Temp  of water was 91.  Pt entered/exited the pool via stairs with step-to pattern independently with bilat rail.  * unsupported: forward / backward walking -4 laps * unsupported - side stepping, then with added arm addct / abdct - 4 laps  * high knee marching forward 1 lap, then with reciprocal arm swing, then with alternating arm row with rainbow hand floats x 2 laps  * staggered stance with bilat shoulder horiz abdct/add x 5 each LE forward * standard stance with  shoulder abdct/addct with rainbow hand floats 2 x 5; then 3 side steps with shoulder addct with floats R/L * holding wall:  3 way straight leg kick 2 x 5 * TrA set with short noodle pull down to thighs x 5 -> hands on kick board x 5 * staggered stance with kick board row x 10 each * trial of lifting kickboard and noodle over head - painful, stopped after 1 rep each * wall push up x 5, trial of push off x 3 painful in Lt shoulder, stopped * return to walking forward with arm swing  Pt requires the buoyancy and hydrostatic pressure of water for support, and to offload joints by unweighting joint load by at least 50 % in navel deep water and by at least 75-80% in chest to neck deep water.  Viscosity of the water is needed for resistance of strengthening. Water current perturbations provides challenge to standing balance requiring increased core activation.   [facility] Adult PT Treatment:                                                DATE: 03/27/23 Therapeutic Exercise: Hamstring sitting stretch x30 sec Supine hip flexor stretch x30 sec Supine  diaphragmatic breathing with TSA contraction 10x5 sec Supine bridge 2x10 S/L clamshell 2x10 yellow TB Standing glute set 2x10 Doorway pec stretch at 60 deg 2x30 sec Standing shoulder ext x10 yellow TB  Standing row 2x10 yellow TB Amb x5 min   PATIENT EDUCATION:  Education details: aquatic therapy progressions /modifications Person educated: Patient Education method: Explanation Education  comprehension: verbalized understanding and returned demonstration  HOME EXERCISE PROGRAM: Access Code: [email or web address] URL: [email or web address] Date: 03/18/2023 Prepared by: [facility] - Outpatient Rehab - [facility]  Aquatic HEP  Access Code: [phone] URL: [email or web address] Date: 04/01/2023 Prepared by: [facility] - [facility]patient Re[facility] - [address] This aquatic home exercise program from [employer] utilizes pictures from land based exercises, but has been adapted prior to lamination and issuance.    ASSESSMENT:  CLINICAL IMPRESSION: Pt reported slight reduction of pain in Lt shoulder and ankle to 3/10 during session.   Pt was re-issued laminated aquatic HEP to use at her pool as she lost her first one. Plan to progress in future sessions. Goals are ongoing.     OBJECTIVE IMPAIRMENTS: Abnormal gait, decreased activity tolerance, decreased balance, decreased endurance, decreased mobility, difficulty walking, decreased ROM, decreased strength, increased muscle spasms, impaired flexibility, improper body mechanics, and pain.    GOALS: Goals reviewed with patient? Yes  SHORT TERM GOALS: Target date: 03/25/23 Pt will be independent with initial HEP and walking program. Baseline: not able to tolerate land exercise and has not started walking program yet Goal status:ONGOING -04/01/23     LONG TERM GOALS: Target date: 04/29/23  Pt will report atleast 30% improvement in activity participation from the start of PT. Baseline:  Goal status: ONGOING- 04/08/23  2.  Pt will have increased LE strength to atleast 4/5 MMT to improve her efficiency with ADLs. Baseline:  Goal status: INITIAL  3.  Pt will be able to walk up to 6 minutes during a session without the need for a rest break of significant increase in pain.  Baseline:  Goal status: Partially Met - 5/22 (able to complete in water)  4.  Pt will be independent with her advanced HEP to allow for  further improvement in strength and endurance after discharge from PT.  Baseline:  Goal status: INITIAL     PLAN:  PT FREQUENCY: 2x/week  PT DURATION: 6 weeks  PLANNED INTERVENTIONS: Therapeutic exercises, Therapeutic activity, Neuromuscular re-education, Balance training, Gait training, Patient/Family education, Self Care, Joint mobilization, Stair training, Aquatic Therapy, Dry Needling, Electrical stimulation, Cryotherapy, Moist heat, Taping, Manual therapy, and Re-evaluation  PLAN FOR NEXT SESSION: on land: consider DN for hip/back with TENS if pt agreeable; discuss beginning walking program at home to work towards personal goal of 1 mile; hip/trunk strengthening and update HEP as able  Mayer Camel, PTA 04/08/23 12:10 PM [facility] [address] [place], [place], [address] Phone: [phone]   Fax:  [phone]

## 2023-04-10 ENCOUNTER — Encounter: Payer: Medicare HMO | Admitting: Physical Therapy

## 2023-04-14 ENCOUNTER — Encounter (HOSPITAL_BASED_OUTPATIENT_CLINIC_OR_DEPARTMENT_OTHER): Payer: Self-pay | Admitting: Physical Therapy

## 2023-04-15 ENCOUNTER — Encounter (HOSPITAL_BASED_OUTPATIENT_CLINIC_OR_DEPARTMENT_OTHER): Payer: Self-pay | Admitting: Physical Therapy

## 2023-04-15 ENCOUNTER — Ambulatory Visit (HOSPITAL_BASED_OUTPATIENT_CLINIC_OR_DEPARTMENT_OTHER): Payer: Medicare HMO | Admitting: Physical Therapy

## 2023-04-15 DIAGNOSIS — M25572 Pain in left ankle and joints of left foot: Secondary | ICD-10-CM

## 2023-04-15 DIAGNOSIS — M25512 Pain in left shoulder: Secondary | ICD-10-CM | POA: Diagnosis not present

## 2023-04-15 DIAGNOSIS — R2681 Unsteadiness on feet: Secondary | ICD-10-CM | POA: Diagnosis not present

## 2023-04-15 DIAGNOSIS — M25551 Pain in right hip: Secondary | ICD-10-CM

## 2023-04-15 DIAGNOSIS — M5459 Other low back pain: Secondary | ICD-10-CM | POA: Diagnosis not present

## 2023-04-15 DIAGNOSIS — M25552 Pain in left hip: Secondary | ICD-10-CM | POA: Diagnosis not present

## 2023-04-15 NOTE — Therapy (Signed)
OUTPATIENT PHYSICAL THERAPY LOWER EXTREMITY TREATMENT   Patient Name: Leslie Griffin MRN: [id number] DOB:[birth year], 72 y.o., female Today's Date: 04/15/2023  END OF SESSION:  PT End of Session - 04/15/23 0956     Visit Number 6    Date for PT Re-Evaluation 04/29/23    Authorization Type [employer]    Authorization Time Period 03/18/23 to 04/29/23    Authorization - Visit Number 6    Progress Note Due on Visit 10    PT Start Time 0947    PT Stop Time 1025    PT Time Calculation (min) 38 min    Activity Tolerance Patient tolerated treatment well    Behavior During Therapy [facility] for tasks assessed/performed              Past Medical History:  Diagnosis Date   Allergic rhinitis    Anxiety    Arthritis    Family history of colonic polyps    Fibromyalgia    Heart murmur    History of colonic polyps    Hypercholesterolemia    Hypertension    Mitral valve prolapse    Ocular migraine    Palpitations    Past Surgical History:  Procedure Laterality Date   ABDOMINAL HYSTERECTOMY     age was in her 30s   COLONOSCOPY  10/05/2017   Mild sigmoid diverticulosis. Otherwise normal colonoscopy.    cyst removal arm Left 1981   from cat scratch fever   EYE SURGERY     bilateral cataract removal   FOOT ARTHRODESIS Left 11/01/2021   Procedure: LEFT TALONAVICULAR AND SUBTALAR FUSION;  Surgeon: Nadara Mustard, MD;  Location: [facility] OR;  Service: Orthopedics;  Laterality: Left;   Patient Active Problem List   Diagnosis Date Noted   Screening for cardiovascular condition 03/20/2023   Night sweats 03/06/2023   Fibromyalgia 10/28/2022   Osteoarthritis 10/28/2022   Constipation 09/04/2022   Need for vaccination 09/04/2022   Primary osteoarthritis of left shoulder 05/13/2022   Prediabetes 05/01/2022   Murmur 04/17/2022   Posterior tibial tendinitis, left leg    HLD (hyperlipidemia) 08/22/2021   Visual snow syndrome 12/30/2020   Arthropathy of lumbar facet joint 11/12/2020   Chest  pain 06/14/2020   Dissociative episodes 06/14/2020   Laceration of right index finger without foreign body without damage to nail 04/03/2020   Multiple drug allergies 04/03/2020   B12 deficiency 03/13/2020   Generalized abdominal pain 03/13/2020   Low serum iron 10/03/2019   Low vitamin D level 10/03/2019   Ocular migraine 10/03/2019   Familial hypercholesterolemia 06/27/2019   Upper GI bleed 06/27/2019   Right foot pain 03/27/2019   Generalized pain 10/08/2018   Hypertension 08/18/2018   Diastolic dysfunction, left ventricle 08/18/2018   Family history of macular degeneration 08/18/2018   GERD (gastroesophageal reflux disease) 08/18/2018   History of cataract surgery 08/18/2018   History of migraine 08/18/2018   Hypertensive cardiomyopathy, without heart failure (HCC) 08/18/2018   Medication refill 08/18/2018   Mixed dyslipidemia 08/18/2018   Obesity, Class II, BMI 35-39.9 08/18/2018   Wellness examination 08/18/2018   Cardiomyopathy (HCC) 02/03/2018   Pain and swelling of ankle, right 01/27/2018   Left-sided low back pain without sciatica 12/04/2017   Fatigue 12/04/2017   Anxiety 11/04/2017   Arthralgia 11/04/2017   History of attempted suicide 11/04/2017   Insomnia 11/04/2017   Moderate episode of recurrent major depressive disorder (HCC) 11/04/2017    PCP: Jiles Prows, NP  REFERRING PROVIDER: Fanny Dance, MD  REFERRING DIAG: Fibromyalgia  THERAPY DIAG:  Pain in left ankle and joints of left foot  Unsteadiness on feet  Other low back pain  Pain of both hip joints  Rationale for Evaluation and Treatment: Rehabilitation  ONSET DATE: 30+ years   SUBJECTIVE:   SUBJECTIVE STATEMENT: Pt reports she believes the recent storms (and change in barometric pressure), along with walking longer distances, was the cause of her flare up in pain.  Today she woke up feeling "wonderful".     PERTINENT HISTORY: Fibromyalgia, Lt foot arthrodesis 2022, bilateral cataract  removal with vision issues anxiety, Lt shoulder pain "several torn tendons" PAIN:  Are you having pain? Yes: NPRS scale: 3/10 Pain location: generalized - specific to Rt heel, Lt ankle Pain description: ache Aggravating factors: walking, moving, any activity Relieving factors: nothing  PRECAUTIONS: None  WEIGHT BEARING RESTRICTIONS: No  FALLS:  Has patient fallen in last 6 months? No  LIVING ENVIRONMENT: Lives with: lives with their spouse Lives in: House/apartment Stairs: Yes: External: just a few into the house steps; can reach both Has following equipment at home: Single point cane  OCCUPATION: retired   PLOF: Independent  PATIENT GOALS: be able to walk a mile  NEXT MD VISIT: none noted   OBJECTIVE: (Measures in this section from initial evaluation unless otherwise noted)  DIAGNOSTIC FINDINGS:   PATIENT SURVEYS:    COGNITION: Overall cognitive status: Within functional limits for tasks assessed     SENSATION: Not tested   MUSCLE LENGTH: Hamstrings: Right WNL deg; Left WNL deg Prone Ely's: WNL bilateral  POSTURE: rounded shoulders, forward head, and flexed trunk   PALPATION: Tenderness bilateral glutes   LOWER EXTREMITY ROM:  Passive ROM Right eval Left eval  Hip flexion WNL WNL  Hip extension 0 0  Hip abduction    Hip adduction    Hip internal rotation WNL WNL  Hip external rotation WNL WNL  Knee flexion    Knee extension    Ankle dorsiflexion    Ankle plantarflexion    Ankle inversion    Ankle eversion     (Blank rows = not tested)  LOWER EXTREMITY MMT:  MMT Right eval Left eval  Hip flexion 4 4  Hip extension 3 3  Hip abduction 3 3  Hip adduction    Hip internal rotation    Hip external rotation    Knee flexion    Knee extension 5 5  Ankle dorsiflexion 4 4  Ankle plantarflexion    Ankle inversion    Ankle eversion     (Blank rows = not tested)  LOWER EXTREMITY SPECIAL TESTS:    FUNCTIONAL TESTS:  5 times sit to stand:  9 sec 3 minute walk test: 451ft without AD   GAIT: Distance walked: 3 MWR Assistive device utilized: Single point cane and None Level of assistance: Modified independence Comments: decreased hip extension   TODAY'S TREATMENT: [facility] Adult PT Treatment:                                                DATE: 04/15/23 Pt seen for aquatic therapy today.  Treatment took place in water 3.5-4. ft in depth at the [facility] pool. Temp of water was 91.  Pt entered/exited the pool via stairs with step-to pattern independently with bilat rail.  * unsupported: forward / backward walking -4 laps *  unsupported - side stepping, then with added arm addct / abdct - 4 laps  * staggered stance with bilat shoulder horiz abdct/add x 5 each LE forward, tricep press downs with rainbow hand floats x 5 each;  kick board row x 5 each LE x 2 ( increased speed 2nd set)  * standard stance with bilat shoulder flex/ext with palms facing forward x10 * hip hinge with forward arm reach, UE on kick board  x 10   * high knee marching backward /forward with alternating arm row with rainbow hand floats x 2 laps  * short yellow noodle pull down with TrA set x 10 * holding yellow hand floats: 3 way straight leg kick x 10 each tandem gait forward/backward with cues for vertical trunk and to slow speed, 2nd rep with circumducting LE;   tandem stance with horiz head turns working on gaze stabilization  Pt requires the buoyancy and hydrostatic pressure of water for support, and to offload joints by unweighting joint load by at least 50 % in navel deep water and by at least 75-80% in chest to neck deep water.  Viscosity of the water is needed for resistance of strengthening. Water current perturbations provides challenge to standing balance requiring increased core activation.   [facility] Adult PT Treatment:                                                DATE: 04/08/23 Pt seen for aquatic therapy today.  Treatment took place in water  3.5-4. ft in depth at the [facility] pool. Temp of water was 91.  Pt entered/exited the pool via stairs with step-to pattern independently with bilat rail.  * unsupported: forward / backward walking -4 laps * unsupported - side stepping, then with added arm addct / abdct - 4 laps  * wall push up/off with cues for form x 8 * short yellow noodle pull down with TrA set  * high knee marching backward /forward with alternating arm row with rainbow hand floats x 2 laps  * staggered stance with bilat shoulder horiz abdct/add x 5 each LE forward; tricep push down x 10; shoulder addct/abdct x 10 * return to walking forward/backward with arm swing * staggered stance with kick board row x 10 each * holding yellow hand floats:  tandem gait forward/backward with cues to keep knees relaxed;   3 way straight leg kick 2 x 5 * vertical squats pushing yellow hand float under water x 5 (mindful of ankle position)  Pt requires the buoyancy and hydrostatic pressure of water for support, and to offload joints by unweighting joint load by at least 50 % in navel deep water and by at least 75-80% in chest to neck deep water.  Viscosity of the water is needed for resistance of strengthening. Water current perturbations provides challenge to standing balance requiring increased core activation.  [facility] Adult PT Treatment:                                                DATE: 04/01/23 Pt seen for aquatic therapy today.  Treatment took place in water 3.5-4. ft in depth at the [facility] pool. Temp of water was 91.  Pt  entered/exited the pool via stairs with step-to pattern independently with bilat rail.  * unsupported: forward / backward walking -4 laps * unsupported - side stepping, then with added arm addct / abdct - 4 laps  * high knee marching forward 1 lap, then with reciprocal arm swing, then with alternating arm row with rainbow hand floats x 2 laps  * staggered stance with bilat shoulder horiz  abdct/add x 5 each LE forward * standard stance with  shoulder abdct/addct with rainbow hand floats 2 x 5; then 3 side steps with shoulder addct with floats R/L * holding wall:  3 way straight leg kick 2 x 5 * TrA set with short noodle pull down to thighs x 5 -> hands on kick board x 5 * staggered stance with kick board row x 10 each * trial of lifting kickboard and noodle over head - painful, stopped after 1 rep each * wall push up x 5, trial of push off x 3 painful in Lt shoulder, stopped * return to walking forward with arm swing  Pt requires the buoyancy and hydrostatic pressure of water for support, and to offload joints by unweighting joint load by at least 50 % in navel deep water and by at least 75-80% in chest to neck deep water.  Viscosity of the water is needed for resistance of strengthening. Water current perturbations provides challenge to standing balance requiring increased core activation.   [facility] Adult PT Treatment:                                                DATE: 03/27/23 Therapeutic Exercise: Hamstring sitting stretch x30 sec Supine hip flexor stretch x30 sec Supine diaphragmatic breathing with TSA contraction 10x5 sec Supine bridge 2x10 S/L clamshell 2x10 yellow TB Standing glute set 2x10 Doorway pec stretch at 60 deg 2x30 sec Standing shoulder ext x10 yellow TB  Standing row 2x10 yellow TB Amb x5 min   PATIENT EDUCATION:  Education details: aquatic therapy progressions /modifications Person educated: Patient Education method: Explanation Education comprehension: verbalized understanding and returned demonstration  HOME EXERCISE PROGRAM: Access Code: [email or web address] URL: [email or web address] Date: 03/18/2023 Prepared by: [facility] - [facility]  Aquatic HEP  Access Code: [phone] URL: [email or web address] Date: 04/01/2023 Prepared by: [facility] - [address] This aquatic home  exercise program from MedBridge utilizes pictures from land based exercises, but has been adapted prior to lamination and issuance.    ASSESSMENT:  CLINICAL IMPRESSION: Pt reported reduction of pain in Lt ankle to 0/10 during session, but Rt foot pain increased (at area of navicular) to 3/10. Discussed possible investment into supportive water shoes for cushion to Rt foot.  Kept exercises similar to assess for improvement in evening following session.  Plan to progress in future sessions.  Pt reports some dizziness with horiz head turns when in tandem stance; encouraged to hold position until dizziness subsides. Pt making gradual progress towards goals.     OBJECTIVE IMPAIRMENTS: Abnormal gait, decreased activity tolerance, decreased balance, decreased endurance, decreased mobility, difficulty walking, decreased ROM, decreased strength, increased muscle spasms, impaired flexibility, improper body mechanics, and pain.    GOALS: Goals reviewed with patient? Yes  SHORT TERM GOALS: Target date: 03/25/23 Pt will be independent with initial HEP and walking program. Baseline: not able to tolerate land exercise and has  not started walking program yet Goal status:ONGOING -04/01/23     LONG TERM GOALS: Target date: 04/29/23  Pt will report atleast 30% improvement in activity participation from the start of PT. Baseline:  Goal status: ONGOING- 04/08/23  2.  Pt will have increased LE strength to atleast 4/5 MMT to improve her efficiency with ADLs. Baseline:  Goal status: INITIAL  3.  Pt will be able to walk up to 6 minutes during a session without the need for a rest break of significant increase in pain.  Baseline:  Goal status: Partially Met - 5/22 (able to complete in water)  4.  Pt will be independent with her advanced HEP to allow for further improvement in strength and endurance after discharge from PT.  Baseline:  Goal status: INITIAL     PLAN:  PT FREQUENCY: 2x/week  PT  DURATION: 6 weeks  PLANNED INTERVENTIONS: Therapeutic exercises, Therapeutic activity, Neuromuscular re-education, Balance training, Gait training, Patient/Family education, Self Care, Joint mobilization, Stair training, Aquatic Therapy, Dry Needling, Electrical stimulation, Cryotherapy, Moist heat, Taping, Manual therapy, and Re-evaluation  PLAN FOR NEXT SESSION: on land: consider DN for hip/back with TENS if pt agreeable; hip/trunk strengthening and update HEP as able  Mayer Camel, PTA 04/15/23 12:34 PM [facility] [address] [place], [place], [address] Phone: [phone]   Fax:  [phone]

## 2023-04-17 ENCOUNTER — Encounter: Payer: Medicare HMO | Admitting: Physical Therapy

## 2023-04-22 ENCOUNTER — Ambulatory Visit (HOSPITAL_BASED_OUTPATIENT_CLINIC_OR_DEPARTMENT_OTHER): Payer: Medicare HMO | Attending: Physical Medicine & Rehabilitation | Admitting: Physical Therapy

## 2023-04-22 ENCOUNTER — Encounter (HOSPITAL_BASED_OUTPATIENT_CLINIC_OR_DEPARTMENT_OTHER): Payer: Self-pay | Admitting: Physical Therapy

## 2023-04-22 DIAGNOSIS — M25572 Pain in left ankle and joints of left foot: Secondary | ICD-10-CM | POA: Insufficient documentation

## 2023-04-22 DIAGNOSIS — R2681 Unsteadiness on feet: Secondary | ICD-10-CM | POA: Diagnosis not present

## 2023-04-22 DIAGNOSIS — M25551 Pain in right hip: Secondary | ICD-10-CM | POA: Insufficient documentation

## 2023-04-22 DIAGNOSIS — M5459 Other low back pain: Secondary | ICD-10-CM | POA: Diagnosis not present

## 2023-04-22 DIAGNOSIS — M25512 Pain in left shoulder: Secondary | ICD-10-CM | POA: Diagnosis not present

## 2023-04-22 DIAGNOSIS — M25552 Pain in left hip: Secondary | ICD-10-CM | POA: Insufficient documentation

## 2023-04-22 NOTE — Therapy (Signed)
OUTPATIENT PHYSICAL THERAPY LOWER EXTREMITY TREATMENT   Patient Name: Leslie Griffin MRN: [id number] DOB:[birth year], 72 y.o., female Today's Date: 04/22/2023  END OF SESSION:  PT End of Session - 04/22/23 0957     Visit Number 7    Date for PT Re-Evaluation 04/29/23    Authorization Type [employer]    Authorization Time Period 03/18/23 to 04/29/23    Authorization - Visit Number 7    Progress Note Due on Visit 10    PT Start Time 0947    PT Stop Time 1028    PT Time Calculation (min) 41 min    Activity Tolerance Patient tolerated treatment well    Behavior During Therapy [facility] for tasks assessed/performed              Past Medical History:  Diagnosis Date   Allergic rhinitis    Anxiety    Arthritis    Family history of colonic polyps    Fibromyalgia    Heart murmur    History of colonic polyps    Hypercholesterolemia    Hypertension    Mitral valve prolapse    Ocular migraine    Palpitations    Past Surgical History:  Procedure Laterality Date   ABDOMINAL HYSTERECTOMY     age was in her 30s   COLONOSCOPY  10/05/2017   Mild sigmoid diverticulosis. Otherwise normal colonoscopy.    cyst removal arm Left 1981   from cat scratch fever   EYE SURGERY     bilateral cataract removal   FOOT ARTHRODESIS Left 11/01/2021   Procedure: LEFT TALONAVICULAR AND SUBTALAR FUSION;  Surgeon: Nadara Mustard, MD;  Location: [facility] OR;  Service: Orthopedics;  Laterality: Left;   Patient Active Problem List   Diagnosis Date Noted   Screening for cardiovascular condition 03/20/2023   Night sweats 03/06/2023   Fibromyalgia 10/28/2022   Osteoarthritis 10/28/2022   Constipation 09/04/2022   Need for vaccination 09/04/2022   Primary osteoarthritis of left shoulder 05/13/2022   Prediabetes 05/01/2022   Murmur 04/17/2022   Posterior tibial tendinitis, left leg    HLD (hyperlipidemia) 08/22/2021   Visual snow syndrome 12/30/2020   Arthropathy of lumbar facet joint 11/12/2020   Chest pain  06/14/2020   Dissociative episodes 06/14/2020   Laceration of right index finger without foreign body without damage to nail 04/03/2020   Multiple drug allergies 04/03/2020   B12 deficiency 03/13/2020   Generalized abdominal pain 03/13/2020   Low serum iron 10/03/2019   Low vitamin D level 10/03/2019   Ocular migraine 10/03/2019   Familial hypercholesterolemia 06/27/2019   Upper GI bleed 06/27/2019   Right foot pain 03/27/2019   Generalized pain 10/08/2018   Hypertension 08/18/2018   Diastolic dysfunction, left ventricle 08/18/2018   Family history of macular degeneration 08/18/2018   GERD (gastroesophageal reflux disease) 08/18/2018   History of cataract surgery 08/18/2018   History of migraine 08/18/2018   Hypertensive cardiomyopathy, without heart failure (HCC) 08/18/2018   Medication refill 08/18/2018   Mixed dyslipidemia 08/18/2018   Obesity, Class II, BMI 35-39.9 08/18/2018   Wellness examination 08/18/2018   Cardiomyopathy (HCC) 02/03/2018   Pain and swelling of ankle, right 01/27/2018   Left-sided low back pain without sciatica 12/04/2017   Fatigue 12/04/2017   Anxiety 11/04/2017   Arthralgia 11/04/2017   History of attempted suicide 11/04/2017   Insomnia 11/04/2017   Moderate episode of recurrent major depressive disorder (HCC) 11/04/2017    PCP: Jiles Prows, NP  REFERRING PROVIDER: Fanny Dance, MD  REFERRING DIAG: Fibromyalgia  THERAPY DIAG:  Pain in left ankle and joints of left foot  Unsteadiness on feet  Other low back pain  Pain of both hip joints  Acute pain of left shoulder  Rationale for Evaluation and Treatment: Rehabilitation  ONSET DATE: 30+ years   SUBJECTIVE:   SUBJECTIVE STATEMENT: "I'm getting discouraged".  Pt reports she did not have increased pain after last session, but has had a hard week with increased pain. She reports increased swelling in Lt ankle today.   PERTINENT HISTORY: Fibromyalgia, Lt foot arthrodesis 2022,  bilateral cataract removal with vision issues anxiety, Lt shoulder pain "several torn tendons" PAIN:  Are you having pain? Yes: NPRS scale: 6-7/10 Pain location: generalized  Pain description: ache Aggravating factors: walking, moving, any activity Relieving factors: nothing  PRECAUTIONS: None  WEIGHT BEARING RESTRICTIONS: No  FALLS:  Has patient fallen in last 6 months? No  LIVING ENVIRONMENT: Lives with: lives with their spouse Lives in: House/apartment Stairs: Yes: External: just a few into the house steps; can reach both Has following equipment at home: Single point cane  OCCUPATION: retired   PLOF: Independent  PATIENT GOALS: be able to walk a mile  NEXT MD VISIT: none noted   OBJECTIVE: (Measures in this section from initial evaluation unless otherwise noted)  DIAGNOSTIC FINDINGS:   PATIENT SURVEYS:    COGNITION: Overall cognitive status: Within functional limits for tasks assessed     SENSATION: Not tested   MUSCLE LENGTH: Hamstrings: Right WNL deg; Left WNL deg Prone Ely's: WNL bilateral  POSTURE: rounded shoulders, forward head, and flexed trunk   PALPATION: Tenderness bilateral glutes   LOWER EXTREMITY ROM:  Passive ROM Right eval Left eval  Hip flexion WNL WNL  Hip extension 0 0  Hip abduction    Hip adduction    Hip internal rotation WNL WNL  Hip external rotation WNL WNL  Knee flexion    Knee extension    Ankle dorsiflexion    Ankle plantarflexion    Ankle inversion    Ankle eversion     (Blank rows = not tested)  LOWER EXTREMITY MMT:  MMT Right eval Left eval  Hip flexion 4 4  Hip extension 3 3  Hip abduction 3 3  Hip adduction    Hip internal rotation    Hip external rotation    Knee flexion    Knee extension 5 5  Ankle dorsiflexion 4 4  Ankle plantarflexion    Ankle inversion    Ankle eversion     (Blank rows = not tested)  LOWER EXTREMITY SPECIAL TESTS:    FUNCTIONAL TESTS:  5 times sit to stand: 9 sec 3  minute walk test: 469ft without AD   GAIT: Distance walked: 3 MWR Assistive device utilized: Single point cane and None Level of assistance: Modified independence Comments: decreased hip extension   TODAY'S TREATMENT: [facility] Adult PT Treatment:                                                DATE: 04/22/23 Pt seen for aquatic therapy today.  Treatment took place in water 3.5-4. ft in depth at the [facility] pool. Temp of water was 91.  Pt entered/exited the pool via stairs with step-to pattern independently with bilat rail.  * unsupported: forward / backward walking -3 laps * unsupported -  side stepping, with added arm addct / abdct 1 lap, then without arm addct x 2  * standard stance with bilat shoulder horiz abdct/add x 10 , tricep press downs with rainbow hand floats x 10  * hip hinge with forward arm reach, UE on rainbow hand floats  x 10   * high knee marching backward /forward with alternating arm row with rainbow hand floats x 2 laps  * holding wall:  3 way straight leg kick 2 x 5 * noodle pull down with TrA set x 10 * straddling yellow noodle - cycling - switched to LEs suspended with UE supported by yellow hand floats  Pt requires the buoyancy and hydrostatic pressure of water for support, and to offload joints by unweighting joint load by at least 50 % in navel deep water and by at least 75-80% in chest to neck deep water.  Viscosity of the water is needed for resistance of strengthening. Water current perturbations provides challenge to standing balance requiring increased core activation.   [facility] Adult PT Treatment:                                                DATE: 04/08/23 Pt seen for aquatic therapy today.  Treatment took place in water 3.5-4. ft in depth at the [facility] pool. Temp of water was 91.  Pt entered/exited the pool via stairs with step-to pattern independently with bilat rail.  * unsupported: forward / backward walking -4 laps * unsupported -  side stepping, then with added arm addct / abdct - 4 laps  * wall push up/off with cues for form x 8 * short yellow noodle pull down with TrA set  * high knee marching backward /forward with alternating arm row with rainbow hand floats x 2 laps  * staggered stance with bilat shoulder horiz abdct/add x 5 each LE forward; tricep push down x 10; shoulder addct/abdct x 10 * return to walking forward/backward with arm swing * staggered stance with kick board row x 10 each * holding yellow hand floats:  tandem gait forward/backward with cues to keep knees relaxed;   3 way straight leg kick 2 x 5 * vertical squats pushing yellow hand float under water x 5 (mindful of ankle position)  Pt requires the buoyancy and hydrostatic pressure of water for support, and to offload joints by unweighting joint load by at least 50 % in navel deep water and by at least 75-80% in chest to neck deep water.  Viscosity of the water is needed for resistance of strengthening. Water current perturbations provides challenge to standing balance requiring increased core activation.  [facility] Adult PT Treatment:                                                DATE: 04/01/23 Pt seen for aquatic therapy today.  Treatment took place in water 3.5-4. ft in depth at the [facility] pool. Temp of water was 91.  Pt entered/exited the pool via stairs with step-to pattern independently with bilat rail.  * unsupported: forward / backward walking -4 laps * unsupported - side stepping, then with added arm addct / abdct - 4 laps  * high knee marching  forward 1 lap, then with reciprocal arm swing, then with alternating arm row with rainbow hand floats x 2 laps  * staggered stance with bilat shoulder horiz abdct/add x 5 each LE forward * standard stance with  shoulder abdct/addct with rainbow hand floats 2 x 5; then 3 side steps with shoulder addct with floats R/L * holding wall:  3 way straight leg kick 2 x 5 * TrA set with short noodle  pull down to thighs x 5 -> hands on kick board x 5 * staggered stance with kick board row x 10 each * trial of lifting kickboard and noodle over head - painful, stopped after 1 rep each * wall push up x 5, trial of push off x 3 painful in Lt shoulder, stopped * return to walking forward with arm swing  Pt requires the buoyancy and hydrostatic pressure of water for support, and to offload joints by unweighting joint load by at least 50 % in navel deep water and by at least 75-80% in chest to neck deep water.  Viscosity of the water is needed for resistance of strengthening. Water current perturbations provides challenge to standing balance requiring increased core activation.   [facility] Adult PT Treatment:                                                DATE: 03/27/23 Therapeutic Exercise: Hamstring sitting stretch x30 sec Supine hip flexor stretch x30 sec Supine diaphragmatic breathing with TSA contraction 10x5 sec Supine bridge 2x10 S/L clamshell 2x10 yellow TB Standing glute set 2x10 Doorway pec stretch at 60 deg 2x30 sec Standing shoulder ext x10 yellow TB  Standing row 2x10 yellow TB Amb x5 min   PATIENT EDUCATION:  Education details: aquatic therapy progressions /modifications Person educated: Patient Education method: Explanation Education comprehension: verbalized understanding and returned demonstration  HOME EXERCISE PROGRAM: Access Code: [email or web address] URL: [email or web address] Date: 03/18/2023 Prepared by: [facility] - Outpatient Rehab - [facility]  Aquatic HEP  Access Code: [phone] URL: [email or web address] Date: 04/01/2023 Prepared by: [facility] - Outpatient Rehab - [address] This aquatic home exercise program from MedBridge utilizes pictures from land based exercises, but has been adapted prior to lamination and issuance.    ASSESSMENT:  CLINICAL IMPRESSION: Pt reported reduction of generalized pain to 6/10 during session.   Pt's greatest barrier is getting to her pool when her pain levels at home are high; discussed strategies.  Plan to progress HEP in final session next week. Last session, pt reported some dizziness with horiz head turns when in tandem stance- PT to assess if continued issue. PT to assess goals on land next visit and update land HEP. Pt making gradual progress towards goals.     OBJECTIVE IMPAIRMENTS: Abnormal gait, decreased activity tolerance, decreased balance, decreased endurance, decreased mobility, difficulty walking, decreased ROM, decreased strength, increased muscle spasms, impaired flexibility, improper body mechanics, and pain.    GOALS: Goals reviewed with patient? Yes  SHORT TERM GOALS: Target date: 03/25/23 Pt will be independent with initial HEP and walking program. Baseline: not able to tolerate land exercise and has not started walking program yet Goal status:ONGOING -04/01/23     LONG TERM GOALS: Target date: 04/29/23  Pt will report atleast 30% improvement in activity participation from the start of PT. Baseline:  Goal status: ONGOING- 04/08/23  2.  Pt will  have increased LE strength to atleast 4/5 MMT to improve her efficiency with ADLs. Baseline:  Goal status: INITIAL  3.  Pt will be able to walk up to 6 minutes during a session without the need for a rest break of significant increase in pain.  Baseline:  Goal status: Partially Met - 5/22 (able to complete in water)  4.  Pt will be independent with her advanced HEP to allow for further improvement in strength and endurance after discharge from PT.  Baseline:  Goal status: INITIAL     PLAN:  PT FREQUENCY: 2x/week  PT DURATION: 6 weeks  PLANNED INTERVENTIONS: Therapeutic exercises, Therapeutic activity, Neuromuscular re-education, Balance training, Gait training, Patient/Family education, Self Care, Joint mobilization, Stair training, Aquatic Therapy, Dry Needling, Electrical stimulation, Cryotherapy, Moist  heat, Taping, Manual therapy, and Re-evaluation  PLAN FOR NEXT SESSION: on land: consider DN for hip/back with TENS if pt agreeable; hip/trunk strengthening and update HEP as able  Mayer Camel, PTA 04/22/23 10:29 AM [facility] [address] [place], [place], [address] Phone: [phone]   Fax:  [phone]

## 2023-04-24 ENCOUNTER — Ambulatory Visit: Payer: Medicare HMO | Attending: Physical Medicine & Rehabilitation | Admitting: Physical Therapy

## 2023-04-24 DIAGNOSIS — M5459 Other low back pain: Secondary | ICD-10-CM | POA: Insufficient documentation

## 2023-04-24 DIAGNOSIS — M25612 Stiffness of left shoulder, not elsewhere classified: Secondary | ICD-10-CM | POA: Diagnosis not present

## 2023-04-24 DIAGNOSIS — M25551 Pain in right hip: Secondary | ICD-10-CM | POA: Insufficient documentation

## 2023-04-24 DIAGNOSIS — M25572 Pain in left ankle and joints of left foot: Secondary | ICD-10-CM | POA: Diagnosis not present

## 2023-04-24 DIAGNOSIS — M25552 Pain in left hip: Secondary | ICD-10-CM | POA: Insufficient documentation

## 2023-04-24 DIAGNOSIS — M25512 Pain in left shoulder: Secondary | ICD-10-CM | POA: Insufficient documentation

## 2023-04-24 DIAGNOSIS — R2681 Unsteadiness on feet: Secondary | ICD-10-CM | POA: Insufficient documentation

## 2023-04-24 NOTE — Therapy (Signed)
OUTPATIENT PHYSICAL THERAPY LOWER EXTREMITY TREATMENT   Patient Name: Leslie Griffin MRN: [id number] DOB:1950/11/27, 72 y.o., female Today's Date: 04/24/2023  END OF SESSION:  PT End of Session - 04/24/23 0932     Visit Number 8    Date for PT Re-Evaluation 06/05/23    Authorization Type [employer]    Authorization Time Period --    Authorization - Visit Number 8    Progress Note Due on Visit 10    PT Start Time 0932    PT Stop Time 1015    PT Time Calculation (min) 43 min    Activity Tolerance Patient tolerated treatment well    Behavior During Therapy WFL for tasks assessed/performed              Past Medical History:  Diagnosis Date   Allergic rhinitis    Anxiety    Arthritis    Family history of colonic polyps    Fibromyalgia    Heart murmur    History of colonic polyps    Hypercholesterolemia    Hypertension    Mitral valve prolapse    Ocular migraine    Palpitations    Past Surgical History:  Procedure Laterality Date   ABDOMINAL HYSTERECTOMY     age was in her 30s   COLONOSCOPY  10/05/2017   Mild sigmoid diverticulosis. Otherwise normal colonoscopy.    cyst removal arm Left 1981   from cat scratch fever   EYE SURGERY     bilateral cataract removal   FOOT ARTHRODESIS Left 11/01/2021   Procedure: LEFT TALONAVICULAR AND SUBTALAR FUSION;  Surgeon: Nadara Mustard, MD;  Location: [facility] OR;  Service: Orthopedics;  Laterality: Left;   Patient Active Problem List   Diagnosis Date Noted   Screening for cardiovascular condition 03/20/2023   Night sweats 03/06/2023   Fibromyalgia 10/28/2022   Osteoarthritis 10/28/2022   Constipation 09/04/2022   Need for vaccination 09/04/2022   Primary osteoarthritis of left shoulder 05/13/2022   Prediabetes 05/01/2022   Murmur 04/17/2022   Posterior tibial tendinitis, left leg    HLD (hyperlipidemia) 08/22/2021   Visual snow syndrome 12/30/2020   Arthropathy of lumbar facet joint 11/12/2020   Chest pain 06/14/2020    Dissociative episodes 06/14/2020   Laceration of right index finger without foreign body without damage to nail 04/03/2020   Multiple drug allergies 04/03/2020   B12 deficiency 03/13/2020   Generalized abdominal pain 03/13/2020   Low serum iron 10/03/2019   Low vitamin D level 10/03/2019   Ocular migraine 10/03/2019   Familial hypercholesterolemia 06/27/2019   Upper GI bleed 06/27/2019   Right foot pain 03/27/2019   Generalized pain 10/08/2018   Hypertension 08/18/2018   Diastolic dysfunction, left ventricle 08/18/2018   Family history of macular degeneration 08/18/2018   GERD (gastroesophageal reflux disease) 08/18/2018   History of cataract surgery 08/18/2018   History of migraine 08/18/2018   Hypertensive cardiomyopathy, without heart failure (HCC) 08/18/2018   Medication refill 08/18/2018   Mixed dyslipidemia 08/18/2018   Obesity, Class II, BMI 35-39.9 08/18/2018   Wellness examination 08/18/2018   Cardiomyopathy (HCC) 02/03/2018   Pain and swelling of ankle, right 01/27/2018   Left-sided low back pain without sciatica 12/04/2017   Fatigue 12/04/2017   Anxiety 11/04/2017   Arthralgia 11/04/2017   History of attempted suicide 11/04/2017   Insomnia 11/04/2017   Moderate episode of recurrent major depressive disorder (HCC) 11/04/2017    PCP: Jiles Prows, NP  REFERRING PROVIDER: Fanny Dance, MD  REFERRING  DIAG: Fibromyalgia  THERAPY DIAG:  Pain in left ankle and joints of left foot  Unsteadiness on feet  Other low back pain  Pain of both hip joints  Rationale for Evaluation and Treatment: Rehabilitation  ONSET DATE: 30+ years   SUBJECTIVE:   SUBJECTIVE STATEMENT: Pt states she's been doing well in the water. Pt reports the weather has been causing increased pain. [facility]   PERTINENT HISTORY: Fibromyalgia, Lt foot arthrodesis 2022, bilateral cataract removal with vision issues anxiety, Lt  shoulder pain "several torn tendons" PAIN:  Are you having pain? Yes: NPRS scale: 6-7/10 Pain location: generalized  Pain description: ache Aggravating factors: walking, moving, any activity Relieving factors: nothing  PRECAUTIONS: None  WEIGHT BEARING RESTRICTIONS: No  FALLS:  Has patient fallen in last 6 months? No  LIVING ENVIRONMENT: Lives with: lives with their spouse Lives in: House/apartment Stairs: Yes: External: just a few into the house steps; can reach both Has following equipment at home: Single point cane  OCCUPATION: retired   PLOF: Independent  PATIENT GOALS: be able to walk a mile  NEXT MD VISIT: none noted   OBJECTIVE: (Measures in this section from initial evaluation unless otherwise noted)  DIAGNOSTIC FINDINGS:   PATIENT SURVEYS:    COGNITION: Overall cognitive status: Within functional limits for tasks assessed     SENSATION: Not tested   MUSCLE LENGTH: Hamstrings: Right WNL deg; Left WNL deg Prone Ely's: WNL bilateral  POSTURE: rounded shoulders, forward head, and flexed trunk   PALPATION: Tenderness bilateral glutes   LOWER EXTREMITY ROM:  Passive ROM Right eval Left eval  Hip flexion WNL WNL  Hip extension 0 0  Hip abduction    Hip adduction    Hip internal rotation WNL WNL  Hip external rotation WNL WNL  Knee flexion    Knee extension    Ankle dorsiflexion    Ankle plantarflexion    Ankle inversion    Ankle eversion     (Blank rows = not tested)  LOWER EXTREMITY MMT:  MMT Right eval Left eval Right 04/24/23 Left 04/24/23  Hip flexion 4 4 5 5   Hip extension 3 3 4 4   Hip abduction 3 3 3+ 3+  Hip adduction      Hip internal rotation      Hip external rotation      Knee flexion      Knee extension 5 5 5 5   Ankle dorsiflexion 4 4 5 5   Ankle plantarflexion      Ankle inversion      Ankle eversion       (Blank rows = not tested)  LOWER EXTREMITY SPECIAL TESTS:    FUNCTIONAL TESTS:  5 times sit to stand: 9  sec 3 minute walk test: 423ft without AD  04/24/23: 3 minute walk test: 584ft without AD Able to amb at most 3 min 49 sec and 648ft   GAIT: Distance walked: 3 MWR Assistive device utilized: Single point cane and None Level of assistance: Modified independence Comments: decreased hip extension   TODAY'S TREATMENT: [facility] Adult PT Treatment:                                                DATE: 04/24/23 Therapeutic Exercise: Supine diaphragmatic breaths with PPT 10x5 sec Supine diaphragmatic  breaths with bent knee fall outs 10x5 sec Supine LTR 10x5 sec Supine heel slides with diaphragmatic breaths x10 R&L Supine glute set 10x5 sec Therapeutic Activity: Rechecking goals    [facility] Adult PT Treatment:                                                DATE: 04/22/23 Pt seen for aquatic therapy today.  Treatment took place in water 3.5-4. ft in depth at the [facility] pool. Temp of water was 91.  Pt entered/exited the pool via stairs with step-to pattern independently with bilat rail.  * unsupported: forward / backward walking -3 laps * unsupported - side stepping, with added arm addct / abdct 1 lap, then without arm addct x 2  * standard stance with bilat shoulder horiz abdct/add x 10 , tricep press downs with rainbow hand floats x 10  * hip hinge with forward arm reach, UE on rainbow hand floats  x 10   * high knee marching backward /forward with alternating arm row with rainbow hand floats x 2 laps  * holding wall:  3 way straight leg kick 2 x 5 * noodle pull down with TrA set x 10 * straddling yellow noodle - cycling - switched to LEs suspended with UE supported by yellow hand floats  Pt requires the buoyancy and hydrostatic pressure of water for support, and to offload joints by unweighting joint load by at least 50 % in navel deep water and by at least 75-80% in chest to neck deep water.  Viscosity of the water is needed for resistance of strengthening. Water current perturbations  provides challenge to standing balance requiring increased core activation.   [facility] Adult PT Treatment:                                                DATE: 04/08/23 Pt seen for aquatic therapy today.  Treatment took place in water 3.5-4. ft in depth at the [facility] pool. Temp of water was 91.  Pt entered/exited the pool via stairs with step-to pattern independently with bilat rail.  * unsupported: forward / backward walking -4 laps * unsupported - side stepping, then with added arm addct / abdct - 4 laps  * wall push up/off with cues for form x 8 * short yellow noodle pull down with TrA set  * high knee marching backward /forward with alternating arm row with rainbow hand floats x 2 laps  * staggered stance with bilat shoulder horiz abdct/add x 5 each LE forward; tricep push down x 10; shoulder addct/abdct x 10 * return to walking forward/backward with arm swing * staggered stance with kick board row x 10 each * holding yellow hand floats:  tandem gait forward/backward with cues to keep knees relaxed;   3 way straight leg kick 2 x 5 * vertical squats pushing yellow hand float under water x 5 (mindful of ankle position)  Pt requires the buoyancy and hydrostatic pressure of water for support, and to offload joints by unweighting joint load by at least 50 % in navel deep water and by at least 75-80% in chest to neck deep water.  Viscosity of the water is needed for resistance  of strengthening. Water current perturbations provides challenge to standing balance requiring increased core activation.  [facility] Adult PT Treatment:                                                DATE: 04/01/23 Pt seen for aquatic therapy today.  Treatment took place in water 3.5-4. ft in depth at the [facility] pool. Temp of water was 91.  Pt entered/exited the pool via stairs with step-to pattern independently with bilat rail.  * unsupported: forward / backward walking -4 laps * unsupported - side  stepping, then with added arm addct / abdct - 4 laps  * high knee marching forward 1 lap, then with reciprocal arm swing, then with alternating arm row with rainbow hand floats x 2 laps  * staggered stance with bilat shoulder horiz abdct/add x 5 each LE forward * standard stance with  shoulder abdct/addct with rainbow hand floats 2 x 5; then 3 side steps with shoulder addct with floats R/L * holding wall:  3 way straight leg kick 2 x 5 * TrA set with short noodle pull down to thighs x 5 -> hands on kick board x 5 * staggered stance with kick board row x 10 each * trial of lifting kickboard and noodle over head - painful, stopped after 1 rep each * wall push up x 5, trial of push off x 3 painful in Lt shoulder, stopped * return to walking forward with arm swing  Pt requires the buoyancy and hydrostatic pressure of water for support, and to offload joints by unweighting joint load by at least 50 % in navel deep water and by at least 75-80% in chest to neck deep water.  Viscosity of the water is needed for resistance of strengthening. Water current perturbations provides challenge to standing balance requiring increased core activation.   [facility] Adult PT Treatment:                                                DATE: 03/27/23 Therapeutic Exercise: Hamstring sitting stretch x30 sec Supine hip flexor stretch x30 sec Supine diaphragmatic breathing with TSA contraction 10x5 sec Supine bridge 2x10 S/L clamshell 2x10 yellow TB Standing glute set 2x10 Doorway pec stretch at 60 deg 2x30 sec Standing shoulder ext x10 yellow TB  Standing row 2x10 yellow TB Amb x5 min   PATIENT EDUCATION:  Education details: aquatic therapy progressions /modifications Person educated: Patient Education method: Explanation Education comprehension: verbalized understanding and returned demonstration  HOME EXERCISE PROGRAM: Access Code: [email or web address] URL: [email or web address] Date: 03/18/2023 Prepared by:  [facility] - Outpatient Rehab - [facility]  Aquatic HEP  Access Code: [phone] URL: [email or web address] Date: 04/01/2023 Prepared by: [facility] - Outpatient Rehab - [address] This aquatic home exercise program from MedBridge utilizes pictures from land based exercises, but has been adapted prior to lamination and issuance.    ASSESSMENT:  CLINICAL IMPRESSION: Treatment session focused on rechecking goals. Pt has demonstrated increase in strength. Still remains the weakest with hip abduction. Improved endurance on 3 min walk test; however, became [facility] when attempting to walk for the full 6 min (had to stop at 3 min 49 sec). Pt has noted increase in overall  function. Has reported some dizziness at end of session while ambulating -- may perform further work up next visit. Rest of session focused on establishing/modifying her current land based exercise as last set appeared to be too intense for pt. Pt will benefit from continued PT to finalize her aquatics HEP and transition her fully to land based exercises to reach her goals.     OBJECTIVE IMPAIRMENTS: Abnormal gait, decreased activity tolerance, decreased balance, decreased endurance, decreased mobility, difficulty walking, decreased ROM, decreased strength, increased muscle spasms, impaired flexibility, improper body mechanics, and pain.    GOALS: Goals reviewed with patient? Yes  SHORT TERM GOALS: Target date: 03/25/23 Pt will be independent with initial HEP and walking program. Baseline: not able to tolerate land exercise and has not started walking program yet Goal status:ONGOING -04/01/23    LONG TERM GOALS: Target date: 06/05/2023  Pt will report atleast 30% improvement in activity participation from the start of PT. Baseline:  04/24/23: 50% improvement in activity participation Goal status: MET  2.  Pt will have increased LE strength to atleast 4/5 MMT to improve her efficiency with ADLs. Baseline:   Goal status: PARTIALLY MET 04/24/23  3.  Pt will be able to walk up to 6 minutes during a session without the need for a rest break of significant increase in pain.  Baseline:  04/24/23: total of 3 min 49 sec on land Goal status: IN PROGRESS  4.  Pt will be independent with her advanced HEP to allow for further improvement in strength and endurance after discharge from PT.  Baseline:  04/24/23: Has been performing aquatics but not consistent with land based exercises Goal status: IN PROGRESS     PLAN:  PT FREQUENCY: 2x/week  PT DURATION: 6 weeks  PLANNED INTERVENTIONS: Therapeutic exercises, Therapeutic activity, Neuromuscular re-education, Balance training, Gait training, Patient/Family education, Self Care, Joint mobilization, Stair training, Aquatic Therapy, Dry Needling, Electrical stimulation, Cryotherapy, Moist heat, Taping, Manual therapy, and Re-evaluation  PLAN FOR NEXT SESSION: on land: consider DN for hip/back with TENS if pt agreeable; hip/trunk strengthening and update HEP as able  [facility] April Ma L Mathilda Maguire, PT 04/24/23 12:15 PM

## 2023-04-27 ENCOUNTER — Encounter: Payer: Self-pay | Admitting: Physical Medicine & Rehabilitation

## 2023-04-27 ENCOUNTER — Encounter: Payer: Medicare HMO | Attending: Physical Medicine & Rehabilitation | Admitting: Physical Medicine & Rehabilitation

## 2023-04-27 VITALS — BP 104/69 | HR 67 | Ht 61.0 in | Wt 192.0 lb

## 2023-04-27 DIAGNOSIS — M542 Cervicalgia: Secondary | ICD-10-CM | POA: Insufficient documentation

## 2023-04-27 DIAGNOSIS — M545 Low back pain, unspecified: Secondary | ICD-10-CM | POA: Diagnosis not present

## 2023-04-27 DIAGNOSIS — M797 Fibromyalgia: Secondary | ICD-10-CM | POA: Insufficient documentation

## 2023-04-27 DIAGNOSIS — G8929 Other chronic pain: Secondary | ICD-10-CM | POA: Diagnosis not present

## 2023-04-27 NOTE — Progress Notes (Signed)
Subjective:    Patient ID: Leslie Griffin, female    DOB: 08/16/1951, 72 y.o.   MRN: [id number]  HPI HPI   Leslie Griffin is a 72 y.o. year old female  who  has a past medical history of Allergic rhinitis, Anxiety, Arthritis, Family history of colonic polyps, Fibromyalgia, Heart murmur, History of colonic polyps, Hypercholesterolemia, Hypertension, Mitral valve prolapse, Ocular migraine, and Palpitations.   They are presenting to PM&R clinic as a new patient for pain management evaluation.  Patient reports she has had severe pain for over 30 years.  She was diagnosed with fibromyalgia many years ago.  Patient reports she was previously on fibromyalgia medication such as gabapentin and Lyrica but both of these quit working.  She was unable to tolerate Cymbalta.  She says her pain has recently worsened due to areas of arthritis that have occurred over the past several years.  She had a left talonavicular and subtalar fusion in December 2022.  she is continue to have pain in her foot since the operation.  Currently her greatest pain is in her lower back, shoulders, right hip, right knee.  Her neck is also painful.  Pain is worsened with activity.  She feels like her mood is decreased due to her pain.  She feels that pain is severely limiting her activities.  She tries to do housework at home but this is very painful.  She has been seen by orthopedics for left rotator cuff tear.     Red flag symptoms: No red flags for back pain endorsed in Hx or ROS     Medications tried: Topical medications- arthritis cream, blu emu didn't help Nsaids doesn't help, ibuprofen Tylenol  doesn't help Opiates  Hydrocodone quit working Oxycodone-mild benefit Tramadol didn't help  Gabapentin - for a few years, was helping at first but stopped  Lyrica -  quit working  TCAs  amitripyline, doesn't recall SNRIs  - Rash with cymbalta and shortness of breath Milnacipran- recalls the name but does not recall trying this CBD  oils didn't help   Other treatments: PT/OT  - Reports completed this recently for her shoulder TENs unit - has not tried  Injections - Reports nerve ablasion of her back  in [place] or [place], C4=5, prior shoulder injections.  She reports minimal benefit with injections Surgery - L ankle surgery    04/27/23 Patient is here for f/u regarding her chronic pain. She reports her pain is improved with aquatherapy. She continues to have increased pain especially when the weather changes.  Pain is worsened by walking increased distances. She is also taking tumaric and this is helping her pain.   Pain Inventory Average Pain 5 Pain Right Now 3 My pain is tingling and aching  In the last 24 hours, has pain interfered with the following? General activity 4 Relation with others 5 Enjoyment of life 5 What TIME of day is your pain at its worst? morning , daytime, evening, night, and varies Sleep (in general) Fair  Pain is worse with: walking, standing, and some activites Pain improves with: therapy/exercise Relief from Meds: 5  Family History  Problem Relation Age of Onset   Coronary artery disease Other    Heart disease Mother    Heart disease Father    Colon polyps Sister    Other Sister        low hemoglobin   Colon cancer Neg Hx    Esophageal cancer Neg Hx    Rectal cancer Neg  Hx    Stomach cancer Neg Hx    Social History   Socioeconomic History   Marital status: Married    Spouse name: Not on file   Number of children: 1   Years of education: Not on file   Highest education level: Associate degree: occupational, Scientist, product/process development, or vocational program  Occupational History   Not on file  Tobacco Use   Smoking status: Never   Smokeless tobacco: Never  Vaping Use   Vaping Use: Never used  Substance and Sexual Activity   Alcohol use: Yes    Comment: ocassionally    Drug use: No   Sexual activity: Not on file  Other Topics Concern   Not on file  Social History  Narrative   Not on file   Social Determinants of Health   Financial Resource Strain: Low Risk  (03/02/2023)   Overall Financial Resource Strain (CARDIA)    Difficulty of Paying Living Expenses: Not hard at all  Food Insecurity: No Food Insecurity (03/02/2023)   Hunger Vital Sign    Worried About Running Out of Food in the Last Year: Never true    Ran Out of Food in the Last Year: Never true  Transportation Needs: No Transportation Needs (03/02/2023)   PRAPARE - Administrator, Civil Service (Medical): No    Lack of Transportation (Non-Medical): No  Physical Activity: Unknown (03/02/2023)   Exercise Vital Sign    Days of Exercise per Week: 0 days    Minutes of Exercise per Session: Not on file  Stress: Stress Concern Present (03/02/2023)   [employer] of Occupational Health - Occupational Stress Questionnaire    Feeling of Stress : Very much  Social Connections: Unknown (03/02/2023)   Social Connection and Isolation Panel [NHANES]    Frequency of Communication with Friends and Family: More than three times a week    Frequency of Social Gatherings with Friends and Family: Patient declined    Attends Religious Services: Never    Database administrator or Organizations: Patient declined    Attends Banker Meetings: Not on file    Marital Status: Married   Past Surgical History:  Procedure Laterality Date   ABDOMINAL HYSTERECTOMY     age was in her 30s   COLONOSCOPY  10/05/2017   Mild sigmoid diverticulosis. Otherwise normal colonoscopy.    cyst removal arm Left 1981   from cat scratch fever   EYE SURGERY     bilateral cataract removal   FOOT ARTHRODESIS Left 11/01/2021   Procedure: LEFT TALONAVICULAR AND SUBTALAR FUSION;  Surgeon: Nadara Mustard, MD;  Location: [facility];  Service: Orthopedics;  Laterality: Left;   Past Surgical History:  Procedure Laterality Date   ABDOMINAL HYSTERECTOMY     age was in her 30s   COLONOSCOPY  10/05/2017   Mild sigmoid  diverticulosis. Otherwise normal colonoscopy.    cyst removal arm Left 1981   from cat scratch fever   EYE SURGERY     bilateral cataract removal   FOOT ARTHRODESIS Left 11/01/2021   Procedure: LEFT TALONAVICULAR AND SUBTALAR FUSION;  Surgeon: Nadara Mustard, MD;  Location: [facility] OR;  Service: Orthopedics;  Laterality: Left;   Past Medical History:  Diagnosis Date   Allergic rhinitis    Anxiety    Arthritis    Family history of colonic polyps    Fibromyalgia    Heart murmur    History of colonic polyps    Hypercholesterolemia  Hypertension    Mitral valve prolapse    Ocular migraine    Palpitations    There were no vitals taken for this visit.  Opioid Risk Score:   Fall Risk Score:  `1  Depression screen [facility] 2/9     03/20/2023    9:17 AM 03/10/2023   10:17 AM 03/06/2023    8:28 AM 12/12/2022   10:23 AM 12/04/2022   11:39 AM 10/28/2022   11:13 AM 09/04/2022    8:47 AM  Depression screen PHQ 2/9  Decreased Interest 0 1 1 0 2 0 0  Down, Depressed, Hopeless 0 1 1 0 2 0 0  PHQ - 2 Score 0 2 2 0 4 0 0  Altered sleeping 0 3 3 0 2  0  Tired, decreased energy 0 3 3 0 3  0  Change in appetite 0 3 2 0 3  0  Feeling bad or failure about yourself  0 1 1 0 0  0  Trouble concentrating 0 1 1 0 0  0  Moving slowly or fidgety/restless 0 0 3 0 0  0  Suicidal thoughts  0 0 0 0  0  PHQ-9 Score 0 13 15 0 12  0  Difficult doing work/chores Not difficult at all Somewhat difficult Somewhat difficult Not difficult at all Extremely dIfficult  Not difficult at all      Review of Systems  Musculoskeletal:  Positive for back pain and neck pain.       LT shoulder foot and abd region/hip pain  All other systems reviewed and are negative.      Objective:   Physical Exam   Gen: no distress, normal appearing HEENT: oral mucosa pink and moist, NCAT Cardio: Reg rate Chest: normal effort, normal rate of breathing Abd: soft, non-distended Ext: no edema Psych: pleasant, normal affect Skin:  intact Neuro: Alert and awake, follows commands, cranial nerves II through XII grossly intact, normal speech and language Sensation intact light touch in all 4 extremities Altered sensation in her left foot however sensation overall intact light touch in all 4 extremities No focal motor deficits Musculoskeletal:  Facet loading negative Slump test caused back pain Spurling's negative Patient is tender throughout arms and legs   C spine Xray 04/29/22 FINDINGS: No acute fracture or subluxation identified. Minimal anterolisthesis of C3 on C4 noted. Mild to moderate intervertebral disc space narrowing from C4 through C7, with small dorsal endplate osteophytes. Evidence of mild facet hypertrophy. No prevertebral soft tissue swelling visualized.   IMPRESSION: Degenerative changes of the cervical spine.   Shoulder xary 04/17/22 IMPRESSION: 1. Mild to moderate glenohumeral and mild acromioclavicular osteoarthritis. 2. Small calcific density overlying the left axillary soft tissues. This is nonspecific. It could represent a partially calcified benign lymph node or sequela of remote soft tissue trauma.      Assessment & Plan:    Polyarthralgia -She has a long history of fibromyalgia, she appears to be tender throughout her entire body.  Suspect fibromyalgia is greatest driving force based hide her chronic pain.  Pain likely does have multifactorial component as she does have history of multiple arthritic issues including left shoulder rotator cuff tear, posterior tibial tendon tendinitis, left talonavicular and subtalar fusion. -TENS unit discussed, Zynax device ordered prior visit -Milnacipran ordered prior visit - she did not try, says she used this in the past without benefit and it was too expensive -Aquatherapy helping- continue -Continue tumaric- this is beneficial  Chronic lower back and neck pain, cervical spondylosis noted on prior x-ray -Asked her to provide records from  MRI L spine and injections  -Treatments as above -She reports prior lumbar injections, sounds like this was facet joint radiofrequency ablation.  She did not find this to be beneficial.  I asked her to to provide records regarding this

## 2023-04-29 ENCOUNTER — Ambulatory Visit (HOSPITAL_BASED_OUTPATIENT_CLINIC_OR_DEPARTMENT_OTHER): Payer: Medicare HMO | Admitting: Physical Therapy

## 2023-05-04 ENCOUNTER — Ambulatory Visit: Payer: Medicare HMO | Admitting: Physical Therapy

## 2023-05-04 DIAGNOSIS — M5459 Other low back pain: Secondary | ICD-10-CM | POA: Diagnosis not present

## 2023-05-04 DIAGNOSIS — M25612 Stiffness of left shoulder, not elsewhere classified: Secondary | ICD-10-CM | POA: Diagnosis not present

## 2023-05-04 DIAGNOSIS — M25572 Pain in left ankle and joints of left foot: Secondary | ICD-10-CM

## 2023-05-04 DIAGNOSIS — R2681 Unsteadiness on feet: Secondary | ICD-10-CM

## 2023-05-04 DIAGNOSIS — M25551 Pain in right hip: Secondary | ICD-10-CM

## 2023-05-04 DIAGNOSIS — M25512 Pain in left shoulder: Secondary | ICD-10-CM | POA: Diagnosis not present

## 2023-05-04 DIAGNOSIS — M25552 Pain in left hip: Secondary | ICD-10-CM | POA: Diagnosis not present

## 2023-05-04 NOTE — Therapy (Signed)
OUTPATIENT PHYSICAL THERAPY LOWER EXTREMITY TREATMENT   Patient Name: Leslie Griffin MRN: [id number] DOB:Sep 02, 1951, 72 y.o., female Today's Date: 05/04/2023  END OF SESSION:  PT End of Session - 05/04/23 1522     Visit Number 9    Date for PT Re-Evaluation 06/05/23    Authorization Type [employer]    Progress Note Due on Visit 10    PT Start Time 1525    PT Stop Time 1610    PT Time Calculation (min) 45 min    Activity Tolerance Patient tolerated treatment well    Behavior During Therapy WFL for tasks assessed/performed               Past Medical History:  Diagnosis Date   Allergic rhinitis    Anxiety    Arthritis    Family history of colonic polyps    Fibromyalgia    Heart murmur    History of colonic polyps    Hypercholesterolemia    Hypertension    Mitral valve prolapse    Ocular migraine    Palpitations    Past Surgical History:  Procedure Laterality Date   ABDOMINAL HYSTERECTOMY     age was in her 30s   COLONOSCOPY  10/05/2017   Mild sigmoid diverticulosis. Otherwise normal colonoscopy.    cyst removal arm Left 1981   from cat scratch fever   EYE SURGERY     bilateral cataract removal   FOOT ARTHRODESIS Left 11/01/2021   Procedure: LEFT TALONAVICULAR AND SUBTALAR FUSION;  Surgeon: Nadara Mustard, MD;  Location: [facility];  Service: Orthopedics;  Laterality: Left;   Patient Active Problem List   Diagnosis Date Noted   Screening for cardiovascular condition 03/20/2023   Night sweats 03/06/2023   Fibromyalgia 10/28/2022   Osteoarthritis 10/28/2022   Constipation 09/04/2022   Need for vaccination 09/04/2022   Primary osteoarthritis of left shoulder 05/13/2022   Prediabetes 05/01/2022   Murmur 04/17/2022   Posterior tibial tendinitis, left leg    HLD (hyperlipidemia) 08/22/2021   Visual snow syndrome 12/30/2020   Arthropathy of lumbar facet joint 11/12/2020   Chest pain 06/14/2020   Dissociative episodes 06/14/2020   Laceration of right index  finger without foreign body without damage to nail 04/03/2020   Multiple drug allergies 04/03/2020   B12 deficiency 03/13/2020   Generalized abdominal pain 03/13/2020   Low serum iron 10/03/2019   Low vitamin D level 10/03/2019   Ocular migraine 10/03/2019   Familial hypercholesterolemia 06/27/2019   Upper GI bleed 06/27/2019   Right foot pain 03/27/2019   Generalized pain 10/08/2018   Hypertension 08/18/2018   Diastolic dysfunction, left ventricle 08/18/2018   Family history of macular degeneration 08/18/2018   GERD (gastroesophageal reflux disease) 08/18/2018   History of cataract surgery 08/18/2018   History of migraine 08/18/2018   Hypertensive cardiomyopathy, without heart failure (HCC) 08/18/2018   Medication refill 08/18/2018   Mixed dyslipidemia 08/18/2018   Obesity, Class II, BMI 35-39.9 08/18/2018   Wellness examination 08/18/2018   Cardiomyopathy (HCC) 02/03/2018   Pain and swelling of ankle, right 01/27/2018   Left-sided low back pain without sciatica 12/04/2017   Fatigue 12/04/2017   Anxiety 11/04/2017   Arthralgia 11/04/2017   History of attempted suicide 11/04/2017   Insomnia 11/04/2017   Moderate episode of recurrent major depressive disorder (HCC) 11/04/2017    PCP: Jiles Prows, NP  REFERRING PROVIDER: Fanny Dance, MD  REFERRING DIAG: Fibromyalgia  THERAPY DIAG:  Pain in left ankle and joints of left  foot  Unsteadiness on feet  Other low back pain  Pain of both hip joints  Acute pain of left shoulder  Stiffness of left shoulder, not elsewhere classified  Rationale for Evaluation and Treatment: Rehabilitation  ONSET DATE: 30+ years   SUBJECTIVE:   SUBJECTIVE STATEMENT: Pt reports she's been doing her exercises but was barely able to do even her breathing exercises on the day after she did not have a good night's sleep. "I got up this morning and I couldn't walk."  PERTINENT HISTORY: Fibromyalgia, Lt foot arthrodesis 2022, bilateral  cataract removal with vision issues anxiety, Lt shoulder pain "several torn tendons" PAIN:  Are you having pain? Yes: NPRS scale: 6-7/10 Pain location: generalized  Pain description: ache Aggravating factors: walking, moving, any activity Relieving factors: nothing  PRECAUTIONS: None  WEIGHT BEARING RESTRICTIONS: No  FALLS:  Has patient fallen in last 6 months? No  LIVING ENVIRONMENT: Lives with: lives with their spouse Lives in: House/apartment Stairs: Yes: External: just a few into the house steps; can reach both Has following equipment at home: Single point cane  OCCUPATION: retired   PLOF: Independent  PATIENT GOALS: be able to walk a mile  NEXT MD VISIT: none noted   OBJECTIVE: (Measures in this section from initial evaluation unless otherwise noted)   MUSCLE LENGTH: Hamstrings: Right WNL deg; Left WNL deg Prone Ely's: WNL bilateral  POSTURE: rounded shoulders, forward head, and flexed trunk   PALPATION: Tenderness bilateral glutes   LOWER EXTREMITY ROM:  Passive ROM Right eval Left eval  Hip flexion WNL WNL  Hip extension 0 0  Hip abduction    Hip adduction    Hip internal rotation WNL WNL  Hip external rotation WNL WNL  Knee flexion    Knee extension    Ankle dorsiflexion    Ankle plantarflexion    Ankle inversion    Ankle eversion     (Blank rows = not tested)  LOWER EXTREMITY MMT:  MMT Right eval Left eval Right 04/24/23 Left 04/24/23  Hip flexion 4 4 5 5   Hip extension 3 3 4 4   Hip abduction 3 3 3+ 3+  Hip adduction      Hip internal rotation      Hip external rotation      Knee flexion      Knee extension 5 5 5 5   Ankle dorsiflexion 4 4 5 5   Ankle plantarflexion      Ankle inversion      Ankle eversion       (Blank rows = not tested)  LOWER EXTREMITY SPECIAL TESTS:    FUNCTIONAL TESTS:  5 times sit to stand: 9 sec 3 minute walk test: 4105ft without AD  04/24/23: 3 minute walk test: 568ft without AD Able to amb at most 3 min  49 sec and 623ft   GAIT: Distance walked: 3 MWR Assistive device utilized: Single point cane and None Level of assistance: Modified independence Comments: decreased hip extension   TODAY'S TREATMENT: [facility] Adult PT Treatment:                                                DATE: 05/04/23 Therapeutic Exercise: Nustep L4 x 10 min for aerobics Supine diaphragmatic breaths with PPT 10x5 sec Supine diaphragmatic breaths with ball squeeze 2x10x5 sec Supine diaphragmatic breaths with clamshell iso 10x5 sec Supine  diaphragmatic breaths with bilat bent knee fall outs 10x5 Supine bent knee glute set 2x10x5 sec Supine bent knee flex/ext on pball x10 Modalities: Moist heat with IFC x 15 min, bilat lumbar paraspinals around L4-L5, intensity to pt tolerance   [facility] Adult PT Treatment:                                                DATE: 04/24/23 Therapeutic Exercise: Supine diaphragmatic breaths with PPT 10x5 sec Supine diaphragmatic breaths with bent knee fall outs 10x5 sec Supine LTR 10x5 sec Supine heel slides with diaphragmatic breaths x10 R&L Supine glute set 10x5 sec Therapeutic Activity: Rechecking goals   [facility] Adult PT Treatment:                                                DATE: 04/22/23 Pt seen for aquatic therapy today.  Treatment took place in water 3.5-4. ft in depth at the [facility] pool. Temp of water was 91.  Pt entered/exited the pool via stairs with step-to pattern independently with bilat rail.  * unsupported: forward / backward walking -3 laps * unsupported - side stepping, with added arm addct / abdct 1 lap, then without arm addct x 2  * standard stance with bilat shoulder horiz abdct/add x 10 , tricep press downs with rainbow hand floats x 10  * hip hinge with forward arm reach, UE on rainbow hand floats  x 10   * high knee marching backward /forward with alternating arm row with rainbow hand floats x 2 laps  * holding wall:  3 way straight leg kick 2 x 5 *  noodle pull down with TrA set x 10 * straddling yellow noodle - cycling - switched to LEs suspended with UE supported by yellow hand floats  Pt requires the buoyancy and hydrostatic pressure of water for support, and to offload joints by unweighting joint load by at least 50 % in navel deep water and by at least 75-80% in chest to neck deep water.  Viscosity of the water is needed for resistance of strengthening. Water current perturbations provides challenge to standing balance requiring increased core activation.   [facility] Adult PT Treatment:                                                DATE: 04/08/23 Pt seen for aquatic therapy today.  Treatment took place in water 3.5-4. ft in depth at the [facility] pool. Temp of water was 91.  Pt entered/exited the pool via stairs with step-to pattern independently with bilat rail.  * unsupported: forward / backward walking -4 laps * unsupported - side stepping, then with added arm addct / abdct - 4 laps  * wall push up/off with cues for form x 8 * short yellow noodle pull down with TrA set  * high knee marching backward /forward with alternating arm row with rainbow hand floats x 2 laps  * staggered stance with bilat shoulder horiz abdct/add x 5 each LE forward; tricep push down x 10; shoulder addct/abdct x 10 * return  to walking forward/backward with arm swing * staggered stance with kick board row x 10 each * holding yellow hand floats:  tandem gait forward/backward with cues to keep knees relaxed;   3 way straight leg kick 2 x 5 * vertical squats pushing yellow hand float under water x 5 (mindful of ankle position)  Pt requires the buoyancy and hydrostatic pressure of water for support, and to offload joints by unweighting joint load by at least 50 % in navel deep water and by at least 75-80% in chest to neck deep water.  Viscosity of the water is needed for resistance of strengthening. Water current perturbations provides challenge to standing  balance requiring increased core activation.  [facility] Adult PT Treatment:                                                DATE: 04/01/23 Pt seen for aquatic therapy today.  Treatment took place in water 3.5-4. ft in depth at the [facility] pool. Temp of water was 91.  Pt entered/exited the pool via stairs with step-to pattern independently with bilat rail.  * unsupported: forward / backward walking -4 laps * unsupported - side stepping, then with added arm addct / abdct - 4 laps  * high knee marching forward 1 lap, then with reciprocal arm swing, then with alternating arm row with rainbow hand floats x 2 laps  * staggered stance with bilat shoulder horiz abdct/add x 5 each LE forward * standard stance with  shoulder abdct/addct with rainbow hand floats 2 x 5; then 3 side steps with shoulder addct with floats R/L * holding wall:  3 way straight leg kick 2 x 5 * TrA set with short noodle pull down to thighs x 5 -> hands on kick board x 5 * staggered stance with kick board row x 10 each * trial of lifting kickboard and noodle over head - painful, stopped after 1 rep each * wall push up x 5, trial of push off x 3 painful in Lt shoulder, stopped * return to walking forward with arm swing  Pt requires the buoyancy and hydrostatic pressure of water for support, and to offload joints by unweighting joint load by at least 50 % in navel deep water and by at least 75-80% in chest to neck deep water.  Viscosity of the water is needed for resistance of strengthening. Water current perturbations provides challenge to standing balance requiring increased core activation.   [facility] Adult PT Treatment:                                                DATE: 03/27/23 Therapeutic Exercise: Hamstring sitting stretch x30 sec Supine hip flexor stretch x30 sec Supine diaphragmatic breathing with TSA contraction 10x5 sec Supine bridge 2x10 S/L clamshell 2x10 yellow TB Standing glute set 2x10 Doorway pec stretch  at 60 deg 2x30 sec Standing shoulder ext x10 yellow TB  Standing row 2x10 yellow TB Amb x5 min   PATIENT EDUCATION:  Education details: aquatic therapy progressions /modifications Person educated: Patient Education method: Explanation Education comprehension: verbalized understanding and returned demonstration  HOME EXERCISE PROGRAM: Access Code: [email or web address] URL: [email or web address] Date: 03/18/2023 Prepared by: [facility] - Outpatient Rehab -  [facility]  Aquatic HEP  Access Code: [phone] URL: [email or web address] Date: 04/01/2023 Prepared by: [facility] - Outpatient Rehab - [address] This aquatic home exercise program from MedBridge utilizes pictures from land based exercises, but has been adapted prior to lamination and issuance.    ASSESSMENT:  CLINICAL IMPRESSION: Pt reports decreased energy today. Continued core work with diaphragmatic breaths and gentle hip strengthening. Performed trial of electrotherapy this session - Dr. Benjie Karvonen had suggested TENS but pt was not sure about whether the cost was worth it.     OBJECTIVE IMPAIRMENTS: Abnormal gait, decreased activity tolerance, decreased balance, decreased endurance, decreased mobility, difficulty walking, decreased ROM, decreased strength, increased muscle spasms, impaired flexibility, improper body mechanics, and pain.    GOALS: Goals reviewed with patient? Yes  SHORT TERM GOALS: Target date: 03/25/23 Pt will be independent with initial HEP and walking program. Baseline: not able to tolerate land exercise and has not started walking program yet Goal status:ONGOING -04/01/23    LONG TERM GOALS: Target date: 06/05/2023  Pt will report atleast 30% improvement in activity participation from the start of PT. Baseline:  04/24/23: 50% improvement in activity participation Goal status: MET  2.  Pt will have increased LE strength to atleast 4/5 MMT to improve her efficiency with  ADLs. Baseline:  Goal status: PARTIALLY MET 04/24/23  3.  Pt will be able to walk up to 6 minutes during a session without the need for a rest break of significant increase in pain.  Baseline:  04/24/23: total of 3 min 49 sec on land Goal status: IN PROGRESS  4.  Pt will be independent with her advanced HEP to allow for further improvement in strength and endurance after discharge from PT.  Baseline:  04/24/23: Has been performing aquatics but not consistent with land based exercises Goal status: IN PROGRESS     PLAN:  PT FREQUENCY: 2x/week  PT DURATION: 6 weeks  PLANNED INTERVENTIONS: Therapeutic exercises, Therapeutic activity, Neuromuscular re-education, Balance training, Gait training, Patient/Family education, Self Care, Joint mobilization, Stair training, Aquatic Therapy, Dry Needling, Electrical stimulation, Cryotherapy, Moist heat, Taping, Manual therapy, and Re-evaluation  PLAN FOR NEXT SESSION: on land: consider DN for hip/back with TENS if pt agreeable; hip/trunk strengthening and update HEP as able  [facility] April Ma L Marlis Oldaker, PT 05/04/23 3:23 PM

## 2023-05-12 ENCOUNTER — Ambulatory Visit: Payer: Medicare HMO | Admitting: Physical Therapy

## 2023-05-18 ENCOUNTER — Encounter: Payer: Medicare HMO | Admitting: Physical Therapy

## 2023-05-22 ENCOUNTER — Encounter: Payer: Self-pay | Admitting: Nurse Practitioner

## 2023-05-25 ENCOUNTER — Encounter: Payer: Medicare HMO | Admitting: Physical Therapy

## 2023-06-01 ENCOUNTER — Encounter: Payer: Self-pay | Admitting: Cardiology

## 2023-06-01 ENCOUNTER — Encounter: Payer: Medicare HMO | Admitting: Physical Therapy

## 2023-06-03 NOTE — Telephone Encounter (Signed)
Suspect this is likely due to amlodipine use.  Would recommend decreasing amlodipine to 5 mg daily.  Would increase carvedilol to 12.5 mg twice daily.  Recommend checking BP/heart rate daily for next 2 weeks and let us know results as well as if her swelling improves with decreasing amlodipine

## 2023-06-05 ENCOUNTER — Telehealth (INDEPENDENT_AMBULATORY_CARE_PROVIDER_SITE_OTHER): Payer: Medicare HMO | Admitting: Nurse Practitioner

## 2023-06-05 VITALS — BP 106/72 | HR 68

## 2023-06-05 DIAGNOSIS — G4700 Insomnia, unspecified: Secondary | ICD-10-CM | POA: Diagnosis not present

## 2023-06-05 DIAGNOSIS — I43 Cardiomyopathy in diseases classified elsewhere: Secondary | ICD-10-CM

## 2023-06-05 DIAGNOSIS — R61 Generalized hyperhidrosis: Secondary | ICD-10-CM | POA: Diagnosis not present

## 2023-06-05 DIAGNOSIS — I119 Hypertensive heart disease without heart failure: Secondary | ICD-10-CM

## 2023-06-05 MED ORDER — AMLODIPINE BESYLATE 5 MG PO TABS
5.0000 mg | ORAL_TABLET | Freq: Every day | ORAL | Status: DC
Start: 2023-06-05 — End: 2023-11-02

## 2023-06-05 MED ORDER — CARVEDILOL 12.5 MG PO TABS
12.5000 mg | ORAL_TABLET | Freq: Two times a day (BID) | ORAL | Status: DC
Start: 2023-06-05 — End: 2023-06-30

## 2023-06-05 MED ORDER — TRAZODONE HCL 50 MG PO TABS
75.0000 mg | ORAL_TABLET | Freq: Every day | ORAL | 2 refills | Status: DC
Start: 2023-06-05 — End: 2023-07-06

## 2023-06-05 NOTE — Assessment & Plan Note (Signed)
Chronic Per cardiology recently changed medication dosage of amlodipine to 5mg /day and coreg to 12.5mg  BID. Chart updated

## 2023-06-05 NOTE — Progress Notes (Signed)
Established Patient Office Visit  An audio/visual tele-health visit was completed today for this patient. I connected with  Vella Kohler on 06/05/23 utilizing audio/visual technology and verified that I am speaking with the correct person using two identifiers. The patient was located at their home, and I was located at the office of [facility] at [facility] during the encounter. I discussed the limitations of evaluation and management by telemedicine. The patient expressed understanding and agreed to proceed.    Subjective   Patient ID: Leslie Griffin, female    DOB: 19-Jun-1951  Age: 72 y.o. MRN: [id number]  No chief complaint on file.  PMHx of anxiety, arthritis, hypertrophic cardiomyopathy, HLD, HTN, fibromyalgia, ocular migraines.   Insomnia: intermittent, takes trazodone 50mg  at night. Has night sweats that started years ago but  intensified and started occurring more frequently around least 3 months ago. Work-up thus far has indicated neg quantiferron gold, normal thyroid function, no HIV, CRP normal, CMP normal, urinalysis normal. Now, the night sweats are happening more frequently and is also experiencing chills in the afternoons, no fever, no weight loss. Has cancer in her family history but not sure of what types. Has tried and failed gabapentin, melatonin, herbal supplements, benadryl. Intolerant to duloxetine. Had hysterectomy ~40 years ago.     ROS: see HPI    Objective:     BP 106/72   Pulse 68  BP Readings from Last 3 Encounters:  06/05/23 106/72  04/27/23 104/69  03/20/23 108/64   Wt Readings from Last 3 Encounters:  04/27/23 192 lb (87.1 kg)  03/20/23 190 lb (86.2 kg)  03/10/23 191 lb 6.4 oz (86.8 kg)      Physical Exam Comprehensive physical exam not completed today as office visit was conducted remotely.  Patient appears well on video, no evidence of acute distress noted.  Patient was alert and oriented, and appeared to have appropriate  judgment.   No results found for any visits on 06/05/23.    The 10-year ASCVD risk score (Arnett DK, et al., 2019) is: 8.9%    Assessment & Plan:   Problem List Items Addressed This Visit       Cardiovascular and Mediastinum   Hypertensive cardiomyopathy, without heart failure (HCC)    Chronic Per cardiology recently changed medication dosage of amlodipine to 5mg /day and coreg to 12.5mg  BID. Chart updated      Relevant Medications   amLODipine (NORVASC) 5 MG tablet   carvedilol (COREG) 12.5 MG tablet     Musculoskeletal and Integument   Hyperhidrosis - Primary    Subacute, etiology unclear, getting progressively worse.  Patient either unable to tolerate or has failed gabapentin and duloxetine. I feel this is less likely to be menopausal at this time.  I am concerned about possibly oncologic cause so will refer to hematology/oncology for further workup. If workup negative may consider referral to dermatology.       Relevant Orders   Ambulatory referral to Hematology / Oncology     Other   Insomnia    Per shared decision making will trial higher dose of trazodone at 75mg  at bedtime as needed for insomnia. Review of EKG on 03/2023 identifies Qtc of 443. Considered prazosin due to her having nightmares, but due to her hypertrophic cardiomyopathy have decided against this for now.       Relevant Medications   traZODone (DESYREL) 50 MG tablet    Return for As scheduled or sooner as needed.    Min Tunnell E  Wallace Cullens, NP

## 2023-06-05 NOTE — Assessment & Plan Note (Addendum)
Per shared decision making will trial higher dose of trazodone at 75mg  at bedtime as needed for insomnia. Review of EKG on 03/2023 identifies Qtc of 443. Considered prazosin due to her having nightmares, but due to her hypertrophic cardiomyopathy have decided against this for now.

## 2023-06-05 NOTE — Assessment & Plan Note (Signed)
Subacute, etiology unclear, getting progressively worse.  Patient either unable to tolerate or has failed gabapentin and duloxetine. I feel this is less likely to be menopausal at this time.  I am concerned about possibly oncologic cause so will refer to hematology/oncology for further workup. If workup negative may consider referral to dermatology.

## 2023-06-09 NOTE — Progress Notes (Signed)
[facility] Telephone:([phone]) [phone]   [phone]  INITIAL CONSULTATION:  Patient Care Team: Elenore Paddy, NP as PCP - General (Nurse Practitioner) Little Ishikawa, MD as PCP - Cardiology (Cardiology)  CHIEF COMPLAINTS/PURPOSE OF CONSULTATION:  "Hyperhidrosis "  HISTORY OF PRESENTING ILLNESS:  Leslie Griffin 72 y.o. female with medical history significant for anxiety, arthritis, hypertrophic cardiomyopathy, HLD, HTN, fibromyalgia, ocular migraines.  On review of the previous records patient is being referred by primary care provider for subacute hyperhidrosis with unclear etiology x 1 year that have become more frequent in the last 3 months. Outpatient workup includes negative quantiferron gold, normal thyroid function, negative HIV, CRP normal, CMP normal, urinalysis normal.  On exam today patient reports that she has been experiencing night sweats x 10 years.  They have worsened in the last 2 years.  She feels as if her night sweats have leveled off in the last 3 months.  Patient describes them as waking up during the night with a sweat dripping down her back.  She will uncover and experience chills within 5 to 10 minutes.  She denies having to change her pajamas or bed sheets.  Patient also has had pain under her right underarm x approximately 6 months without any visible swelling.  Patient denies any recent illness.  She does report history of having left axilla lymph nodes removed in 1983 after developing cat scratch fever.  Patient denies any fevers or weight loss.  Her amlodipine dose was recently decreased from 10 to 5 mg because of lower extremity edema, otherwise she denies any new medications or dose adjustments.   Further discussion reveals patient is a never smoker.  Her last colonoscopy in 2020 was normal.  She believes her last mammogram was in 2019 and reports that it was normal.  She had complete hysterectomy in her 30s,  denies ever taking estrogen replacement.  MEDICAL HISTORY:  Past Medical History:  Diagnosis Date   Allergic rhinitis    Anxiety    Arthritis    Family history of colonic polyps    Fibromyalgia    Heart murmur    History of colonic polyps    Hypercholesterolemia    Hypertension    Mitral valve prolapse    Ocular migraine    Palpitations     SURGICAL HISTORY: Past Surgical History:  Procedure Laterality Date   ABDOMINAL HYSTERECTOMY     age was in her 30s   COLONOSCOPY  10/05/2017   Mild sigmoid diverticulosis. Otherwise normal colonoscopy.    cyst removal arm Left 1981   from cat scratch fever   EYE SURGERY     bilateral cataract removal   FOOT ARTHRODESIS Left 11/01/2021   Procedure: LEFT TALONAVICULAR AND SUBTALAR FUSION;  Surgeon: Nadara Mustard, MD;  Location: [facility] OR;  Service: Orthopedics;  Laterality: Left;    SOCIAL HISTORY: Social History   Socioeconomic History   Marital status: Married    Spouse name: Not on file   Number of children: 1   Years of education: Not on file   Highest education level: Associate degree: occupational, Scientist, product/process development, or vocational program  Occupational History   Not on file  Tobacco Use   Smoking status: Never   Smokeless tobacco: Never  Vaping Use   Vaping status: Never Used  Substance and Sexual Activity   Alcohol use: Yes    Comment: ocassionally    Drug use: No   Sexual activity: Not on file  Other Topics Concern   Not on file  Social History Narrative   Not on file   Social Determinants of Health   Financial Resource Strain: Low Risk  (03/02/2023)   Overall Financial Resource Strain (CARDIA)    Difficulty of Paying Living Expenses: Not hard at all  Food Insecurity: No Food Insecurity (03/02/2023)   Hunger Vital Sign    Worried About Running Out of Food in the Last Year: Never true    Ran Out of Food in the Last Year: Never true  Transportation Needs: No Transportation Needs (03/02/2023)   PRAPARE - Therapist, art (Medical): No    Lack of Transportation (Non-Medical): No  Physical Activity: Unknown (03/02/2023)   Exercise Vital Sign    Days of Exercise per Week: 0 days    Minutes of Exercise per Session: Not on file  Stress: Stress Concern Present (03/02/2023)   [employer] of Occupational Health - Occupational Stress Questionnaire    Feeling of Stress : Very much  Social Connections: Unknown (03/02/2023)   Social Connection and Isolation Panel [NHANES]    Frequency of Communication with Friends and Family: More than three times a week    Frequency of Social Gatherings with Friends and Family: Patient declined    Attends Religious Services: Never    Database administrator or Organizations: Patient declined    Attends Banker Meetings: Not on file    Marital Status: Married  Intimate Partner Violence: Not At Risk (12/26/2020)   Received from [facility] visits prior to 01/17/2023.   Humiliation, Afraid, Rape, and Kick questionnaire    Fear of Current or Ex-Partner: No    Emotionally Abused: No    Physically Abused: No    Sexually Abused: No    FAMILY HISTORY: Family History  Problem Relation Age of Onset   Coronary artery disease Other    Heart disease Mother    Heart disease Father    Colon polyps Sister    Other Sister        low hemoglobin   Colon cancer Neg Hx    Esophageal cancer Neg Hx    Rectal cancer Neg Hx    Stomach cancer Neg Hx     ALLERGIES:  is allergic to benadryl [diphenhydramine hcl], cephalexin, duloxetine, penicillins, topiramate, and sulfa antibiotics.  MEDICATIONS:  Current Outpatient Medications  Medication Sig Dispense Refill   amLODipine (NORVASC) 5 MG tablet Take 1 tablet (5 mg total) by mouth daily.     carvedilol (COREG) 12.5 MG tablet Take 1 tablet (12.5 mg total) by mouth 2 (two) times daily.     cholecalciferol (VITAMIN D) 25 MCG (1000 UNIT) tablet Take 1,000 Units by mouth daily.      cyanocobalamin 1000 MCG tablet Take 1 tablet by mouth daily.     omeprazole (PRILOSEC) 20 MG capsule Take 1 capsule (20 mg total) by mouth daily. Please schedule appointment for additional refills. 90 capsule 1   rosuvastatin (CRESTOR) 10 MG tablet Take 1 tablet (10 mg total) by mouth daily. 90 tablet 1   traZODone (DESYREL) 50 MG tablet Take 1.5 tablets (75 mg total) by mouth at bedtime. 45 tablet 2   TURMERIC PO Take by mouth.     No current facility-administered medications for this visit.    REVIEW OF SYSTEMS:   All other systems are reviewed and are negative for acute change except as noted in the HPI.  PHYSICAL EXAMINATION:  ECOG PERFORMANCE STATUS: 1 - Symptomatic but completely ambulatory  Vitals:   06/10/23 1021  BP: 111/73  Pulse: 62  Resp: 16  Temp: 98 F (36.7 C)  SpO2: 95%   There were no vitals filed for this visit.  Physical Exam Vitals reviewed.  Constitutional:      Appearance: She is not ill-appearing or toxic-appearing.  HENT:     Head: Normocephalic.     Right Ear: External ear normal.     Left Ear: External ear normal.     Nose: Nose normal.  Eyes:     Conjunctiva/sclera: Conjunctivae normal.  Cardiovascular:     Rate and Rhythm: Normal rate and regular rhythm.     Pulses: Normal pulses.     Heart sounds: Normal heart sounds.  Pulmonary:     Effort: Pulmonary effort is normal.     Breath sounds: Normal breath sounds.  Abdominal:     General: There is no distension.  Musculoskeletal:        General: Normal range of motion.     Cervical back: Normal range of motion.  Lymphadenopathy:     Head:     Right side of head: No submental, submandibular or occipital adenopathy.     Left side of head: No submental, submandibular or occipital adenopathy.     Cervical: No cervical adenopathy.     Upper Body:     Right upper body: Axillary adenopathy present. No supraclavicular, pectoral or epitrochlear adenopathy.     Left upper body: No supraclavicular,  axillary, pectoral or epitrochlear adenopathy.     Lower Body: No right inguinal adenopathy. No left inguinal adenopathy.  Skin:    General: Skin is warm and dry.  Neurological:     General: No focal deficit present.     Mental Status: She is alert.       LABORATORY DATA:  I have reviewed the data as listed    Latest Ref Rng & Units 06/10/2023   11:25 AM 03/06/2023    8:41 AM 12/12/2022   11:31 AM  CBC  WBC 4.0 - 10.5 K/uL 3.9  5.2  6.1   Hemoglobin 12.0 - 15.0 g/dL 40.9  81.1  91.4   Hematocrit 36.0 - 46.0 % 38.6  40.6  38.7   Platelets 150 - 400 K/uL 210  252.0  237.0        Latest Ref Rng & Units 03/06/2023    8:41 AM 01/27/2023    8:40 AM 12/12/2022   11:31 AM  CMP  Glucose 70 - 99 mg/dL 89  94  91   BUN 6 - 23 mg/dL 13  19  23    Creatinine 0.40 - 1.20 mg/dL 7.82  9.56  2.13   Sodium 135 - 145 mEq/L 139  139  141   Potassium 3.5 - 5.1 mEq/L 3.6  4.4  3.8   Chloride 96 - 112 mEq/L 102  101  103   CO2 19 - 32 mEq/L 30  25  28    Calcium 8.4 - 10.5 mg/dL 9.4  9.8  9.5   Total Protein 6.0 - 8.3 g/dL 7.7  7.0    Total Bilirubin 0.2 - 1.2 mg/dL 0.4  0.3    Alkaline Phos 39 - 117 U/L 89  129    AST 0 - 37 U/L 20  24    ALT 0 - 35 U/L 21  42       RADIOGRAPHIC STUDIES: I have personally reviewed the radiological  images as listed and agreed with the findings in the report. No results found.  ASSESSMENT & PLAN Leslie Griffin is a 72 y.o. female presenting to the [facility] for consultation regarding hyperhidrosis. We have reviewed etiologies including infectious process, inflammatory process, lymphoproliferative disorder. Patient will proceed with laboratory workup today   #Hyperhidrosis  -Labs collected today include CBC, CMP, ESR, CRP, LDH. - CT CAP ordered to evaluation for lymphadenopathy. Will proceed with lymph node biopsy pending CT results.  - If work up is unremarkable will encourage patient to follow up with pcp. -Patient will RTC when work up is  complete.  Patient expressed understanding of the recommended workup and is agreeable to move forward.    Orders Placed This Encounter  Procedures   CT CHEST ABDOMEN PELVIS W CONTRAST    Standing Status:   Future    Standing Expiration Date:   06/09/2024    Order Specific Question:   If indicated for the ordered procedure, I authorize the administration of contrast media per Radiology protocol    Answer:   Yes    Order Specific Question:   Does the patient have a contrast media/X-ray dye allergy?    Answer:   No    Order Specific Question:   Preferred imaging location?    Answer:   [facility]    Order Specific Question:   If indicated for the ordered procedure, I authorize the administration of oral contrast media per Radiology protocol    Answer:   Yes   CBC with Differential (Cancer Center Only)    Standing Status:   Future    Number of Occurrences:   1    Standing Expiration Date:   06/09/2024   CMP (Cancer Center only)    Standing Status:   Future    Number of Occurrences:   1    Standing Expiration Date:   06/09/2024   Sedimentation rate    Standing Status:   Future    Number of Occurrences:   1    Standing Expiration Date:   06/09/2024   C-reactive protein    Standing Status:   Future    Number of Occurrences:   1    Standing Expiration Date:   06/09/2024   Lactate dehydrogenase (LDH)    Standing Status:   Future    Number of Occurrences:   1    Standing Expiration Date:   06/09/2024    All questions were answered. The patient knows to call the clinic with any problems, questions or concerns.  Shared visit with Dr. Leonides Schanz.  I have spent a total of 60 minutes minutes of face-to-face and non-face-to-face time, preparing to see the patient, obtaining and/or reviewing separately obtained history, performing a medically appropriate examination, counseling and educating the patient, ordering medications/tests/procedures, referring and communicating with other health  care professionals, documenting clinical information in the electronic health record, independently interpreting results and communicating results to the patient, and care coordination.   Namon Cirri PA-C Department of Hematology/Oncology [facility] at [facility] Phone: [phone]  I have read the above note and personally examined the patient. I agree with the assessment and plan as noted above.  Briefly Leslie Griffin is a 72 year old female who presents with a history of night sweats.  She reports that she has been having night sweats for years but there has been worsening in the last 2 years.  Today we will order workup to include LDH,  ESR, CRP, and CBC and CMP.  We will also order CT chest abdomen pelvis in order to rule out underlying lymphoproliferative disorder.  At this time very low suspicion that her chronic night sweats are secondary to an underlying hematological disorder.  In the event her workup is normal would recommend the patient continue to follow-up with her PCP.  She is not having any symptoms concerning for infection and is tested negative for TB.   Ulysees Barns, MD Department of Hematology/Oncology [facility] at [facility] Phone: [phone] Pager: [phone] Email: [email or web address]

## 2023-06-10 ENCOUNTER — Inpatient Hospital Stay: Payer: Medicare HMO | Attending: Physician Assistant | Admitting: Physician Assistant

## 2023-06-10 ENCOUNTER — Inpatient Hospital Stay: Payer: Medicare HMO

## 2023-06-10 ENCOUNTER — Telehealth: Payer: Self-pay

## 2023-06-10 ENCOUNTER — Other Ambulatory Visit: Payer: Self-pay

## 2023-06-10 VITALS — BP 111/73 | HR 62 | Temp 98.0°F | Resp 16

## 2023-06-10 DIAGNOSIS — R61 Generalized hyperhidrosis: Secondary | ICD-10-CM

## 2023-06-10 LAB — CMP (CANCER CENTER ONLY)
ALT: 14 U/L (ref 0–44)
AST: 16 U/L (ref 15–41)
Albumin: 4.4 g/dL (ref 3.5–5.0)
Alkaline Phosphatase: 77 U/L (ref 38–126)
Anion gap: 7 (ref 5–15)
BUN: 10 mg/dL (ref 8–23)
CO2: 29 mmol/L (ref 22–32)
Calcium: 9.6 mg/dL (ref 8.9–10.3)
Chloride: 103 mmol/L (ref 98–111)
Creatinine: 0.58 mg/dL (ref 0.44–1.00)
GFR, Estimated: >60 mL/min (ref >60)
Glucose, Bld: 87 mg/dL (ref 70–99)
Potassium: 4 mmol/L (ref 3.5–5.1)
Sodium: 139 mmol/L (ref 135–145)
Total Bilirubin: 0.5 mg/dL (ref 0.3–1.2)
Total Protein: 7.2 g/dL (ref 6.5–8.1)

## 2023-06-10 LAB — CBC WITH DIFFERENTIAL (CANCER CENTER ONLY)
Abs Immature Granulocytes: 0 10*3/uL (ref 0.00–0.07)
Basophils Absolute: 0 10*3/uL (ref 0.0–0.1)
Basophils Relative: 1 %
Eosinophils Absolute: 0.1 10*3/uL (ref 0.0–0.5)
Eosinophils Relative: 3 %
HCT: 38.6 % (ref 36.0–46.0)
Hemoglobin: 12.6 g/dL (ref 12.0–15.0)
Immature Granulocytes: 0 %
Lymphocytes Relative: 35 %
Lymphs Abs: 1.4 10*3/uL (ref 0.7–4.0)
MCH: 28.8 pg (ref 26.0–34.0)
MCHC: 32.6 g/dL (ref 30.0–36.0)
MCV: 88.1 fL (ref 80.0–100.0)
Monocytes Absolute: 0.4 10*3/uL (ref 0.1–1.0)
Monocytes Relative: 10 %
Neutro Abs: 2.1 10*3/uL (ref 1.7–7.7)
Neutrophils Relative %: 51 %
Platelet Count: 210 10*3/uL (ref 150–400)
RBC: 4.38 MIL/uL (ref 3.87–5.11)
RDW: 13.1 % (ref 11.5–15.5)
WBC Count: 3.9 10*3/uL — ABNORMAL LOW (ref 4.0–10.5)
nRBC: 0 % (ref 0.0–0.2)

## 2023-06-10 LAB — SEDIMENTATION RATE: Sed Rate: 11 mm/hr (ref 0–22)

## 2023-06-10 LAB — LACTATE DEHYDROGENASE: LDH: 165 U/L (ref 98–192)

## 2023-06-10 LAB — C-REACTIVE PROTEIN: CRP: 0.6 mg/dL (ref <1.0)

## 2023-06-10 NOTE — Patient Instructions (Signed)
Diagnostic Clinic Office Visit Discharge Information and Instructions  Thank you for choosing [facility] for your healthcare needs.  Below is a summary of today's discussion, along with our contact information and an outline of what to expect next.  Reason for Visit:  night sweats  Proposed Diagnostic Care Plan: Basic lab work and inflammatory markers collected today CT scan of your chest/abdomen/pelvis has been ordered. Once we have your insurance approval we will schedule it for the first available opening. If you need to change the day of the scan [phone]. We will call you once the CT scan results to discuss next steps.   What to Expect: - Generally, when lab tests are ordered the results can take up to 1 week for results to be available.  At that point, we will contact you to discuss your results with you.  Unless there is a critical result, we will typically wait for all of your lab results to be available before contacting you. - If a biopsy is part of your Care Plan, those results can take on average 7-10 days to result.  Once results are available, we will contact you to discuss your pathology results and any next steps. - If you have additional imaging ordered, such as a CT Scan, MRI, Ultrasound, Bone Scan, or PET scan, your imaging will need to be authorized then scheduled with the earliest available appointment.  You may be asked to travel to another hospital within [facility] who has a sooner availability, please consider doing so if asked. - If you use MyChart, your results will be available to you in the MyChart portal.  Your provider will be in touch with you as soon as all of your results are available to be discussed.  Your Diagnostic Clinic Provider:  Namon Cirri PA-C and Dr. Leonides Schanz  If you or your caregiver have number blocking on your cell phones, please ensure the cancer center's numbers are not blocked.  If you are not a registered MyChart user, please  consider enrolling in MyChart to receive your test results and visit notes.  You can also access your discharge instructions electronically.  MyChart also gives you an electronic means to communicate with your Care Team instead of needing to call in to the cancer center.  We appreciate you trusting [place] with your healthcare and look forward to partnering with you as we work to uncover what your potential diagnosis may be.  Please do not hesitate to reach out at any point with questions or concerns.

## 2023-06-10 NOTE — Telephone Encounter (Signed)
RN called patient regarding upcoming CT scan, no answer and voicemail left. Advised patient to call back if the date and time of scan do not work for her.

## 2023-06-12 ENCOUNTER — Encounter: Payer: Self-pay | Admitting: Physician Assistant

## 2023-06-18 ENCOUNTER — Ambulatory Visit (HOSPITAL_COMMUNITY)
Admission: RE | Admit: 2023-06-18 | Discharge: 2023-06-18 | Disposition: A | Discharge status: Home / Self Care | Payer: Medicare HMO | Source: Ambulatory Visit | Attending: Physician Assistant | Admitting: Physician Assistant

## 2023-06-18 DIAGNOSIS — R61 Generalized hyperhidrosis: Secondary | ICD-10-CM | POA: Insufficient documentation

## 2023-06-18 DIAGNOSIS — I700 Atherosclerosis of aorta: Secondary | ICD-10-CM | POA: Insufficient documentation

## 2023-06-18 DIAGNOSIS — R933 Abnormal findings on diagnostic imaging of other parts of digestive tract: Secondary | ICD-10-CM | POA: Insufficient documentation

## 2023-06-18 DIAGNOSIS — K2289 Other specified disease of esophagus: Secondary | ICD-10-CM | POA: Diagnosis not present

## 2023-06-18 MED ORDER — IOHEXOL 300 MG/ML  SOLN
100.0000 mL | Freq: Once | INTRAMUSCULAR | Status: AC | PRN
  Administered 2023-06-18: 100 mL via INTRAVENOUS

## 2023-06-22 ENCOUNTER — Telehealth: Payer: Self-pay | Admitting: Physician Assistant

## 2023-06-22 ENCOUNTER — Encounter: Payer: Self-pay | Admitting: Physician Assistant

## 2023-06-22 ENCOUNTER — Encounter: Payer: Self-pay | Admitting: Nurse Practitioner

## 2023-06-22 NOTE — Telephone Encounter (Signed)
Attempted to contact patient x2 to discuss results of CT CAP, HIPAA compliant voicemail left. Patient has previously responded to Mychart messages so new message sent. Patient informed there were no findings of lymphadenopathy in her chest, abdomen, or pelvis. With this result no additional oncology workup is needed. Patient can continue to follow PCP for further evaluation of her symptoms.   Also recommended patient follow up with GI for further evaluation of questionable   thickening of the gastroesophageal junction.

## 2023-06-30 ENCOUNTER — Encounter: Payer: Self-pay | Admitting: Cardiology

## 2023-06-30 DIAGNOSIS — I119 Hypertensive heart disease without heart failure: Secondary | ICD-10-CM

## 2023-06-30 MED ORDER — CARVEDILOL 12.5 MG PO TABS
12.5000 mg | ORAL_TABLET | Freq: Two times a day (BID) | ORAL | Status: DC
Start: 2023-06-30 — End: 2023-07-01

## 2023-07-01 ENCOUNTER — Other Ambulatory Visit: Payer: Self-pay

## 2023-07-01 DIAGNOSIS — I43 Cardiomyopathy in diseases classified elsewhere: Secondary | ICD-10-CM

## 2023-07-01 MED ORDER — CARVEDILOL 12.5 MG PO TABS
12.5000 mg | ORAL_TABLET | Freq: Two times a day (BID) | ORAL | 3 refills | Status: DC
Start: 2023-07-01 — End: 2023-07-03

## 2023-07-01 NOTE — Progress Notes (Signed)
Called [facility] and they never received the order that was sent 06/30/23. Resent the order and  messaged pt back

## 2023-07-03 ENCOUNTER — Telehealth: Payer: Self-pay | Admitting: Cardiology

## 2023-07-03 ENCOUNTER — Ambulatory Visit (INDEPENDENT_AMBULATORY_CARE_PROVIDER_SITE_OTHER): Payer: Medicare HMO

## 2023-07-03 VITALS — Ht 61.0 in | Wt 192.0 lb

## 2023-07-03 DIAGNOSIS — Z1231 Encounter for screening mammogram for malignant neoplasm of breast: Secondary | ICD-10-CM

## 2023-07-03 DIAGNOSIS — Z0000 Encounter for general adult medical examination without abnormal findings: Secondary | ICD-10-CM | POA: Diagnosis not present

## 2023-07-03 DIAGNOSIS — I43 Cardiomyopathy in diseases classified elsewhere: Secondary | ICD-10-CM

## 2023-07-03 MED ORDER — CARVEDILOL 12.5 MG PO TABS
12.5000 mg | ORAL_TABLET | Freq: Two times a day (BID) | ORAL | 2 refills | Status: DC
Start: 2023-07-03 — End: 2024-05-19

## 2023-07-03 NOTE — Telephone Encounter (Signed)
Pt's medication was sent to pt's pharmacy as requested. Confirmation received.  °

## 2023-07-03 NOTE — Progress Notes (Signed)
Subjective:   Leslie Griffin is a 72 y.o. female who presents for an Initial Medicare Annual Wellness Visit.  Visit Complete: Virtual  I connected with  Leslie Griffin on 07/03/23 by a audio enabled telemedicine application and verified that I am speaking with the correct person using two identifiers.  Patient Location: Home  Provider Location: Office/Clinic  I discussed the limitations of evaluation and management by telemedicine. The patient expressed understanding and agreed to proceed.  Patient Medicare AWV questionnaire was completed by the patient on 07/02/2023; I have confirmed that all information answered by patient is correct and no changes since this date.  Vital Signs: Because this visit was a virtual/telehealth visit, some criteria may be missing or patient reported. Any vitals not documented were not able to be obtained and vitals that have been documented are patient reported.    Review of Systems    Cardiac Risk Factors include: advanced age (>39men, >61 women);hypertension;dyslipidemia;Other (see comment);obesity (BMI >30kg/m2), Risk factor comments: Cardiomyopathy,     Objective:    Today's Vitals   07/02/23 1136 07/03/23 1108  Weight:  192 lb (87.1 kg)  Height:  5\' 1"  (1.549 m)  PainSc: 6     Body mass index is 36.28 kg/m.     07/03/2023   11:15 AM 03/18/2023    9:42 AM 05/13/2022    7:47 AM 10/29/2021    9:37 AM  Advanced Directives  Does Patient Have a Medical Advance Directive? Yes Yes No No  Type of Advance Directive Living will Living will;Healthcare Power of Attorney    Does patient want to make changes to medical advance directive?  No - Patient declined    Copy of Healthcare Power of Attorney in Chart? No - copy requested No - copy requested    Would patient like information on creating a medical advance directive?   No - Patient declined No - Patient declined    Current Medications (verified) Outpatient Encounter Medications as of 07/03/2023   Medication Sig   amLODipine (NORVASC) 5 MG tablet Take 1 tablet (5 mg total) by mouth daily.   carvedilol (COREG) 12.5 MG tablet Take 1 tablet (12.5 mg total) by mouth 2 (two) times daily.   cholecalciferol (VITAMIN D) 25 MCG (1000 UNIT) tablet Take 1,000 Units by mouth daily.   cyanocobalamin 1000 MCG tablet Take 1 tablet by mouth daily.   omeprazole (PRILOSEC) 20 MG capsule Take 1 capsule (20 mg total) by mouth daily. Please schedule appointment for additional refills.   rosuvastatin (CRESTOR) 10 MG tablet Take 1 tablet (10 mg total) by mouth daily.   traZODone (DESYREL) 50 MG tablet Take 1.5 tablets (75 mg total) by mouth at bedtime.   TURMERIC PO Take by mouth.   No facility-administered encounter medications on file as of 07/03/2023.    Allergies (verified) Benadryl [diphenhydramine hcl], Cephalexin, Duloxetine, Penicillins, Topiramate, and Sulfa antibiotics   History: Past Medical History:  Diagnosis Date   Allergic rhinitis    Anxiety    Arthritis    Family history of colonic polyps    Fibromyalgia    Heart murmur    History of colonic polyps    Hypercholesterolemia    Hypertension    Mitral valve prolapse    Ocular migraine    Palpitations    Past Surgical History:  Procedure Laterality Date   ABDOMINAL HYSTERECTOMY     age was in her 30s   COLONOSCOPY  10/05/2017   Mild sigmoid diverticulosis. Otherwise normal colonoscopy.  cyst removal arm Left 1981   from cat scratch fever   EYE SURGERY     bilateral cataract removal   FOOT ARTHRODESIS Left 11/01/2021   Procedure: LEFT TALONAVICULAR AND SUBTALAR FUSION;  Surgeon: Nadara Mustard, MD;  Location[facility];  Service: Orthopedics;  Laterality: Left;   Family History  Problem Relation Age of Onset   Coronary artery disease Other    Heart disease Mother    Heart disease Father    Colon polyps Sister    Other Sister        low hemoglobin   Colon cancer Neg Hx    Esophageal cancer Neg Hx    Rectal cancer Neg  Hx    Stomach cancer Neg Hx    Social History   Socioeconomic History   Marital status: Married    Spouse name: [name]   Number of children: 1   Years of education: Not on file   Highest education level: Associate degree: occupational, Scientist, product/process development, or vocational program  Occupational History   Occupation: Retired  Tobacco Use   Smoking status: Never   Smokeless tobacco: Never  Vaping Use   Vaping status: Never Used  Substance and Sexual Activity   Alcohol use: Yes    Comment: ocassionally    Drug use: No   Sexual activity: Not on file  Other Topics Concern   Not on file  Social History Narrative   Not on file   Social Determinants of Health   Financial Resource Strain: Low Risk  (07/02/2023)   Overall Financial Resource Strain (CARDIA)    Difficulty of Paying Living Expenses: Not hard at all  Food Insecurity: No Food Insecurity (07/02/2023)   Hunger Vital Sign    Worried About Running Out of Food in the Last Year: Never true    Ran Out of Food in the Last Year: Never true  Transportation Needs: No Transportation Needs (07/02/2023)   PRAPARE - Administrator, Civil Service (Medical): No    Lack of Transportation (Non-Medical): No  Physical Activity: Inactive (07/02/2023)   Exercise Vital Sign    Days of Exercise per Week: 0 days    Minutes of Exercise per Session: 0 min  Stress: Stress Concern Present (07/02/2023)   [employer] of Occupational Health - Occupational Stress Questionnaire    Feeling of Stress : Rather much  Social Connections: Moderately Integrated (07/02/2023)   Social Connection and Isolation Panel [NHANES]    Frequency of Communication with Friends and Family: More than three times a week    Frequency of Social Gatherings with Friends and Family: Once a week    Attends Religious Services: Never    Database administrator or Organizations: Yes    Attends Engineer, structural: More than 4 times per year    Marital Status: Married     Tobacco Counseling Counseling given: Not Answered   Clinical Intake:  Pre-visit preparation completed: Yes  Pain : 0-10 Pain Score: 6  Pain Type: Acute pain Pain Location: Heel Pain Orientation: Right Pain Descriptors / Indicators: Pressure, Aching Pain Onset: More than a month ago Pain Frequency: Constant Effect of Pain on Daily Activities: Hurts when she puts weight on it, walk on it     BMI - recorded: 36.28 Nutritional Risks: None Diabetes: No  How often do you need to have someone help you when you read instructions, pamphlets, or other written materials from your doctor or pharmacy?: 1 - Never  Interpreter  Needed?: No  Information entered by :: Kabeer Hoagland, RMA   Activities of Daily Living    07/02/2023   11:36 AM  In your present state of health, do you have any difficulty performing the following activities:  Hearing? 1  Vision? 1  Difficulty concentrating or making decisions? 0  Walking or climbing stairs? 1  Dressing or bathing? 0  Doing errands, shopping? 1  Preparing Food and eating ? N  Using the Toilet? N  In the past six months, have you accidently leaked urine? Y  Do you have problems with loss of bowel control? N  Managing your Medications? N  Managing your Finances? N  Housekeeping or managing your Housekeeping? Y    Patient Care Team: Elenore Paddy, NP as PCP - General (Nurse Practitioner) Little Ishikawa, MD as PCP - Cardiology (Cardiology)  Indicate any recent Medical Services you may have received from other than [facility] providers in the past year (date may be approximate).     Assessment:   This is a routine wellness examination for [place].  Hearing/Vision screen Hearing Screening - Comments:: Somewhat Vision Screening - Comments:: Denies vision issues  Dietary issues and exercise activities discussed:     Goals Addressed               This Visit's Progress     Patient Stated (pt-stated)        Would like  to stop night sweats.  Would like to find out how to stop them.      Depression Screen    07/03/2023   11:19 AM 04/27/2023    9:31 AM 03/20/2023    9:17 AM 03/10/2023   10:17 AM 03/06/2023    8:28 AM 12/12/2022   10:23 AM 12/04/2022   11:39 AM  PHQ 2/9 Scores  PHQ - 2 Score 1 0 0 2 2 0 4  PHQ- 9 Score 7  0 13 15 0 12    Fall Risk    07/02/2023   11:36 AM 04/27/2023    9:31 AM 03/20/2023    9:17 AM 03/10/2023   10:17 AM 03/06/2023    8:27 AM  Fall Risk   Falls in the past year? 0 0 0 0 0  Number falls in past yr: 0 0 0  0  Injury with Fall? 0 0 0  0  Risk for fall due to :   No Fall Risks  No Fall Risks  Risk for fall due to: Comment   cane    Follow up   Falls evaluation completed  Falls evaluation completed    MEDICARE RISK AT HOME:   TIMED UP AND GO:  Was the test performed? No    Cognitive Function:        07/03/2023   11:17 AM  6CIT Screen  What Year? 0 points  What month? 0 points  What time? 0 points  Count back from 20 0 points  Months in reverse 0 points  Repeat phrase 0 points  Total Score 0 points    Immunizations Immunization History  Administered Date(s) Administered   Fluad Quad(high Dose 65+) 09/04/2022   Influenza, High Dose Seasonal PF 08/18/2018, 08/02/2019   Moderna Sars-Covid-2 Vaccination 12/29/2019, 01/26/2020   Pneumococcal Conjugate-13 08/18/2018   Pneumococcal Polysaccharide-23 08/28/2017   Tdap 07/15/2011, 04/03/2020    TDAP status: Up to date  Flu Vaccine status: Due, Education has been provided regarding the importance of this vaccine. Advised may receive this vaccine at  local pharmacy or Health Dept. Aware to provide a copy of the vaccination record if obtained from local pharmacy or Health Dept. Verbalized acceptance and understanding.  Pneumococcal vaccine status: Up to date  Covid-19 vaccine status: Information provided on how to obtain vaccines.   Qualifies for Shingles Vaccine? Yes   Zostavax completed  per pt got it in  [place], [place]- [facility]   Shingrix Completed?: No.    Education has been provided regarding the importance of this vaccine. Patient has been advised to call insurance company to determine out of pocket expense if they have not yet received this vaccine. Advised may also receive vaccine at local pharmacy or Health Dept. Verbalized acceptance and understanding.  Screening Tests Health Maintenance  Topic Date Due   Hepatitis C Screening  Never done   MAMMOGRAM  Never done   Zoster Vaccines- Shingrix (1 of 2) Never done   COVID-19 Vaccine (3 - 2023-24 season) 07/18/2022   INFLUENZA VACCINE  06/18/2023   Medicare Annual Wellness (AWV)  07/02/2024   Colonoscopy  10/25/2026   DTaP/Tdap/Td (3 - Td or Tdap) 04/03/2030   Pneumonia Vaccine 49+ Years old  Completed   DEXA SCAN  Completed   HPV VACCINES  Aged Out    Health Maintenance  Health Maintenance Due  Topic Date Due   Hepatitis C Screening  Never done   MAMMOGRAM  Never done   Zoster Vaccines- Shingrix (1 of 2) Never done   COVID-19 Vaccine (3 - 2023-24 season) 07/18/2022   INFLUENZA VACCINE  06/18/2023    Colorectal cancer screening: Type of screening: Colonoscopy. Completed 10/26/1999. Repeat every 7 years  Mammogram status: Ordered 07/03/2023. Pt provided with contact info and advised to call to schedule appt.   Bone Density status: Completed 07/18/2022. Results reflect: Bone density results: OSTEOPENIA. Repeat every 2 years.  Lung Cancer Screening: (Low Dose CT Chest recommended if Age 26-80 years, 20 pack-year currently smoking OR have quit w/in 15years.) does not qualify.   Lung Cancer Screening Referral: N/A  Additional Screening:  Hepatitis C Screening: does qualify; Completed Never done.  Vision Screening: Recommended annual ophthalmology exams for early detection of glaucoma and other disorders of the eye. Is the patient up to date with their annual eye exam?  No  Who is the provider or what is the name of the office in  which the patient attends annual eye exams? [facility]  If pt is not established with a provider, would they like to be referred to a provider to establish care? Yes .   Dental Screening: Recommended annual dental exams for proper oral hygiene  Community Resource Referral / Chronic Care Management: CRR required this visit?  No   CCM required this visit?  No     Plan:     I have personally reviewed and noted the following in the patient's chart:   Medical and social history Use of alcohol, tobacco or illicit drugs  Current medications and supplements including opioid prescriptions. Patient is not currently taking opioid prescriptions. Functional ability and status Nutritional status Physical activity Advanced directives List of other physicians Hospitalizations, surgeries, and ER visits in previous 12 months Vitals Screenings to include cognitive, depression, and falls Referrals and appointments  In addition, I have reviewed and discussed with patient certain preventive protocols, quality metrics, and best practice recommendations. A written personalized care plan for preventive services as well as general preventive health recommendations were provided to patient.     Izzabella Besse L Rajni Holsworth, CMA  07/03/2023   After Visit Summary: (MyChart) Due to this being a telephonic visit, the after visit summary with patients personalized plan was offered to patient via MyChart   Nurse Notes: Patient is due for a Mammogram and referral has been placed.  She is due for Covid, Flu vaccines and a Hep C screening.  Patient stated that she has gotten a Shingles vaccine some years ago.  She is asking for a recommendation for eye care here in [place].  Patient has no other concerns to address today.

## 2023-07-03 NOTE — Telephone Encounter (Signed)
*  STAT* If patient is at the pharmacy, call can be transferred to refill team.   1. Which medications need to be refilled? (please list name of each medication and dose if known)   carvedilol (COREG) 12.5 MG tablet   2. Would you like to learn more about the convenience, safety, & potential cost savings by using the [facility]?   3. Are you open to using the [facility] (Type [facility]. ).  4. Which pharmacy/location (including street and city if local pharmacy) is medication to be sent to?  [id number] - [place], [place] - [address]   5. Do they need a 30 day or 90 day supply?   90 day  Patient stated she is almost out of this medication.  Patient has appointment on 9/11.

## 2023-07-03 NOTE — Patient Instructions (Addendum)
Leslie Griffin , Thank you for taking time to come for your Medicare Wellness Visit. I appreciate your ongoing commitment to your health goals. Please review the following plan we discussed and let me know if I can assist you in the future.   Referrals/Orders/Follow-Ups/Clinician Recommendations: You are due for a Covid and Flu vaccine coming up this Fall.  You can get these done at your local pharmacy.  A referral has been placed for a Mammogram screening at [facility].  Please call to schedule an appointment [phone].  This is a list of the screening recommended for you and due dates:  Health Maintenance  Topic Date Due   Hepatitis C Screening  Never done   Mammogram  Never done   Zoster (Shingles) Vaccine (1 of 2) Never done   COVID-19 Vaccine (3 - 2023-24 season) 07/18/2022   Flu Shot  06/18/2023   Medicare Annual Wellness Visit  07/02/2024   Colon Cancer Screening  10/25/2026   DTaP/Tdap/Td vaccine (3 - Td or Tdap) 04/03/2030   Pneumonia Vaccine  Completed   DEXA scan (bone density measurement)  Completed   HPV Vaccine  Aged Out    Advanced directives: (In Chart) A copy of your advanced directives are scanned into your chart should your provider ever need it.  Next Medicare Annual Wellness Visit scheduled for next year: Yes  Preventive Care 32 Years and Older, Female Preventive care refers to lifestyle choices and visits with your health care provider that can promote health and wellness. What does preventive care include? A yearly physical exam. This is also called an annual well check. Dental exams once or twice a year. Routine eye exams. Ask your health care provider how often you should have your eyes checked. Personal lifestyle choices, including: Daily care of your teeth and gums. Regular physical activity. Eating a healthy diet. Avoiding tobacco and drug use. Limiting alcohol use. Practicing safe sex. Taking low-dose aspirin every  day. Taking vitamin and mineral supplements as recommended by your health care provider. What happens during an annual well check? The services and screenings done by your health care provider during your annual well check will depend on your age, overall health, lifestyle risk factors, and family history of disease. Counseling  Your health care provider may ask you questions about your: Alcohol use. Tobacco use. Drug use. Emotional well-being. Home and relationship well-being. Sexual activity. Eating habits. History of falls. Memory and ability to understand (cognition). Work and work Astronomer. Reproductive health. Screening  You may have the following tests or measurements: Height, weight, and BMI. Blood pressure. Lipid and cholesterol levels. These may be checked every 5 years, or more frequently if you are over 16 years old. Skin check. Lung cancer screening. You may have this screening every year starting at age 30 if you have a 30-pack-year history of smoking and currently smoke or have quit within the past 15 years. Fecal occult blood test (FOBT) of the stool. You may have this test every year starting at age 73. Flexible sigmoidoscopy or colonoscopy. You may have a sigmoidoscopy every 5 years or a colonoscopy every 10 years starting at age 10. Hepatitis C blood test. Hepatitis B blood test. Sexually transmitted disease (STD) testing. Diabetes screening. This is done by checking your blood sugar (glucose) after you have not eaten for a while (fasting). You may have this done every 1-3 years. Bone density scan. This is done to screen for osteoporosis. You may have this  done starting at age 46. Mammogram. This may be done every 1-2 years. Talk to your health care provider about how often you should have regular mammograms. Talk with your health care provider about your test results, treatment options, and if necessary, the need for more tests. Vaccines  Your health care  provider may recommend certain vaccines, such as: Influenza vaccine. This is recommended every year. Tetanus, diphtheria, and acellular pertussis (Tdap, Td) vaccine. You may need a Td booster every 10 years. Zoster vaccine. You may need this after age 76. Pneumococcal 13-valent conjugate (PCV13) vaccine. One dose is recommended after age 9. Pneumococcal polysaccharide (PPSV23) vaccine. One dose is recommended after age 77. Talk to your health care provider about which screenings and vaccines you need and how often you need them. This information is not intended to replace advice given to you by your health care provider. Make sure you discuss any questions you have with your health care provider. Document Released: 11/30/2015 Document Revised: 07/23/2016 Document Reviewed: 09/04/2015 Elsevier Interactive Patient Education  2017 [employer].  Fall Prevention in the Home Falls can cause injuries. They can happen to people of all ages. There are many things you can do to make your home safe and to help prevent falls. What can I do on the outside of my home? Regularly fix the edges of walkways and driveways and fix any cracks. Remove anything that might make you trip as you walk through a door, such as a raised step or threshold. Trim any bushes or trees on the path to your home. Use bright outdoor lighting. Clear any walking paths of anything that might make someone trip, such as rocks or tools. Regularly check to see if handrails are loose or broken. Make sure that both sides of any steps have handrails. Any raised decks and porches should have guardrails on the edges. Have any leaves, snow, or ice cleared regularly. Use sand or salt on walking paths during winter. Clean up any spills in your garage right away. This includes oil or grease spills. What can I do in the bathroom? Use night lights. Install grab bars by the toilet and in the tub and shower. Do not use towel bars as grab  bars. Use non-skid mats or decals in the tub or shower. If you need to sit down in the shower, use a plastic, non-slip stool. Keep the floor dry. Clean up any water that spills on the floor as soon as it happens. Remove soap buildup in the tub or shower regularly. Attach bath mats securely with double-sided non-slip rug tape. Do not have throw rugs and other things on the floor that can make you trip. What can I do in the bedroom? Use night lights. Make sure that you have a light by your bed that is easy to reach. Do not use any sheets or blankets that are too big for your bed. They should not hang down onto the floor. Have a firm chair that has side arms. You can use this for support while you get dressed. Do not have throw rugs and other things on the floor that can make you trip. What can I do in the kitchen? Clean up any spills right away. Avoid walking on wet floors. Keep items that you use a lot in easy-to-reach places. If you need to reach something above you, use a strong step stool that has a grab bar. Keep electrical cords out of the way. Do not use floor polish or wax  that makes floors slippery. If you must use wax, use non-skid floor wax. Do not have throw rugs and other things on the floor that can make you trip. What can I do with my stairs? Do not leave any items on the stairs. Make sure that there are handrails on both sides of the stairs and use them. Fix handrails that are broken or loose. Make sure that handrails are as long as the stairways. Check any carpeting to make sure that it is firmly attached to the stairs. Fix any carpet that is loose or worn. Avoid having throw rugs at the top or bottom of the stairs. If you do have throw rugs, attach them to the floor with carpet tape. Make sure that you have a light switch at the top of the stairs and the bottom of the stairs. If you do not have them, ask someone to add them for you. What else can I do to help prevent  falls? Wear shoes that: Do not have high heels. Have rubber bottoms. Are comfortable and fit you well. Are closed at the toe. Do not wear sandals. If you use a stepladder: Make sure that it is fully opened. Do not climb a closed stepladder. Make sure that both sides of the stepladder are locked into place. Ask someone to hold it for you, if possible. Clearly mark and make sure that you can see: Any grab bars or handrails. First and last steps. Where the edge of each step is. Use tools that help you move around (mobility aids) if they are needed. These include: Canes. Walkers. Scooters. Crutches. Turn on the lights when you go into a dark area. Replace any light bulbs as soon as they burn out. Set up your furniture so you have a clear path. Avoid moving your furniture around. If any of your floors are uneven, fix them. If there are any pets around you, be aware of where they are. Review your medicines with your doctor. Some medicines can make you feel dizzy. This can increase your chance of falling. Ask your doctor what other things that you can do to help prevent falls. This information is not intended to replace advice given to you by your health care provider. Make sure you discuss any questions you have with your health care provider. Document Released: 08/30/2009 Document Revised: 04/10/2016 Document Reviewed: 12/08/2014 Elsevier Interactive Patient Education  2017 [employer].

## 2023-07-04 ENCOUNTER — Other Ambulatory Visit: Payer: Self-pay | Admitting: Nurse Practitioner

## 2023-07-04 DIAGNOSIS — G4700 Insomnia, unspecified: Secondary | ICD-10-CM

## 2023-07-11 ENCOUNTER — Encounter: Payer: Self-pay | Admitting: Nurse Practitioner

## 2023-07-29 ENCOUNTER — Encounter: Payer: Self-pay | Admitting: Cardiology

## 2023-07-29 ENCOUNTER — Ambulatory Visit: Payer: Medicare HMO | Attending: Cardiology | Admitting: Cardiology

## 2023-07-29 VITALS — BP 123/72 | HR 62 | Ht 61.0 in | Wt 183.0 lb

## 2023-07-29 DIAGNOSIS — R002 Palpitations: Secondary | ICD-10-CM | POA: Diagnosis not present

## 2023-07-29 DIAGNOSIS — I422 Other hypertrophic cardiomyopathy: Secondary | ICD-10-CM | POA: Diagnosis not present

## 2023-07-29 DIAGNOSIS — R400 Somnolence: Secondary | ICD-10-CM | POA: Diagnosis not present

## 2023-07-29 DIAGNOSIS — I10 Essential (primary) hypertension: Secondary | ICD-10-CM

## 2023-07-29 DIAGNOSIS — R079 Chest pain, unspecified: Secondary | ICD-10-CM

## 2023-07-29 DIAGNOSIS — E785 Hyperlipidemia, unspecified: Secondary | ICD-10-CM | POA: Diagnosis not present

## 2023-07-29 DIAGNOSIS — R0683 Snoring: Secondary | ICD-10-CM | POA: Diagnosis not present

## 2023-07-29 NOTE — Progress Notes (Unsigned)
Cardiology Office Note:    Date:  07/30/2023   ID:  Leslie Griffin, DOB 11-04-1951, MRN [id number]  PCP:  Elenore Paddy, NP  Cardiologist:  Little Ishikawa, MD  Electrophysiologist:  None   Referring MD: Elenore Paddy, NP   Chief Complaint  Patient presents with   Follow-up    6 months.   Headache   Chest Pain    At times.    History of Present Illness:    Leslie Griffin is a 72 y.o. female with a hx of HCM, hypertension, hyperlipidemia, fibromyalgia, MVP who presents for follow-up.  She was referred by Jiles Prows, NP for evaluation of chest pain, initially seen on 04/28/2022.  She was seen in the ED for chest pain on 04/11/2022, work-up unremarkable.  She reports she has been having chest pain off and on since 5/26.  Feels sharp pain in the left center chest, lasts for about 30 minutes or so.  Occurs within 5 minutes of eating.  However has also noted chest pain when exerting herself.  Does report having shortness of breath.  Has also had episodes of lightheadedness but no syncope.  Reports has been having palpitations occurring during episodes as well.  No smoking history.  Family history includes both parents died from MI around age 20, brother had CABG in 39s.  Echocardiogram 05/05/2022 showed severe asymmetric hypertrophy of the septum, peak gradient across LVOT with Valsalva 20 mmHg, normal biventricular function, mild AI/MR.  Zio patch x7 days on 05/15/2022 showed no significant arrhythmias.  Coronary CTA 05/21/2022 showed calcium score 1 (40th percentile), minimal nonobstructive plaque in LAD.  Cardiac MRI 07/23/2022 showed asymmetric LV hypertrophy measuring 16 mm in basal septum (10 mm in posterior wall), meeting criteria for hypertrophic dermopathy, no LGE, normal biventricular size and systolic function, mild mitral regurgitation (regurgitant fraction 11%).  Since last clinic visit, she reports she is doing well.  States that her edema resolved.  Has had no recent palpitations.   Reports some lightheadedness but denies any syncope.  No shortness of breath.  Reports that chest pain occurs about once per week, describes dull aching pain that occurs at rest.  She has not been exercising due to back pain.  However recently went to a concert and danced for hours, denied any exertional symptoms.  Wt Readings from Last 3 Encounters:  07/29/23 183 lb (83 kg)  07/03/23 192 lb (87.1 kg)  04/27/23 192 lb (87.1 kg)      Past Medical History:  Diagnosis Date   Allergic rhinitis    Anxiety    Arthritis    Family history of colonic polyps    Fibromyalgia    Heart murmur    History of colonic polyps    Hypercholesterolemia    Hypertension    Mitral valve prolapse    Ocular migraine    Palpitations     Past Surgical History:  Procedure Laterality Date   ABDOMINAL HYSTERECTOMY     age was in her 30s   COLONOSCOPY  10/05/2017   Mild sigmoid diverticulosis. Otherwise normal colonoscopy.    cyst removal arm Left 1981   from cat scratch fever   EYE SURGERY     bilateral cataract removal   FOOT ARTHRODESIS Left 11/01/2021   Procedure: LEFT TALONAVICULAR AND SUBTALAR FUSION;  Surgeon: Nadara Mustard, MD;  Location: [facility] OR;  Service: Orthopedics;  Laterality: Left;    Current Medications: Current Meds  Medication Sig   amLODipine (NORVASC) 5 MG  tablet Take 1 tablet (5 mg total) by mouth daily.   carvedilol (COREG) 12.5 MG tablet Take 1 tablet (12.5 mg total) by mouth 2 (two) times daily.   cholecalciferol (VITAMIN D) 25 MCG (1000 UNIT) tablet Take 1,000 Units by mouth daily.   cyanocobalamin 1000 MCG tablet Take 1 tablet by mouth daily.   omeprazole (PRILOSEC) 20 MG capsule Take 1 capsule (20 mg total) by mouth daily. Please schedule appointment for additional refills.   rosuvastatin (CRESTOR) 10 MG tablet Take 1 tablet (10 mg total) by mouth daily.   traZODone (DESYREL) 50 MG tablet TAKE 1.5 TABLETS BY MOUTH AT BEDTIME.   TURMERIC PO Take by mouth.     Allergies:    Benadryl [diphenhydramine hcl], Cephalexin, Duloxetine, Penicillins, Topiramate, and Sulfa antibiotics   Social History   Socioeconomic History   Marital status: Married    Spouse name: [name]   Number of children: 1   Years of education: Not on file   Highest education level: Associate degree: occupational, Scientist, product/process development, or vocational program  Occupational History   Occupation: Retired  Tobacco Use   Smoking status: Never   Smokeless tobacco: Never  Vaping Use   Vaping status: Never Used  Substance and Sexual Activity   Alcohol use: Yes    Comment: ocassionally    Drug use: No   Sexual activity: Not on file  Other Topics Concern   Not on file  Social History Narrative   Not on file   Social Determinants of Health   Financial Resource Strain: Low Risk  (07/02/2023)   Overall Financial Resource Strain (CARDIA)    Difficulty of Paying Living Expenses: Not hard at all  Food Insecurity: No Food Insecurity (07/02/2023)   Hunger Vital Sign    Worried About Running Out of Food in the Last Year: Never true    Ran Out of Food in the Last Year: Never true  Transportation Needs: No Transportation Needs (07/02/2023)   PRAPARE - Administrator, Civil Service (Medical): No    Lack of Transportation (Non-Medical): No  Physical Activity: Inactive (07/02/2023)   Exercise Vital Sign    Days of Exercise per Week: 0 days    Minutes of Exercise per Session: 0 min  Stress: Stress Concern Present (07/02/2023)   [employer] of Occupational Health - Occupational Stress Questionnaire    Feeling of Stress : Rather much  Social Connections: Moderately Integrated (07/02/2023)   Social Connection and Isolation Panel [NHANES]    Frequency of Communication with Friends and Family: More than three times a week    Frequency of Social Gatherings with Friends and Family: Once a week    Attends Religious Services: Never    Database administrator or Organizations: Yes    Attends Museum/gallery exhibitions officer: More than 4 times per year    Marital Status: Married     Family History: The patient's family history includes Colon polyps in her sister; Coronary artery disease in an other family member; Heart disease in her father and mother; Other in her sister. There is no history of Colon cancer, Esophageal cancer, Rectal cancer, or Stomach cancer.  ROS:   Please see the history of present illness.     All other systems reviewed and are negative.  EKGs/Labs/Other Studies Reviewed:    The following studies were reviewed today:   EKG:   04/28/2022: Normal sinus rhythm, 89, LVH, Q waves in leads III, aVF, less than 1 mm ST  depressions in leads V5/6 07/29/23: NSR, rate 62, no ST abnormalities   Recent Labs: 01/27/2023: BNP 8.8 03/06/2023: TSH 2.89 06/10/2023: ALT 14; BUN 10; Creatinine 0.58; Hemoglobin 12.6; Platelet Count 210; Potassium 4.0; Sodium 139  Recent Lipid Panel    Component Value Date/Time   CHOL 185 07/29/2022 1007   TRIG 82 07/29/2022 1007   HDL 92 07/29/2022 1007   CHOLHDL 2.0 07/29/2022 1007   CHOLHDL 2 06/06/2022 0909   VLDL 13.6 06/06/2022 0909   LDLCALC 78 07/29/2022 1007    Physical Exam:    VS:  BP 123/72 (BP Location: Left Wrist, Patient Position: Sitting, Cuff Size: Normal)   Pulse 62   Ht 5\' 1"  (1.549 m)   Wt 183 lb (83 kg)   BMI 34.58 kg/m     Wt Readings from Last 3 Encounters:  07/29/23 183 lb (83 kg)  07/03/23 192 lb (87.1 kg)  04/27/23 192 lb (87.1 kg)     GEN:  in no acute distress HEENT: Normal NECK: No JVD; No carotid bruits CARDIAC: RRR, 2 out of 6 systolic murmur RESPIRATORY:  Clear to auscultation without rales, wheezing or rhonchi  ABDOMEN: Soft, non-tender, non-distended MUSCULOSKELETAL: Trace edema; No deformity  SKIN: Warm and dry NEUROLOGIC:  Alert and oriented x 3 PSYCHIATRIC:  Normal affect   ASSESSMENT:    1. Hypertrophic cardiomyopathy (HCC)   2. Chest pain of uncertain etiology   3. Palpitations   4.  Snoring   5. Daytime somnolence   6. Essential hypertension   7. Hyperlipidemia, unspecified hyperlipidemia type     PLAN:    Hypertrophic cardiomyopathy: Echocardiogram 05/05/2022 showed severe asymmetric hypertrophy of the septum, peak gradient across LVOT with Valsalva 20 mmHg, normal biventricular function, mild AI/MR.  Zio patch x7 days on 05/15/2022 showed no significant arrhythmias.  Cardiac MRI 07/23/2022 showed asymmetric LV hypertrophy measuring 16 mm in basal septum (10 mm in posterior wall), meeting criteria for hypertrophic dermopathy, no LGE, normal biventricular size and systolic function, mild mitral regurgitation (regurgitant fraction 11%). -Recommend first-degree relatives be screened.  She has spoken to her son about this -Continue carvedilol 6.25 mg twice daily  Lower extremity edema: Reports bilateral swelling in ankles.  Suspect due to amlodipine use.  Normal BNP, CMET 01/2023 -Reports has resolved, no edema on exam today  Chest pain: Atypical in description.  Coronary CTA 05/21/2022 showed calcium score 1 (40th percentile), minimal nonobstructive plaque in LAD.   -Given pain can also occur after eating, could be related to GERD and recommended starting on PPI to see if improvement - Reports chest pain has resolved  Palpitations: Zio patch x7 days on 05/15/2022 showed no significant arrhythmias.  Hypertension: On carvedilol 6.25 mg twice daily and amlodipine 10 mg daily.  Appears controlled  Hyperlipidemia: LDL 166 on 08/22/2021.  10-year ASCVD risk score 10%.  Started rosuvastatin 10 mg daily.  LDL 78 on 07/29/2022  Snoring/daytime somnolence: Check Itamar sleep study.  STOP-BANG 4  RTC in 6 months  Medication Adjustments/Labs and Tests Ordered: Current medicines are reviewed at length with the patient today.  Concerns regarding medicines are outlined above.  Orders Placed This Encounter  Procedures   EKG 12-Lead   EKG 12-Lead   Itamar Sleep Study   No orders of the  defined types were placed in this encounter.   Patient Instructions  Medication Instructions:  The current medical regimen is effective;  continue present plan and medications as directed. Please refer to the Current Medication list given  to you today.  *If you need a refill on your cardiac medications before your next appointment, please call your pharmacy*  Lab Work: NONE If you have labs (blood work) drawn today and your tests are completely normal, you will receive your results only by:  MyChart Message (if you have MyChart) OR  A paper copy in the mail If you have any lab test that is abnormal or we need to change your treatment, we will call you to review the results.  Testing/Procedures: home sleep study test-SEE BELOW   Follow-Up: At [facility], you and your health needs are our priority.  As part of our continuing mission to provide you with exceptional heart care, we have created designated Provider Care Teams.  These Care Teams include your primary Cardiologist (physician) and Advanced Practice Providers (APPs -  Physician Assistants and Nurse Practitioners) who all work together to provide you with the care you need, when you need it.  Your next appointment:   6 month(s)  Provider:   Epifanio Lesches, MD  Other Instructions WatchPAT?  Is a FDA cleared portable home sleep study test that uses a watch and 3 points of contact to monitor 7 different channels, including your heart rate, oxygen saturations, body position, snoring, and chest motion.  The study is easy to use from the comfort of your own home and accurately detect sleep apnea.  Before bed, you attach the chest sensor, attached the sleep apnea bracelet to your nondominant hand, and attach the finger probe.  After the study, the raw data is downloaded from the watch and scored for apnea events.   For more information: [email or web address]  Patient Testing Instructions:  Do not put  battery into the device until bedtime when you are ready to begin the test. Please call the support number if you need assistance after following the instructions below: 24 hour support line- [phone] or ITAMAR support at [phone] (option 2)  Download the IntelWatchPAT One" app through the google play store or App Store  Be sure to turn on or enable access to bluetooth in settlings on your smartphone/ device  Make sure no other bluetooth devices are on and within the vicinity of your smartphone/ device and WatchPAT watch during testing.  Make sure to leave your smart phone/ device plugged in and charging all night.  When ready for bed:  Follow the instructions step by step in the WatchPAT One App to activate the testing device. For additional instructions, including video instruction, visit the WatchPAT One video on Youtube. You can search for WatchPat One within Youtube (video is 4 minutes and 18 seconds) or enter: https://youtube/[email or web address]?[email or web address]_[email or web address] Please note: You will be prompted to enter a Pin to connect via bluetooth when starting the test. The PIN will be assigned to you when you receive the test.  The device is disposable, but it recommended that you retain the device until you receive a call letting you know the study has been received and the results have been interpreted.  We will let you know if the study did not transmit to [place] properly after the test is completed. You do not need to call us to confirm the receipt of the test.  Please complete the test within 48 hours of receiving PIN.   Frequently Asked Questions:  What is Watch Dennie Bible one?  A single use fully disposable home sleep apnea testing device and will not need to be returned after completion.  What are the requirements to use  WatchPAT one?  The be able to have a successful watchpat one sleep study, you should have your Watch pat one device, your smart phone, watch pat one app, your PIN number and Internet access What  type of phone do I need?  You should have a smart phone that uses Android 5.1 and above or any Iphone with IOS 10 and above How can I download the WatchPAT one app?  Based on your device type search for WatchPAT one app either in google play for android devices or APP store for Iphone's Where will I get my PIN for the study?  Your PIN will be provided by your physician's office. It is used for authentication and if you lose/forget your PIN, please reach out to your providers office.  I do not have Internet at home. Can I do WatchPAT one study?  WatchPAT One needs Internet connection throughout the night to be able to transmit the sleep data. You can use your home/local internet or your cellular's data package. However, it is always recommended to use home/local Internet. It is estimated that between 20MB-30MB will be used with each study.However, the application will be looking for space in the phone to start the study.  What happens if I lose internet or bluetooth connection?  During the internet disconnection, your phone will not be able to transmit the sleep data. All the data, will be stored in your phone. As soon as the internet connection is back on, the phone will being sending the sleep data. During the bluetooth disconnection, WatchPAT one will not be able to to send the sleep data to your phone. Data will be kept in the [facility] one until two devices have bluetooth connection back on. As soon as the connection is back on, WatchPAT one will send the sleep data to the phone.  How long do I need to wear the WatchPAT one?  After you start the study, you should wear the device at least 6 hours.  How far should I keep my phone from the device?  During the night, your phone should be within 15 feet.  What happens if I leave the room for restroom or other reasons?  Leaving the room for any reason will not cause any problem. As soon as your get back to the room, both devices will reconnect and  will continue to send the sleep data. Can I use my phone during the sleep study?  Yes, you can use your phone as usual during the study. But it is recommended to put your watchpat one on when you are ready to go to bed.  How will I get my study results?  A soon as you completed your study, your sleep data will be sent to the provider. They will then share the results with you when they are ready.      Signed, Little Ishikawa, MD  07/30/2023 11:01 AM    [facility]

## 2023-07-29 NOTE — Patient Instructions (Addendum)
Medication Instructions:  The current medical regimen is effective;  continue present plan and medications as directed. Please refer to the Current Medication list given to you today.  *If you need a refill on your cardiac medications before your next appointment, please call your pharmacy*  Lab Work: NONE If you have labs (blood work) drawn today and your tests are completely normal, you will receive your results only by:  MyChart Message (if you have MyChart) OR  A paper copy in the mail If you have any lab test that is abnormal or we need to change your treatment, we will call you to review the results.  Testing/Procedures: home sleep study test-SEE BELOW   Follow-Up: At [facility], you and your health needs are our priority.  As part of our continuing mission to provide you with exceptional heart care, we have created designated Provider Care Teams.  These Care Teams include your primary Cardiologist (physician) and Advanced Practice Providers (APPs -  Physician Assistants and Nurse Practitioners) who all work together to provide you with the care you need, when you need it.  Your next appointment:   6 month(s)  Provider:   Epifanio Lesches, MD  Other Instructions WatchPAT?  Is a FDA cleared portable home sleep study test that uses a watch and 3 points of contact to monitor 7 different channels, including your heart rate, oxygen saturations, body position, snoring, and chest motion.  The study is easy to use from the comfort of your own home and accurately detect sleep apnea.  Before bed, you attach the chest sensor, attached the sleep apnea bracelet to your nondominant hand, and attach the finger probe.  After the study, the raw data is downloaded from the watch and scored for apnea events.   For more information: [email or web address]  Patient Testing Instructions:  Do not put battery into the device until bedtime when you are ready to begin the  test. Please call the support number if you need assistance after following the instructions below: 24 hour support line- [phone] or ITAMAR support at [phone] (option 2)  Download the IntelWatchPAT One" app through the google play store or App Store  Be sure to turn on or enable access to bluetooth in settlings on your smartphone/ device  Make sure no other bluetooth devices are on and within the vicinity of your smartphone/ device and WatchPAT watch during testing.  Make sure to leave your smart phone/ device plugged in and charging all night.  When ready for bed:  Follow the instructions step by step in the WatchPAT One App to activate the testing device. For additional instructions, including video instruction, visit the WatchPAT One video on Youtube. You can search for WatchPat One within Youtube (video is 4 minutes and 18 seconds) or enter: https://youtube/watch?[email or web address]_[email or web address] Please note: You will be prompted to enter a Pin to connect via bluetooth when starting the test. The PIN will be assigned to you when you receive the test.  The device is disposable, but it recommended that you retain the device until you receive a call letting you know the study has been received and the results have been interpreted.  We will let you know if the study did not transmit to [place] properly after the test is completed. You do not need to call us to confirm the receipt of the test.  Please complete the test within 48 hours of receiving PIN.   Frequently Asked Questions:  What is Watch Dennie Bible one?  A  single use fully disposable home sleep apnea testing device and will not need to be returned after completion.  What are the requirements to use WatchPAT one?  The be able to have a successful watchpat one sleep study, you should have your Watch pat one device, your smart phone, watch pat one app, your PIN number and Internet access What type of phone do I need?  You should have a smart phone that uses  Android 5.1 and above or any Iphone with IOS 10 and above How can I download the WatchPAT one app?  Based on your device type search for WatchPAT one app either in google play for android devices or APP store for Iphone's Where will I get my PIN for the study?  Your PIN will be provided by your physician's office. It is used for authentication and if you lose/forget your PIN, please reach out to your providers office.  I do not have Internet at home. Can I do WatchPAT one study?  WatchPAT One needs Internet connection throughout the night to be able to transmit the sleep data. You can use your home/local internet or your cellular's data package. However, it is always recommended to use home/local Internet. It is estimated that between 20MB-30MB will be used with each study.However, the application will be looking for space in the phone to start the study.  What happens if I lose internet or bluetooth connection?  During the internet disconnection, your phone will not be able to transmit the sleep data. All the data, will be stored in your phone. As soon as the internet connection is back on, the phone will being sending the sleep data. During the bluetooth disconnection, WatchPAT one will not be able to to send the sleep data to your phone. Data will be kept in the [facility] one until two devices have bluetooth connection back on. As soon as the connection is back on, WatchPAT one will send the sleep data to the phone.  How long do I need to wear the WatchPAT one?  After you start the study, you should wear the device at least 6 hours.  How far should I keep my phone from the device?  During the night, your phone should be within 15 feet.  What happens if I leave the room for restroom or other reasons?  Leaving the room for any reason will not cause any problem. As soon as your get back to the room, both devices will reconnect and will continue to send the sleep data. Can I use my phone during  the sleep study?  Yes, you can use your phone as usual during the study. But it is recommended to put your watchpat one on when you are ready to go to bed.  How will I get my study results?  A soon as you completed your study, your sleep data will be sent to the provider. They will then share the results with you when they are ready.

## 2023-07-30 NOTE — Progress Notes (Signed)
Patient agreement reviewed and signed on 07/29/2023.  WatchPAT issued to patient on 07/29/2023 by Brunetta Genera, CMA. Patient aware to not open the WatchPAT box until contacted with the activation PIN. Patient profile initialized in CloudPAT on 07/29/2023 by Brunetta Genera, CMA. Device serial number: [id number]

## 2023-08-17 ENCOUNTER — Encounter: Payer: Self-pay | Admitting: Physical Medicine & Rehabilitation

## 2023-08-17 ENCOUNTER — Encounter: Payer: Medicare HMO | Attending: Physical Medicine & Rehabilitation | Admitting: Physical Medicine & Rehabilitation

## 2023-08-17 VITALS — BP 96/63 | HR 62 | Ht 61.0 in | Wt 185.0 lb

## 2023-08-17 DIAGNOSIS — M797 Fibromyalgia: Secondary | ICD-10-CM | POA: Insufficient documentation

## 2023-08-17 DIAGNOSIS — M542 Cervicalgia: Secondary | ICD-10-CM | POA: Diagnosis not present

## 2023-08-17 DIAGNOSIS — G8929 Other chronic pain: Secondary | ICD-10-CM | POA: Insufficient documentation

## 2023-08-17 DIAGNOSIS — M545 Low back pain, unspecified: Secondary | ICD-10-CM | POA: Diagnosis not present

## 2023-08-17 NOTE — Progress Notes (Signed)
Subjective:    Patient ID: Leslie Griffin, female    DOB: [birth year], 71 y.o.   MRN: [id number]  HPI Leslie Griffin is a 72 y.o. year old female  who  has a past medical history of Allergic rhinitis, Anxiety, Arthritis, Family history of colonic polyps, Fibromyalgia, Heart murmur, History of colonic polyps, Hypercholesterolemia, Hypertension, Mitral valve prolapse, Ocular migraine, and Palpitations.   They are presenting to PM&R clinic as a new patient for pain management evaluation.  Patient reports she has had severe pain for over 30 years.  She was diagnosed with fibromyalgia many years ago.  Patient reports she was previously on fibromyalgia medication such as gabapentin and Lyrica but both of these quit working.  She was unable to tolerate Cymbalta.  She says her pain has recently worsened due to areas of arthritis that have occurred over the past several years.  She had a left talonavicular and subtalar fusion in December 2022.  she is continue to have pain in her foot since the operation.  Currently her greatest pain is in her lower back, shoulders, right hip, right knee.  Her neck is also painful.  Pain is worsened with activity.  She feels like her mood is decreased due to her pain.  She feels that pain is severely limiting her activities.  She tries to do housework at home but this is very painful.  She has been seen by orthopedics for left rotator cuff tear.     Red flag symptoms: No red flags for back pain endorsed in Hx or ROS     Medications tried: Topical medications- arthritis cream, blu emu didn't help Nsaids doesn't help, ibuprofen Tylenol  doesn't help Opiates  Hydrocodone quit working Oxycodone-mild benefit Tramadol didn't help  Gabapentin - for a few years, was helping at first but stopped  Lyrica -  quit working  TCAs  amitripyline, doesn't recall SNRIs  - Rash with cymbalta and shortness of breath Milnacipran- recalls the name but does not recall trying this CBD oils  didn't help   Other treatments: PT/OT  - Reports completed this recently for her shoulder TENs unit - has not tried  Injections - Reports nerve ablasion of her back  in [place] or [place], C4=5, prior shoulder injections.  She reports minimal benefit with injections Surgery - L ankle surgery     Interval history 04/27/23 Patient is here for f/u regarding her chronic pain. She reports her pain is improved with aquatherapy. She continues to have increased pain especially when the weather changes.  Pain is worsened by walking increased distances. She is also taking tumaric and this is helping her pain.      Interval history 08/17/2023 Ms. Fellows is here for follow-up regarding her chronic pain.  She reports she continues to have severe pain in her back and neck.  She received TENS unit but plans on returning due to cost.  He completed aquatic therapy, she is unsure if this is having a lasting benefit.  Pain is intermittent and often she will do activities until the pain becomes too severe and then she takes a break.  After taking a break she tries to go back to her activities.  She is not interested in back injections at this time, she reports prior back injections facet RFA?  Only helped for about a day.  She is not interested in any new medications or injections at this time.   Pain Inventory Average Pain 7 Pain Right Now 2 My  pain is sharp and aching  In the last 24 hours, has pain interfered with the following? General activity 3 Relation with others 4 Enjoyment of life 4 What TIME of day is your pain at its worst? morning  Sleep (in general) Fair  Pain is worse with: walking and standing Pain improves with: rest Relief from Meds:  na  Family History  Problem Relation Age of Onset   Coronary artery disease Other    Heart disease Mother    Heart disease Father    Colon polyps Sister    Other Sister        low hemoglobin   Colon cancer Neg Hx    Esophageal cancer Neg  Hx    Rectal cancer Neg Hx    Stomach cancer Neg Hx    Social History   Socioeconomic History   Marital status: Married    Spouse name: [name]   Number of children: 1   Years of education: Not on file   Highest education level: Associate degree: occupational, Scientist, product/process development, or vocational program  Occupational History   Occupation: Retired  Tobacco Use   Smoking status: Never   Smokeless tobacco: Never  Vaping Use   Vaping status: Never Used  Substance and Sexual Activity   Alcohol use: Yes    Comment: ocassionally    Drug use: No   Sexual activity: Not on file  Other Topics Concern   Not on file  Social History Narrative   Not on file   Social Determinants of Health   Financial Resource Strain: Low Risk  (07/02/2023)   Overall Financial Resource Strain (CARDIA)    Difficulty of Paying Living Expenses: Not hard at all  Food Insecurity: No Food Insecurity (07/02/2023)   Hunger Vital Sign    Worried About Running Out of Food in the Last Year: Never true    Ran Out of Food in the Last Year: Never true  Transportation Needs: No Transportation Needs (07/02/2023)   PRAPARE - Administrator, Civil Service (Medical): No    Lack of Transportation (Non-Medical): No  Physical Activity: Inactive (07/02/2023)   Exercise Vital Sign    Days of Exercise per Week: 0 days    Minutes of Exercise per Session: 0 min  Stress: Stress Concern Present (07/02/2023)   [employer] of Occupational Health - Occupational Stress Questionnaire    Feeling of Stress : Rather much  Social Connections: Moderately Integrated (07/02/2023)   Social Connection and Isolation Panel [NHANES]    Frequency of Communication with Friends and Family: More than three times a week    Frequency of Social Gatherings with Friends and Family: Once a week    Attends Religious Services: Never    Database administrator or Organizations: Yes    Attends Engineer, structural: More than 4 times per year     Marital Status: Married   Past Surgical History:  Procedure Laterality Date   ABDOMINAL HYSTERECTOMY     age was in her 30s   COLONOSCOPY  10/05/2017   Mild sigmoid diverticulosis. Otherwise normal colonoscopy.    cyst removal arm Left 1981   from cat scratch fever   EYE SURGERY     bilateral cataract removal   FOOT ARTHRODESIS Left 11/01/2021   Procedure: LEFT TALONAVICULAR AND SUBTALAR FUSION;  Surgeon: Nadara Mustard, MD;  Location: [facility] OR;  Service: Orthopedics;  Laterality: Left;   Past Surgical History:  Procedure Laterality Date   ABDOMINAL HYSTERECTOMY  age was in her 30s   COLONOSCOPY  10/05/2017   Mild sigmoid diverticulosis. Otherwise normal colonoscopy.    cyst removal arm Left 1981   from cat scratch fever   EYE SURGERY     bilateral cataract removal   FOOT ARTHRODESIS Left 11/01/2021   Procedure: LEFT TALONAVICULAR AND SUBTALAR FUSION;  Surgeon: Nadara Mustard, MD;  Location: [facility] OR;  Service: Orthopedics;  Laterality: Left;   Past Medical History:  Diagnosis Date   Allergic rhinitis    Anxiety    Arthritis    Family history of colonic polyps    Fibromyalgia    Heart murmur    History of colonic polyps    Hypercholesterolemia    Hypertension    Mitral valve prolapse    Ocular migraine    Palpitations    BP 96/63   Pulse 62   Ht 5\' 1"  (1.549 m)   Wt 185 lb (83.9 kg)   SpO2 98%   BMI 34.96 kg/m   Opioid Risk Score:   Fall Risk Score:  `1  Depression screen [facility] 2/9     07/03/2023   11:19 AM 04/27/2023    9:31 AM 03/20/2023    9:17 AM 03/10/2023   10:17 AM 03/06/2023    8:28 AM 12/12/2022   10:23 AM 12/04/2022   11:39 AM  Depression screen PHQ 2/9  Decreased Interest 1 0 0 1 1 0 2  Down, Depressed, Hopeless 0 0 0 1 1 0 2  PHQ - 2 Score 1 0 0 2 2 0 4  Altered sleeping 3  0 3 3 0 2  Tired, decreased energy 1  0 3 3 0 3  Change in appetite 1  0 3 2 0 3  Feeling bad or failure about yourself  0  0 1 1 0 0  Trouble concentrating 1  0 1 1 0 0   Moving slowly or fidgety/restless 0  0 0 3 0 0  Suicidal thoughts 0   0 0 0 0  PHQ-9 Score 7  0 13 15 0 12  Difficult doing work/chores Somewhat difficult  Not difficult at all Somewhat difficult Somewhat difficult Not difficult at all Extremely dIfficult      Review of Systems  Musculoskeletal:  Positive for back pain.       Left shoulder pain  All other systems reviewed and are negative.     Objective:   Physical Exam   Gen: no distress, normal appearing HEENT: oral mucosa pink and moist, NCAT Chest: normal effort, normal rate of breathing Abd: soft, non-distended Ext: no edema Psych: pleasant, normal affect Skin: intact Neuro: Alert and awake, follows commands, cranial nerves II through XII grossly intact, normal speech and language Sensation light touch intact in all 4 extremities No focal motor deficits Musculoskeletal:  Facet loading negative-unchanged Slump test negative-resulted in low back pain Spurling's negative Diffuse tenderness in arms and legs-decreased    C spine Xray 04/29/22 FINDINGS: No acute fracture or subluxation identified. Minimal anterolisthesis of C3 on C4 noted. Mild to moderate intervertebral disc space narrowing from C4 through C7, with small dorsal endplate osteophytes. Evidence of mild facet hypertrophy. No prevertebral soft tissue swelling visualized.   IMPRESSION: Degenerative changes of the cervical spine.   Shoulder xary 04/17/22 IMPRESSION: 1. Mild to moderate glenohumeral and mild acromioclavicular osteoarthritis. 2. Small calcific density overlying the left axillary soft tissues. This is nonspecific. It could represent a partially calcified benign lymph node or sequela of  remote soft tissue trauma.      Assessment & Plan:      Polyarthralgia -Likely related to fibromyalgia -She also has a history of arthritic conditions such as left shoulder rotator cuff tear, posterior tibial tendon tendinitis, left talonavicular and  subtalar fusion. -TENS unit discussed, Zynax device ordered prior visit- she says it was too expensive to order -Milnacipran ordered prior visit - she did not try, says she used this in the past without benefit and it was too expensive -Aquatic therapy-completed several sessions.  She reports questionable benefit -Continue tumaric -Discussed trying low-dose naltrexone, she is not interested at this time -She is not interested in any medication changes at this time -Continue low impact progressive exercise     Chronic lower back and neck pain, cervical spondylosis noted on prior x-ray -Asked her to provide records from MRI L spine and injections  -She reports prior lumbar injections, sounds like this was facet joint radiofrequency ablation.  She reports pain relief lasted for only a day.  She denies any additional injections at this time  Follow-up 1 year or as needed if needed

## 2023-08-31 ENCOUNTER — Encounter: Payer: Self-pay | Admitting: Cardiology

## 2023-09-08 ENCOUNTER — Ambulatory Visit
Admission: RE | Admit: 2023-09-08 | Discharge: 2023-09-08 | Disposition: A | Discharge status: Home / Self Care | Payer: Medicare HMO | Source: Ambulatory Visit | Attending: Internal Medicine | Admitting: Internal Medicine

## 2023-09-08 DIAGNOSIS — Z1231 Encounter for screening mammogram for malignant neoplasm of breast: Secondary | ICD-10-CM | POA: Diagnosis not present

## 2023-09-08 DIAGNOSIS — Z0000 Encounter for general adult medical examination without abnormal findings: Secondary | ICD-10-CM

## 2023-09-13 ENCOUNTER — Encounter (INDEPENDENT_AMBULATORY_CARE_PROVIDER_SITE_OTHER): Payer: Self-pay | Admitting: Cardiology

## 2023-09-13 DIAGNOSIS — G4733 Obstructive sleep apnea (adult) (pediatric): Secondary | ICD-10-CM

## 2023-09-15 ENCOUNTER — Telehealth: Payer: Self-pay

## 2023-09-15 ENCOUNTER — Ambulatory Visit: Payer: Medicare HMO | Attending: Cardiology

## 2023-09-15 DIAGNOSIS — R0683 Snoring: Secondary | ICD-10-CM

## 2023-09-15 DIAGNOSIS — R400 Somnolence: Secondary | ICD-10-CM

## 2023-09-15 NOTE — Telephone Encounter (Signed)
-----   Message from Armanda Magic sent at 09/15/2023  6:03 AM EDT ----- Please let patient know that they have sleep apnea and recommend treating with CPAP.  Please order an auto CPAP from 4-15cm H2O with heated humidity and mask of choice.  Order overnight pulse ox on CPAP.  Followup with me in 6 weeks.

## 2023-09-15 NOTE — Telephone Encounter (Signed)
Left Vm, per DPR, with callback number for sleep study results and recommendations.

## 2023-09-15 NOTE — Procedures (Signed)
    SLEEP STUDY REPORT Patient Information Study Date: 09/13/2023 Patient Name: Leslie Griffin Patient ID: [id number] Birth Date: 04-Jan-1951 Age: 72 Gender: Female BMI: 34.5 (W=183 lb, H=5' 1'') Referring Physician: Epifanio Lesches, MD  TEST DESCRIPTION: Home sleep apnea testing was completed using the WatchPat, a Type 1 device, utilizing peripheral arterial tonometry (PAT), chest movement, actigraphy, pulse oximetry, pulse rate, body position and snore. AHI was calculated with apnea and hypopnea using valid sleep time as the denominator. RDI includes apneas, hypopneas, and RERAs. The data acquired and the scoring of sleep and all associated events were performed in accordance with the recommended standards and specifications as outlined in the AASM Manual for the Scoring of Sleep and Associated Events 2.2.0 (2015).  FINDINGS:  1. Mild Obstructive Sleep Apnea with AHI 6.3/hr.  2. No Central Sleep Apnea with pAHIc 0/hr.  3. Oxygen desaturations as low as 78%.  4. Mild to moderate snoring was present. O2 sats were < 88% for 15.3 min.  5. Total sleep time was 5 hrs and 21 min.  6. 14.2% of total sleep time was spent in REM sleep.  7. Shortened sleep onset latency at 6 min.  8. Shortened REM sleep onset latency at 61 min.  9. Total awakenings were 7. 10. Arrhythmia detection: None.  DIAGNOSIS: Mild Obstructive Sleep Apnea (G47.33)  RECOMMENDATIONS: 1. Clinical correlation of these findings is necessary. The decision to treat obstructive sleep apnea (OSA) is usually based on the presence of apnea symptoms or the presence of associated medical conditions such as Hypertension, Congestive Heart Failure, Atrial Fibrillation or Obesity. The most common symptoms of OSA are snoring, gasping for breath while sleeping, daytime sleepiness and fatigue.  2. Initiating apnea therapy is recommended given the presence of symptoms and/or associated conditions. Recommend proceeding with one of  the following:   a. Auto-CPAP therapy with a pressure range of 5-20cm H2O.   b. An oral appliance (OA) that can be obtained from certain dentists with expertise in sleep medicine. These are primarily of use in non-obese patients with mild and moderate disease.   c. An ENT consultation which may be useful to look for specific causes of obstruction and possible treatment options.   d. If patient is intolerant to PAP therapy, consider referral to ENT for evaluation for hypoglossal nerve stimulator.  3. Close follow-up is necessary to ensure success with CPAP or oral appliance therapy for maximum benefit .  4. A follow-up oximetry study on CPAP is recommended to assess the adequacy of therapy and determine the need for supplemental oxygen or the potential need for Bi-level therapy. An arterial blood gas to determine the adequacy of baseline ventilation and oxygenation should also be considered.  5. Healthy sleep recommendations include: adequate nightly sleep (normal 7-9 hrs/night), avoidance of caffeine after noon and alcohol near bedtime, and maintaining a sleep environment that is cool, dark and quiet.  6. Weight loss for overweight patients is recommended. Even modest amounts of weight loss can significantly improve the severity of sleep apnea.  7. Snoring recommendations include: weight loss where appropriate, side sleeping, and avoidance of alcohol before bed.  8. Operation of motor vehicle should be avoided when sleepy.  Signature: Armanda Magic, MD; [facility]; Diplomat, American Board of Sleep Medicine Electronically Signed: 09/15/2023 5:55:40 AM

## 2023-09-25 ENCOUNTER — Ambulatory Visit (INDEPENDENT_AMBULATORY_CARE_PROVIDER_SITE_OTHER): Payer: Medicare HMO | Admitting: Nurse Practitioner

## 2023-09-25 ENCOUNTER — Telehealth: Payer: Self-pay | Admitting: Cardiology

## 2023-09-25 VITALS — BP 104/62 | HR 57 | Temp 98.2°F | Ht 61.0 in | Wt 181.5 lb

## 2023-09-25 DIAGNOSIS — G4700 Insomnia, unspecified: Secondary | ICD-10-CM | POA: Diagnosis not present

## 2023-09-25 DIAGNOSIS — M797 Fibromyalgia: Secondary | ICD-10-CM

## 2023-09-25 DIAGNOSIS — Z23 Encounter for immunization: Secondary | ICD-10-CM

## 2023-09-25 MED ORDER — TRAZODONE HCL 100 MG PO TABS: 150.0000 mg | ORAL_TABLET | Freq: Every day | ORAL | 0 refills | Status: DC

## 2023-09-25 NOTE — Progress Notes (Signed)
Patient came in again this afernoon to accompany her husband to his visit. She is requesting her flu shot. High-dose flu shot administered, VIS provided.

## 2023-09-25 NOTE — Addendum Note (Signed)
Addended by: Jiles Prows E on: 09/25/2023 03:55 PM   Modules accepted: Orders

## 2023-09-25 NOTE — Assessment & Plan Note (Signed)
Chronic Not well-controlled Encouraged patient to call cardiology office to discuss next steps as far as treatment of sleep apnea In the meantime will increase trazodone to 150 mg up to 200 mg at bedtime as needed to help her insomnia and hopefully this may help ease her anxiety and depression.

## 2023-09-25 NOTE — Telephone Encounter (Signed)
Patient called to follow-up on her sleep study  results and next steps.

## 2023-09-25 NOTE — Patient Instructions (Signed)
Cardiology office: [phone]

## 2023-09-25 NOTE — Assessment & Plan Note (Signed)
Chronic Continue to follow-up with physical medicine rehab

## 2023-09-25 NOTE — Progress Notes (Signed)
   Established Patient Office Visit  Subjective   Patient ID: Leslie Griffin, female    DOB: [birth year]  Age: 72 y.o. MRN: [id number]  Chief Complaint  Patient presents with   Insomnia    Insomnia/Stress: Her son has moved back in with her and her husband temporarily. He has schizophrenia and physical limitations due to chronic health disorders. This has resulted in increased stress.  She continues to have a hard time falling asleep and sometimes staying asleep.  Currently taking trazodone 100 mg by mouth at night as needed.  Also recently diagnosed with CPAP, but reports she has not yet made contact with the cardiology office had ordered the sleep study so she has not started CPAP as of yet.  Denies suicidal ideation.  Fibromyalgia: Continues to have significant pain especially on days where she is more active.      ROS: see HPI    Objective:     BP 104/62   Pulse (!) 57   Temp 98.2 F (36.8 C) (Temporal)   Ht 5\' 1"  (1.549 m)   Wt 181 lb 8 oz (82.3 kg)   SpO2 98%   BMI 34.29 kg/m  BP Readings from Last 3 Encounters:  09/25/23 104/62  08/17/23 96/63  07/29/23 123/72   Wt Readings from Last 3 Encounters:  09/25/23 181 lb 8 oz (82.3 kg)  08/17/23 185 lb (83.9 kg)  07/29/23 183 lb (83 kg)      Physical Exam Vitals reviewed.  Constitutional:      General: She is not in acute distress.    Appearance: Normal appearance.  HENT:     Head: Normocephalic and atraumatic.  Neck:     Vascular: No carotid bruit.  Cardiovascular:     Rate and Rhythm: Normal rate and regular rhythm.     Pulses: Normal pulses.     Heart sounds: Normal heart sounds.  Pulmonary:     Effort: Pulmonary effort is normal.     Breath sounds: Normal breath sounds.  Skin:    General: Skin is warm and dry.  Neurological:     General: No focal deficit present.     Mental Status: She is alert and oriented to person, place, and time.  Psychiatric:        Mood and Affect: Mood normal.         Behavior: Behavior normal.        Judgment: Judgment normal.      No results found for any visits on 09/25/23.    The 10-year ASCVD risk score (Arnett DK, et al., 2019) is: 8.6%    Assessment & Plan:   Problem List Items Addressed This Visit       Other   Insomnia    Chronic Not well-controlled Encouraged patient to call cardiology office to discuss next steps as far as treatment of sleep apnea In the meantime will increase trazodone to 150 mg up to 200 mg at bedtime as needed to help her insomnia and hopefully this may help ease her anxiety and depression.      Relevant Medications   traZODone (DESYREL) 100 MG tablet   Fibromyalgia - Primary    Chronic Continue to follow-up with physical medicine rehab      Relevant Medications   traZODone (DESYREL) 100 MG tablet    Return in about 8 weeks (around 11/20/2023) for F/U with Maralyn Sago.    Elenore Paddy, NP

## 2023-10-02 ENCOUNTER — Encounter: Payer: Self-pay | Admitting: Nurse Practitioner

## 2023-10-02 NOTE — Telephone Encounter (Signed)
  Per MyChart scheduling message:   I thought I would have heard about the sleep study before now. Can you tell me why is it taking so long?

## 2023-10-21 NOTE — Telephone Encounter (Signed)
The patient has been notified of the result. Left detailed message on voicemail and informed patient to call back..Syniyah Bourne Green, CMA   

## 2023-10-21 NOTE — Telephone Encounter (Signed)
RETURN CALL: The patient has been notified of the result and verbalized understanding.  All questions (if any) were answered. Latrelle Dodrill, CMA 10/21/2023 4:32 PM    Patient says she may have a growth in her throat. She will have it looked into before deciding on getting the cpap. She will get back to [place] at a later date and time.

## 2023-11-02 ENCOUNTER — Other Ambulatory Visit: Payer: Self-pay | Admitting: Nurse Practitioner

## 2023-11-02 DIAGNOSIS — G4700 Insomnia, unspecified: Secondary | ICD-10-CM

## 2023-11-02 DIAGNOSIS — I119 Hypertensive heart disease without heart failure: Secondary | ICD-10-CM

## 2023-11-02 DIAGNOSIS — E785 Hyperlipidemia, unspecified: Secondary | ICD-10-CM

## 2023-11-02 MED ORDER — AMLODIPINE BESYLATE 5 MG PO TABS: 5.0000 mg | ORAL_TABLET | Freq: Every day | ORAL | Status: DC

## 2023-11-02 MED ORDER — TRAZODONE HCL 100 MG PO TABS
150.0000 mg | ORAL_TABLET | Freq: Every day | ORAL | 0 refills | Status: DC
Start: 2023-11-02 — End: 2024-02-24

## 2023-11-20 ENCOUNTER — Ambulatory Visit (INDEPENDENT_AMBULATORY_CARE_PROVIDER_SITE_OTHER): Payer: Medicare HMO | Admitting: Nurse Practitioner

## 2023-11-20 VITALS — BP 124/86 | HR 77 | Temp 98.4°F | Ht 61.0 in | Wt 176.2 lb

## 2023-11-20 DIAGNOSIS — R634 Abnormal weight loss: Secondary | ICD-10-CM | POA: Insufficient documentation

## 2023-11-20 DIAGNOSIS — G4700 Insomnia, unspecified: Secondary | ICD-10-CM | POA: Diagnosis not present

## 2023-11-20 DIAGNOSIS — J0100 Acute maxillary sinusitis, unspecified: Secondary | ICD-10-CM | POA: Diagnosis not present

## 2023-11-20 MED ORDER — AZITHROMYCIN 250 MG PO TABS: ORAL_TABLET | ORAL | 0 refills | Status: AC

## 2023-11-20 NOTE — Assessment & Plan Note (Signed)
 Chronic, improved Continue trazodone 150-200 mg by mouth at bedtime as needed.

## 2023-11-20 NOTE — Assessment & Plan Note (Signed)
 Acute Concern for secondary bacterial infection at this point Treat with Z-Pak Follow-up if symptoms persist or worsen.

## 2023-11-20 NOTE — Assessment & Plan Note (Signed)
 Unintentional, reports loss of appetite. Not significantly worsened mood reported.  Additionally, patient is currently sick and reports this is affected her appetite.  For now she will focus on eating 3 meals a day, plus a daily Ensure meal supplement.  She will follow-up in 6 weeks for close monitoring.

## 2023-11-20 NOTE — Progress Notes (Signed)
 Established Patient Office Visit  Subjective   Patient ID: Leslie Griffin, female    DOB: [birth year]  Age: 73 y.o. MRN: [id number]  Chief Complaint  Patient presents with   Medical Management of Chronic Issues    Follow up, congestion, weakness    Congestion: started 11 days ago. She was traveling in [place] on cruise ship when symptoms started.  Using over the counter severe flu and cold medicine or Alka-Seltzer flu and cold and Vicks VapoRub as needed.  Currently experiencing congestion, cough, wheezing.  Insomnia: Increase trazodone  to 150-200 mg by mouth at night as needed.  Reports this has improved her ability to sleep.  Like to stay on this dose for now.  Unintentional Weight Loss: Per chart review has lost about 16 pounds over the last 7 months.  This is about 8% weight loss.  Reports she just does not have much of an appetite.  She has had CRP and sed rate checked in July of this last year both of which were normal.  She is also undergone CT of chest, abdomen, and pelvis in August of this last year which did not identify any significant lymphadenopathy or masses.  Reports she had a similar episode of weight loss in her 30s when she just lost her appetite and lost about 50 pounds in 6 months.  Since increasing trazodone  feels that her anxiety has improved.     Review of Systems  Constitutional:  Positive for chills.  HENT:  Positive for congestion.   Respiratory:  Positive for cough, shortness of breath and wheezing. Negative for sputum production.   Cardiovascular:  Negative for chest pain (tightness) and palpitations.  Musculoskeletal:  Negative for myalgias (had aches in the begninng but have now resolved).  Neurological:  Positive for headaches.  Psychiatric/Behavioral:  The patient does not have insomnia.       Objective:     BP 124/86   Pulse 77   Temp 98.4 F (36.9 C) (Temporal)   Ht 5' 1 (1.549 m)   Wt 176 lb 4 oz (79.9 kg)   SpO2 95%   BMI 33.30 kg/m  BP  Readings from Last 3 Encounters:  11/20/23 124/86  09/25/23 104/62  08/17/23 96/63   Wt Readings from Last 3 Encounters:  11/20/23 176 lb 4 oz (79.9 kg)  09/25/23 181 lb 8 oz (82.3 kg)  08/17/23 185 lb (83.9 kg)      Physical Exam Vitals reviewed.  Constitutional:      General: She is not in acute distress.    Appearance: Normal appearance.  HENT:     Head: Normocephalic and atraumatic.     Right Ear: Hearing, tympanic membrane, ear canal and external ear normal.     Left Ear: Hearing, tympanic membrane, ear canal and external ear normal.  Cardiovascular:     Rate and Rhythm: Normal rate and regular rhythm.     Pulses: Normal pulses.     Heart sounds: Normal heart sounds.  Pulmonary:     Effort: Pulmonary effort is normal.     Breath sounds: Normal breath sounds.  Skin:    General: Skin is warm and dry.  Neurological:     General: No focal deficit present.     Mental Status: She is alert and oriented to person, place, and time.  Psychiatric:        Mood and Affect: Mood normal.        Behavior: Behavior normal.  Judgment: Judgment normal.      No results found for any visits on 11/20/23.    The 10-year ASCVD risk score (Arnett DK, et al., 2019) is: 12%    Assessment & Plan:   Problem List Items Addressed This Visit       Respiratory   Acute maxillary sinusitis - Primary   Acute Concern for secondary bacterial infection at this point Treat with Z-Pak Follow-up if symptoms persist or worsen.      Relevant Medications   azithromycin  (ZITHROMAX ) 250 MG tablet     Other   Insomnia   Chronic, improved Continue trazodone  150-200 mg by mouth at bedtime as needed.      Weight loss   Unintentional, reports loss of appetite. Not significantly worsened mood reported.  Additionally, patient is currently sick and reports this is affected her appetite.  For now she will focus on eating 3 meals a day, plus a daily Ensure meal supplement.  She will  follow-up in 6 weeks for close monitoring.       Return in about 6 weeks (around 01/01/2024) for F/U with Lauraine - needs 40 minutes.    Lauraine FORBES Pereyra, NP

## 2023-12-08 DIAGNOSIS — I129 Hypertensive chronic kidney disease with stage 1 through stage 4 chronic kidney disease, or unspecified chronic kidney disease: Secondary | ICD-10-CM | POA: Diagnosis not present

## 2023-12-08 DIAGNOSIS — G4700 Insomnia, unspecified: Secondary | ICD-10-CM | POA: Diagnosis not present

## 2023-12-08 DIAGNOSIS — I700 Atherosclerosis of aorta: Secondary | ICD-10-CM | POA: Diagnosis not present

## 2023-12-08 DIAGNOSIS — Z8249 Family history of ischemic heart disease and other diseases of the circulatory system: Secondary | ICD-10-CM | POA: Diagnosis not present

## 2023-12-08 DIAGNOSIS — G4733 Obstructive sleep apnea (adult) (pediatric): Secondary | ICD-10-CM | POA: Diagnosis not present

## 2023-12-08 DIAGNOSIS — M1990 Unspecified osteoarthritis, unspecified site: Secondary | ICD-10-CM | POA: Diagnosis not present

## 2023-12-08 DIAGNOSIS — F419 Anxiety disorder, unspecified: Secondary | ICD-10-CM | POA: Diagnosis not present

## 2023-12-08 DIAGNOSIS — K219 Gastro-esophageal reflux disease without esophagitis: Secondary | ICD-10-CM | POA: Diagnosis not present

## 2023-12-08 DIAGNOSIS — I422 Other hypertrophic cardiomyopathy: Secondary | ICD-10-CM | POA: Diagnosis not present

## 2023-12-08 DIAGNOSIS — F325 Major depressive disorder, single episode, in full remission: Secondary | ICD-10-CM | POA: Diagnosis not present

## 2023-12-08 DIAGNOSIS — E669 Obesity, unspecified: Secondary | ICD-10-CM | POA: Diagnosis not present

## 2023-12-08 DIAGNOSIS — Z008 Encounter for other general examination: Secondary | ICD-10-CM | POA: Diagnosis not present

## 2023-12-08 DIAGNOSIS — E785 Hyperlipidemia, unspecified: Secondary | ICD-10-CM | POA: Diagnosis not present

## 2024-01-01 ENCOUNTER — Ambulatory Visit: Payer: Medicare HMO | Admitting: Nurse Practitioner

## 2024-01-01 VITALS — BP 128/78 | HR 57 | Temp 98.2°F | Ht 61.0 in | Wt 180.0 lb

## 2024-01-01 DIAGNOSIS — R634 Abnormal weight loss: Secondary | ICD-10-CM

## 2024-01-01 DIAGNOSIS — R09A2 Foreign body sensation, throat: Secondary | ICD-10-CM | POA: Diagnosis not present

## 2024-01-01 NOTE — Progress Notes (Signed)
   Established Patient Office Visit  Subjective   Patient ID: Leslie Griffin, female    DOB: 12/31/1950  Age: 73 y.o. MRN: [id number]  Chief Complaint  Patient presents with   Follow-up    6 wk f/u. Patient c/o of a cough that happens mainly at night when she is laying. Says it feels like something is stuck in her throat and can't get it out     Unintentional weight loss: has been focusing on PO intake throughout the day. Has gained about 4 pounds since last office visit.   Reports a sensation of something stuck in her throat. Bothers her mostly at night time. No pain, no indigestion, on omeprazole. As stated above weight has increased with increased PO intake.     ROS: see HPI    Objective:     BP 128/78 (BP Location: Left Arm, Patient Position: Sitting, Cuff Size: Normal)   Pulse (!) 57   Temp 98.2 F (36.8 C) (Oral)   Ht 5\' 1"  (1.549 m)   Wt 180 lb (81.6 kg)   SpO2 95%   BMI 34.01 kg/m  BP Readings from Last 3 Encounters:  01/01/24 128/78  11/20/23 124/86  09/25/23 104/62   Wt Readings from Last 3 Encounters:  01/01/24 180 lb (81.6 kg)  11/20/23 176 lb 4 oz (79.9 kg)  09/25/23 181 lb 8 oz (82.3 kg)      Physical Exam Vitals reviewed.  Constitutional:      General: She is not in acute distress.    Appearance: Normal appearance.  HENT:     Head: Normocephalic and atraumatic.     Mouth/Throat:     Mouth: Mucous membranes are moist.     Tongue: No lesions.     Palate: No mass.     Pharynx: Oropharynx is clear.  Neck:     Vascular: No carotid bruit.  Cardiovascular:     Rate and Rhythm: Normal rate and regular rhythm.     Pulses: Normal pulses.     Heart sounds: Normal heart sounds.  Pulmonary:     Effort: Pulmonary effort is normal.     Breath sounds: Normal breath sounds.  Skin:    General: Skin is warm and dry.  Neurological:     General: No focal deficit present.     Mental Status: She is alert and oriented to person, place, and time.  Psychiatric:         Mood and Affect: Mood normal.        Behavior: Behavior normal.        Judgment: Judgment normal.      No results found for any visits on 01/01/24.    The 10-year ASCVD risk score (Arnett DK, et al., 2019) is: 14.5%    Assessment & Plan:   Problem List Items Addressed This Visit       Other   Weight loss   Weight gain of 4# since last office visit. Patient encouraged to continue eating frequent small meals throughout the day to ensure she is getting adequate caloric intake.       Globus sensation - Primary   Etiology unclear Referral to GI made today      Relevant Orders   Ambulatory referral to Gastroenterology    Return in about 3 months (around 03/30/2024) for F/U with Jarrad Mclees.    Elenore Paddy, NP

## 2024-01-01 NOTE — Assessment & Plan Note (Signed)
Etiology unclear Referral to GI made today

## 2024-01-01 NOTE — Assessment & Plan Note (Signed)
Weight gain of 4# since last office visit. Patient encouraged to continue eating frequent small meals throughout the day to ensure she is getting adequate caloric intake.

## 2024-01-29 ENCOUNTER — Ambulatory Visit
Admission: RE | Admit: 2024-01-29 | Discharge: 2024-01-29 | Disposition: A | Discharge status: Home / Self Care | Source: Ambulatory Visit | Attending: Emergency Medicine | Admitting: Emergency Medicine

## 2024-01-29 ENCOUNTER — Ambulatory Visit: Admitting: Radiology

## 2024-01-29 VITALS — BP 109/65 | HR 62 | Temp 98.7°F | Resp 17

## 2024-01-29 DIAGNOSIS — I700 Atherosclerosis of aorta: Secondary | ICD-10-CM | POA: Diagnosis not present

## 2024-01-29 DIAGNOSIS — R42 Dizziness and giddiness: Secondary | ICD-10-CM

## 2024-01-29 DIAGNOSIS — R0602 Shortness of breath: Secondary | ICD-10-CM

## 2024-01-29 DIAGNOSIS — R059 Cough, unspecified: Secondary | ICD-10-CM | POA: Diagnosis not present

## 2024-01-29 DIAGNOSIS — B349 Viral infection, unspecified: Secondary | ICD-10-CM | POA: Diagnosis not present

## 2024-01-29 DIAGNOSIS — I959 Hypotension, unspecified: Secondary | ICD-10-CM | POA: Diagnosis not present

## 2024-01-29 LAB — POCT INFLUENZA A/B
Influenza A, POC: NEGATIVE
Influenza B, POC: NEGATIVE

## 2024-01-29 NOTE — Discharge Instructions (Signed)
 Your chest x-ray did not show any obvious pneumonia.  Your flu testing was negative.  I believe you have a different viral illness causing your symptoms.  You can take 500 mg of Tylenol every 6-8 hours to help with any body aches.  1200 mg of Mucinex daily can help loosen up secretions in addition to at least 64 ounces of water and sleeping with a humidifier.  Please check your blood pressure tomorrow prior to taking your blood pressure medication.  It if it is less than 100/70 then please withhold this medication and contact your primary care provider.  Ensure you are staying hydrated and eating small balanced meals throughout the day.  You can add a protein shake to add calories and protein.  Seek immediate care at the nearest emergency department for any worsening dizziness, chest pain, loss of consciousness, or any new concerning symptoms.

## 2024-01-29 NOTE — ED Triage Notes (Addendum)
 Pt c/o congestion, dry cough,body pain, head ache and watery eyes for 5 days.  She also began to feel dizzy and have chest pain today.   At home covid test was negative last night.

## 2024-01-29 NOTE — ED Provider Notes (Addendum)
 [facility]    CSN: [id number] Arrival date & time: 01/29/24  1644      History   Chief Complaint Chief Complaint  Patient presents with   Nasal Congestion    dry cough,body pain, head ache and watery eyes. - Entered by patient    HPI Leslie Griffin is a 73 y.o. female.   Patient presents to clinic over concerns of nasal congestion, rhinorrhea, dry cough, shortness of breath, body aches, central chest pain that is reproducible to palpation, headache, watery eyes and dizziness.  Though symptoms have been present since Monday, dizziness and central chest pain that is reproducible to palpation started today.  Diminished appetite.  Has had half a cup of coffee, 2 slices of butter toast and some goulash today.  Has not had a fever. Reports she does get dizzy when her blood pressure is low. Took a home Covid-19 test yesterday and it was negative.   Oxygenation 97% on recheck at rest on room air.  The history is provided by the patient and medical records.    Past Medical History:  Diagnosis Date   Allergic rhinitis    Anxiety    Arthritis    Family history of colonic polyps    Fibromyalgia    Heart murmur    History of colonic polyps    Hypercholesterolemia    Hypertension    Mitral valve prolapse    Ocular migraine    Palpitations     Patient Active Problem List   Diagnosis Date Noted   Globus sensation 01/01/2024   Acute maxillary sinusitis 11/20/2023   Weight loss 11/20/2023   Screening for cardiovascular condition 03/20/2023   Hyperhidrosis 03/06/2023   Fibromyalgia 10/28/2022   Osteoarthritis 10/28/2022   Constipation 09/04/2022   Need for vaccination 09/04/2022   Primary osteoarthritis of left shoulder 05/13/2022   Prediabetes 05/01/2022   Murmur 04/17/2022   Posterior tibial tendinitis, left leg    HLD (hyperlipidemia) 08/22/2021   Visual snow syndrome 12/30/2020   Arthropathy of lumbar facet joint 11/12/2020   Chest pain 06/14/2020    Dissociative episodes 06/14/2020   Laceration of right index finger without foreign body without damage to nail 04/03/2020   Multiple drug allergies 04/03/2020   B12 deficiency 03/13/2020   Generalized abdominal pain 03/13/2020   Low serum iron 10/03/2019   Low vitamin D level 10/03/2019   Ocular migraine 10/03/2019   Familial hypercholesterolemia 06/27/2019   Upper GI bleed 06/27/2019   Right foot pain 03/27/2019   Generalized pain 10/08/2018   Hypertension 08/18/2018   Diastolic dysfunction, left ventricle 08/18/2018   Family history of macular degeneration 08/18/2018   GERD (gastroesophageal reflux disease) 08/18/2018   History of cataract surgery 08/18/2018   History of migraine 08/18/2018   Hypertensive cardiomyopathy, without heart failure (HCC) 08/18/2018   Medication refill 08/18/2018   Mixed dyslipidemia 08/18/2018   Obesity, Class II, BMI 35-39.9 08/18/2018   Wellness examination 08/18/2018   Cardiomyopathy (HCC) 02/03/2018   Pain and swelling of ankle, right 01/27/2018   Left-sided low back pain without sciatica 12/04/2017   Fatigue 12/04/2017   Anxiety 11/04/2017   Arthralgia 11/04/2017   History of attempted suicide 11/04/2017   Insomnia 11/04/2017   Moderate episode of recurrent major depressive disorder (HCC) 11/04/2017    Past Surgical History:  Procedure Laterality Date   ABDOMINAL HYSTERECTOMY     age was in her 30s   COLONOSCOPY  10/05/2017   Mild sigmoid diverticulosis. Otherwise normal colonoscopy.  cyst removal arm Left 1981   from cat scratch fever   EYE SURGERY     bilateral cataract removal   FOOT ARTHRODESIS Left 11/01/2021   Procedure: LEFT TALONAVICULAR AND SUBTALAR FUSION;  Surgeon: Nadara Mustard, MD;  Location: [facility] OR;  Service: Orthopedics;  Laterality: Left;    OB History   No obstetric history on file.      Home Medications    Prior to Admission medications   Medication Sig Start Date End Date Taking? Authorizing Provider   amLODipine (NORVASC) 5 MG tablet Take 1 tablet (5 mg total) by mouth daily. 11/02/23   Elenore Paddy, NP  carvedilol (COREG) 12.5 MG tablet Take 1 tablet (12.5 mg total) by mouth 2 (two) times daily. 07/03/23   Little Ishikawa, MD  cholecalciferol (VITAMIN D) 25 MCG (1000 UNIT) tablet Take 1,000 Units by mouth daily.    [provider]  cyanocobalamin 1000 MCG tablet Take 1 tablet by mouth daily.    [provider]  omeprazole (PRILOSEC) 20 MG capsule Take 1 capsule (20 mg total) by mouth daily. Please schedule appointment for additional refills. 03/20/23   Elenore Paddy, NP  rosuvastatin (CRESTOR) 10 MG tablet TAKE 1 TABLET BY MOUTH EVERY DAY 11/03/23   Elenore Paddy, NP  traZODone (DESYREL) 100 MG tablet Take 1.5-2 tablets (150-200 mg total) by mouth at bedtime. 11/02/23   Elenore Paddy, NP  TURMERIC PO Take by mouth.    [provider]    Family History Family History  Problem Relation Age of Onset   Coronary artery disease Other    Heart disease Mother    Heart disease Father    Colon polyps Sister    Other Sister        low hemoglobin   Colon cancer Neg Hx    Esophageal cancer Neg Hx    Rectal cancer Neg Hx    Stomach cancer Neg Hx     Social History Social History   Tobacco Use   Smoking status: Never   Smokeless tobacco: Never  Vaping Use   Vaping status: Never Used  Substance Use Topics   Alcohol use: Yes    Comment: ocassionally    Drug use: No     Allergies   Benadryl [diphenhydramine hcl], Cephalexin, Duloxetine, Penicillins, Topiramate, and Sulfa antibiotics   Review of Systems Review of Systems  Per HPI   Physical Exam Triage Vital Signs ED Triage Vitals  Encounter Vitals Group     BP 01/29/24 1712 (!) 91/50     Systolic BP Percentile --      Diastolic BP Percentile --      Pulse Rate 01/29/24 1712 62     Resp 01/29/24 1708 18     Temp 01/29/24 1712 98.7 F (37.1 C)     Temp Source 01/29/24 1708 Oral     SpO2  01/29/24 1712 93 %     Weight --      Height --      Head Circumference --      Peak Flow --      Pain Score 01/29/24 1711 7     Pain Loc --      Pain Education --      Exclude from Growth Chart --    No data found.  Updated Vital Signs BP 109/65 (BP Location: Right Arm)   Pulse 62   Temp 98.7 F (37.1 C) (Oral)   Resp 17  SpO2 97%   Visual Acuity Right Eye Distance:   Left Eye Distance:   Bilateral Distance:    Right Eye Near:   Left Eye Near:    Bilateral Near:     Physical Exam Vitals and nursing note reviewed.  Constitutional:      Appearance: Normal appearance.  HENT:     Head: Normocephalic and atraumatic.     Right Ear: External ear normal.     Left Ear: External ear normal.     Nose: Congestion and rhinorrhea present.     Mouth/Throat:     Mouth: Mucous membranes are moist.     Pharynx: Posterior oropharyngeal erythema present.  Eyes:     Conjunctiva/sclera: Conjunctivae normal.  Cardiovascular:     Rate and Rhythm: Normal rate and regular rhythm.     Heart sounds: Normal heart sounds. No murmur heard. Pulmonary:     Effort: Pulmonary effort is normal. No respiratory distress.     Breath sounds: Normal breath sounds. No wheezing.  Musculoskeletal:        General: Normal range of motion.  Skin:    General: Skin is warm.  Neurological:     General: No focal deficit present.     Mental Status: She is alert.  Psychiatric:        Mood and Affect: Mood normal.      UC Treatments / Results  Labs (all labs ordered are listed, but only abnormal results are displayed) Labs Reviewed  POCT INFLUENZA A/B    EKG   Radiology DG Chest 2 View Result Date: 01/29/2024 CLINICAL DATA:  cough, SOB EXAM: CHEST - 2 VIEW COMPARISON:  Chest x-ray 03/06/2023 FINDINGS: The heart and mediastinal contours are unchanged. Atherosclerotic plaque. No focal consolidation. No pulmonary edema. No pleural effusion. No pneumothorax. No acute osseous abnormality.  Redemonstration of calcification overlying the left axilla. IMPRESSION: 1. No active cardiopulmonary disease. 2. Aortic Atherosclerosis (ICD10-I70.0). Electronically Signed   By: Tish Frederickson M.D.   On: 01/29/2024 19:18    Procedures Procedures (including critical care time)  Medications Ordered in UC Medications - No data to display  Initial Impression / Assessment and Plan / UC Course  I have reviewed the triage vital signs and the nursing notes.  Pertinent labs & imaging results that were available during my care of the patient were reviewed by me and considered in my medical decision making (see chart for details).  Vitals and triage reviewed, patient is congested with rhinorrhea and postnasal drip.  Lungs are vesicular, heart with regular rate and rhythm.  EKG obtained due to chest pain, shows ventricular rate of 60 bpm, without any ST elevation or ST depression.  Chest pain is central and reproducible to palpation, reassuring for musculoskeletal etiology.  Endorsing shortness of breath,  CXR by my interpretation does not show any obvious changes from the 1 done last year in April.  No obvious pneumonia.  Influenza testing negative.  Negative home COVID test.  Suspect symptoms are related to viral illness, may have caused fibromyalgia flare.  Blood pressure improved on recheck.  Tolerating oral fluids.  Oxygenation 97% on room air.  Moist mucous membranes.  She is alert and oriented, ambulatory.  Overall feel as if patient is stable for discharge.  Strict emergency precautions given if symptoms evolve or worsen.  Patient verbalized understanding, no questions at this time.  Official radiology overread does not show any acute cardiopulmonary disease processes, no change in treatment plan at this time.  Final Clinical Impressions(s) / UC Diagnoses   Final diagnoses:  Shortness of breath  Dizziness  Hypotension, unspecified hypotension type  Viral illness     Discharge  Instructions      Your chest x-ray did not show any obvious pneumonia.  Your flu testing was negative.  I believe you have a different viral illness causing your symptoms.  You can take 500 mg of Tylenol every 6-8 hours to help with any body aches.  1200 mg of Mucinex daily can help loosen up secretions in addition to at least 64 ounces of water and sleeping with a humidifier.  Please check your blood pressure tomorrow prior to taking your blood pressure medication.  It if it is less than 100/70 then please withhold this medication and contact your primary care provider.  Ensure you are staying hydrated and eating small balanced meals throughout the day.  You can add a protein shake to add calories and protein.  Seek immediate care at the nearest emergency department for any worsening dizziness, chest pain, loss of consciousness, or any new concerning symptoms.      ED Prescriptions   None    PDMP not reviewed this encounter.   Daylah Sayavong, [place] N, FNP 01/29/24 1814    Lindia Garms, [place], [place] 01/29/24 [phone]

## 2024-02-15 ENCOUNTER — Encounter: Payer: Self-pay | Admitting: Nurse Practitioner

## 2024-02-23 ENCOUNTER — Other Ambulatory Visit: Payer: Self-pay | Admitting: Nurse Practitioner

## 2024-02-23 DIAGNOSIS — G4700 Insomnia, unspecified: Secondary | ICD-10-CM

## 2024-03-06 ENCOUNTER — Encounter: Payer: Self-pay | Admitting: Nurse Practitioner

## 2024-03-25 ENCOUNTER — Encounter (HOSPITAL_COMMUNITY): Payer: Self-pay

## 2024-03-26 ENCOUNTER — Other Ambulatory Visit: Payer: Self-pay | Admitting: Medical Genetics

## 2024-03-29 ENCOUNTER — Other Ambulatory Visit (HOSPITAL_COMMUNITY)
Admission: RE | Admit: 2024-03-29 | Discharge: 2024-03-29 | Disposition: A | Discharge status: Home / Self Care | Payer: Self-pay | Source: Ambulatory Visit | Attending: Medical Genetics | Admitting: Medical Genetics

## 2024-04-01 ENCOUNTER — Ambulatory Visit: Payer: Self-pay | Admitting: Nurse Practitioner

## 2024-04-01 ENCOUNTER — Ambulatory Visit: Payer: Medicare HMO | Admitting: Nurse Practitioner

## 2024-04-01 VITALS — BP 122/80 | HR 54 | Temp 98.0°F | Ht 61.0 in | Wt 172.4 lb

## 2024-04-01 DIAGNOSIS — R634 Abnormal weight loss: Secondary | ICD-10-CM

## 2024-04-01 DIAGNOSIS — K219 Gastro-esophageal reflux disease without esophagitis: Secondary | ICD-10-CM

## 2024-04-01 DIAGNOSIS — I1A0 Resistant hypertension: Secondary | ICD-10-CM

## 2024-04-01 DIAGNOSIS — M797 Fibromyalgia: Secondary | ICD-10-CM

## 2024-04-01 LAB — COMPREHENSIVE METABOLIC PANEL WITH GFR
ALT: 17 U/L (ref 0–35)
AST: 20 U/L (ref 0–37)
Albumin: 4.4 g/dL (ref 3.5–5.2)
Alkaline Phosphatase: 66 U/L (ref 39–117)
BUN: 11 mg/dL (ref 6–23)
CO2: 30 meq/L (ref 19–32)
Calcium: 9.4 mg/dL (ref 8.4–10.5)
Chloride: 100 meq/L (ref 96–112)
Creatinine, Ser: 0.56 mg/dL (ref 0.40–1.20)
GFR: 91.22 mL/min (ref >60.00)
Glucose, Bld: 100 mg/dL — ABNORMAL HIGH (ref 70–99)
Potassium: 4.3 meq/L (ref 3.5–5.1)
Sodium: 138 meq/L (ref 135–145)
Total Bilirubin: 0.5 mg/dL (ref 0.2–1.2)
Total Protein: 7.5 g/dL (ref 6.0–8.3)

## 2024-04-01 MED ORDER — OMEPRAZOLE 20 MG PO CPDR: 20.0000 mg | DELAYED_RELEASE_CAPSULE | Freq: Every day | ORAL | 1 refills | Status: DC

## 2024-04-01 NOTE — Assessment & Plan Note (Signed)
 Unintentional, weight has been up and down over the last few months.  She is experiencing significant stress which is contributing to her appetite changes.  Patient encouraged to focus on calming, distressing techniques.  She was offered referral to counseling services but declined today.

## 2024-04-01 NOTE — Assessment & Plan Note (Signed)
 Chronic Check metabolic panel Continue carvedilol  12.5 mg twice daily Discontinue amlodipine 

## 2024-04-01 NOTE — Progress Notes (Signed)
 Established Patient Office Visit  Subjective   Patient ID: Leslie [facility], female    DOB: Oct 31, 1951  Age: 73 y.o. MRN: [id number]  Chief Complaint  Patient presents with   Cough    Cough: Patient arrives for follow-up. She reports she is experiencing a nocturnal cough.  She also feels abdominal bloating pain and is experiencing a lot of burping after meals.  No right upper quadrant pain.  Has been treated with omeprazole  in the past but cannot remember if this helped her symptoms before or not.  She does report sometimes she feels that food gets stuck in her esophagus and just sits in low esophagus at times.  She has appointment next week with GI for further evaluation.  Hypertension: Ran out of her amlodipine  and never refilled it.  Has been monitoring blood pressure at home and reports that blood pressure has been stable and within the normal range.  She would like to stop amlodipine  and continue on carvedilol .  Stress: She is experiencing a significant amount of stress at home due to interpersonal relationship conflicts.  This does affect her appetite, and she reports she tries eat at least twice a day but due to her abdominal bloating and burping does not eat large meals.  She has had a hard time maintaining her weight, but when she focuses on eating regularly and sometimes drinks supplements weight usually does improve.        Objective:     BP 122/80   Pulse (!) 54   Temp 98 F (36.7 C) (Temporal)   Ht 5\' 1"  (1.549 m)   Wt 172 lb 6 oz (78.2 kg)   SpO2 99%   BMI 32.57 kg/m  BP Readings from Last 3 Encounters:  04/01/24 122/80  01/29/24 109/65  01/01/24 128/78   Wt Readings from Last 3 Encounters:  04/01/24 172 lb 6 oz (78.2 kg)  01/01/24 180 lb (81.6 kg)  11/20/23 176 lb 4 oz (79.9 kg)      Physical Exam Vitals reviewed.  Constitutional:      General: She is not in acute distress.    Appearance: Normal appearance.  HENT:     Head: Normocephalic and atraumatic.   Neck:     Vascular: No carotid bruit.  Cardiovascular:     Rate and Rhythm: Normal rate and regular rhythm.     Pulses: Normal pulses.     Heart sounds: Normal heart sounds.  Pulmonary:     Effort: Pulmonary effort is normal.     Breath sounds: Normal breath sounds.  Skin:    General: Skin is warm and dry.  Neurological:     General: No focal deficit present.     Mental Status: She is alert and oriented to person, place, and time.  Psychiatric:        Mood and Affect: Mood normal.        Behavior: Behavior normal.        Judgment: Judgment normal.      No results found for any visits on 04/01/24.    The 10-year ASCVD risk score (Arnett DK, et al., 2019) is: 13.2%    Assessment & Plan:   Problem List Items Addressed This Visit       Cardiovascular and Mediastinum   Hypertension - Primary   Chronic Check metabolic panel Continue carvedilol  12.5 mg twice daily Discontinue amlodipine       Relevant Orders   Comprehensive metabolic panel with GFR     Digestive  GERD (gastroesophageal reflux disease)   I think her nocturnal cough, burping, bloating are all symptoms of GERD.  We will trial omeprazole  20 mg a mouth daily before food.  She also follow-up with GI scheduled next week to discuss further.      Relevant Medications   omeprazole  (PRILOSEC ) 20 MG capsule   Other Relevant Orders   Comprehensive metabolic panel with GFR     Other   Weight loss   Unintentional, weight has been up and down over the last few months.  She is experiencing significant stress which is contributing to her appetite changes.  Patient encouraged to focus on calming, distressing techniques.  She was offered referral to counseling services but declined today.       Return in about 4 months (around 08/02/2024) for F/U with Jule Whitsel.  Total time spent today was 35 minutes including face-to-face evaluation, extensive discussion regarding stress and concerns with GI symptoms, review of  medical records, and development/discussion of treatment plan.   Zorita Hiss, NP

## 2024-04-01 NOTE — Assessment & Plan Note (Signed)
 I think her nocturnal cough, burping, bloating are all symptoms of GERD.  We will trial omeprazole  20 mg a mouth daily before food.  She also follow-up with GI scheduled next week to discuss further.

## 2024-04-05 ENCOUNTER — Ambulatory Visit: Admitting: Gastroenterology

## 2024-04-05 ENCOUNTER — Encounter: Payer: Self-pay | Admitting: Gastroenterology

## 2024-04-05 VITALS — BP 98/70 | HR 66 | Ht 61.0 in | Wt 172.5 lb

## 2024-04-05 DIAGNOSIS — K219 Gastro-esophageal reflux disease without esophagitis: Secondary | ICD-10-CM

## 2024-04-05 DIAGNOSIS — R197 Diarrhea, unspecified: Secondary | ICD-10-CM

## 2024-04-05 DIAGNOSIS — R194 Change in bowel habit: Secondary | ICD-10-CM

## 2024-04-05 DIAGNOSIS — D509 Iron deficiency anemia, unspecified: Secondary | ICD-10-CM

## 2024-04-05 DIAGNOSIS — R09A2 Foreign body sensation, throat: Secondary | ICD-10-CM

## 2024-04-05 DIAGNOSIS — R9389 Abnormal findings on diagnostic imaging of other specified body structures: Secondary | ICD-10-CM

## 2024-04-05 DIAGNOSIS — Z860100 Personal history of colon polyps, unspecified: Secondary | ICD-10-CM

## 2024-04-05 DIAGNOSIS — R933 Abnormal findings on diagnostic imaging of other parts of digestive tract: Secondary | ICD-10-CM

## 2024-04-05 DIAGNOSIS — E611 Iron deficiency: Secondary | ICD-10-CM | POA: Diagnosis not present

## 2024-04-05 DIAGNOSIS — Z83719 Family history of colon polyps, unspecified: Secondary | ICD-10-CM

## 2024-04-05 MED ORDER — OMEPRAZOLE 20 MG PO CPDR: 20.0000 mg | DELAYED_RELEASE_CAPSULE | Freq: Two times a day (BID) | ORAL | 3 refills | Status: DC

## 2024-04-05 NOTE — Patient Instructions (Addendum)
 _______________________________________________________  If your blood pressure at your visit was 140/90 or greater, please contact your primary care physician to follow up on this.  _______________________________________________________  If you are age 72 or older, your body mass index should be between 23-30. Your Body mass index is 32.59 kg/m. If this is out of the aforementioned range listed, please consider follow up with your Primary Care Provider.  If you are age 80 or younger, your body mass index should be between 19-25. Your Body mass index is 32.59 kg/m. If this is out of the aformentioned range listed, please consider follow up with your Primary Care Provider.   ________________________________________________________  The [place] [facility] providers would like to encourage you to use MYCHART to communicate with providers for non-urgent requests or questions.  Due to long hold times on the telephone, sending your provider a message by [facility] may be a faster and more efficient way to get a response.  Please allow 48 business hours for a response.  Please remember that this is for non-urgent requests.  _______________________________________________________  Avoid NSAID's   We have sent the following medications to your pharmacy for you to pick up at your convenience: Omeprazole  20mg  2 times a day  You have been scheduled for a Barium Esophogram at [facility] (Entrance A) on 04-13-24 at 9am. Please arrive 30 minutes prior to your appointment for registration. Make certain not to have anything to eat or drink 3 hours prior to your test. If you need to reschedule for any reason, please contact radiology at [phone] to do so. __________________________________________________________________ A barium swallow is an examination that concentrates on views of the esophagus. This tends to be a double contrast exam (barium and two liquids which, when combined, create a gas to distend  the wall of the oesophagus) or single contrast (non-ionic iodine based). The study is usually tailored to your symptoms so a good history is essential. Attention is paid during the study to the form, structure and configuration of the esophagus, looking for functional disorders (such as aspiration, dysphagia, achalasia, motility and reflux) EXAMINATION You may be asked to change into a gown, depending on the type of swallow being performed. A radiologist and radiographer will perform the procedure. The radiologist will advise you of the type of contrast selected for your procedure and direct you during the exam. You will be asked to stand, sit or lie in several different positions and to hold a small amount of fluid in your mouth before being asked to swallow while the imaging is performed .In some instances you may be asked to swallow barium coated marshmallows to assess the motility of a solid food bolus. The exam can be recorded as a digital or video fluoroscopy procedure. POST PROCEDURE It will take 1-2 days for the barium to pass through your system. To facilitate this, it is important, unless otherwise directed, to increase your fluids for the next 24-48hrs and to resume your normal diet.  This test typically takes about 30 minutes to perform. __________________________________________________________________________________  [facility] have been scheduled for an endoscopy. Please follow written instructions given to you at your visit today.  If you use inhalers (even only as needed), please bring them with you on the day of your procedure.  If you take any of the following medications, they will need to be adjusted prior to your procedure:   DO NOT TAKE 7 DAYS PRIOR TO TEST- Trulicity (dulaglutide) Ozempic, Wegovy (semaglutide) Mounjaro (tirzepatide) Bydureon Bcise (exanatide extended release)  DO NOT TAKE 1 DAY PRIOR TO YOUR TEST Rybelsus (semaglutide) Adlyxin (lixisenatide) Victoza  (liraglutide) Byetta (exanatide) ___________________________________________________________________________

## 2024-04-05 NOTE — Progress Notes (Signed)
 Chief Complaint: Abdominal pain  Referring Provider: Dr. Lonie Roa.      ASSESSMENT AND PLAN;   #1. GERD with globus sensation/cough. Neg ENT eval in [place] 4-5 yrs ago. #2. Iron deficiency without anemia.   #3. Change in bowel habits in form of intermittent diarrhea.  She likely has underlying IBS with predominant diarrhea. Neg random colon biopsies for microscopic colitis #4. Abn CT per pt [facility] 08/14/2019) showing gastric wall thickening, colonic diverticulosis. #5. H/O colonic polyps. Rpt colon due 10/2026  Plan: - Ba Swallow to r/o Zenkers - EGD with +/- dil. - Resume omeprazole  20mg  po bid #60 - Avoid all nonsteroidals for now. - If above neg, recommend ENT re-eval    HPI:    Leslie Griffin is a 73 y.o. female  With GERD, MVP, HTN, HLD, fibromyalgia, anxiety, OSA with other comorbidities  Discussed the use of AI scribe software for clinical note transcription with the patient, who gave verbal consent to proceed.  History of Present Illness Leslie Griffin is a 73 year old female with gastroesophageal reflux disease who presents with a sensation of something in her throat and a chronic cough. She is accompanied by her son.  She experiences a sensation of something in her throat, described as feeling like a crumb stuck, which causes her to cough and sometimes makes swallowing difficult. This sensation has been present for four to five years and has worsened over time. The cough is more prominent at night, waking her up periodically. No nausea, vomiting, or heartburn, but her voice sometimes sounds gravelly. She reports decreased appetite, early satiety, and unintentional weight loss, eating only a small portion of a sandwich and skipping dinner due to lack of hunger.  She has a history of gastroesophageal reflux disease and recalls a previous evaluation by an ENT specialist who mentioned a type of asthma related to reflux. She recently restarted omeprazole  20 mg once daily but  had previously been on Protonix  twice daily. She stopped the medication at one point but does not recall the reason.  She has a history of mild to moderate sleep apnea but is not currently using CPAP therapy. She also reports balance issues and visual disturbances, including visual snow and occasional ocular migraines. Her past medical history includes mitral valve prolapse and a heart murmur, for which she takes Coreg  (carvedilol ). She previously had high blood pressure but is no longer on medication as her blood pressure has been normal.  She experiences seasonal allergies and asthma but does not take medication for these due to side effects. She reports a post-nasal drip and phlegm that sometimes feels stuck in her throat.   Per patient she has been found to be iron deficient without anemia and has been started on iron supplements.  Cannot find any records.   SH -Married, her husband who has retinitis pigmentosa.  Past GI procedures:  EGD 10/2019 - Mild gastritis. - Otherwise normal EGD. - neg however biopsies for celiac, negative HP  Colon 10/2019 - Diminutive colonic polyp s/ p polypectomy. - Moderate predominantly sigmoid diverticulosis. - Bx- SSA -Negative random colon biopsies for microscopic colitis. - Rpt 7 yrs   Colonoscopy 09/2017- (PCF)mild diverticulosis EGD ? [facility] 09/2018 - report NA. Past Medical History:  Diagnosis Date   Allergic rhinitis    Anxiety    Arthritis    Family history of colonic polyps    Fibromyalgia    Heart murmur    History of colonic polyps  Hypercholesterolemia    Hypertension    Mitral valve prolapse    Ocular migraine    Palpitations     Past Surgical History:  Procedure Laterality Date   ABDOMINAL HYSTERECTOMY     age was in her 30s   COLONOSCOPY  10/05/2017   Mild sigmoid diverticulosis. Otherwise normal colonoscopy.    cyst removal arm Left 1981   from cat scratch fever   EYE SURGERY     bilateral cataract removal   FOOT  ARTHRODESIS Left 11/01/2021   Procedure: LEFT TALONAVICULAR AND SUBTALAR FUSION;  Surgeon: Timothy Ford, MD;  Location: [facility] OR;  Service: Orthopedics;  Laterality: Left;    Family History  Problem Relation Age of Onset   Heart disease Mother    Heart disease Father    Colon polyps Sister    Other Sister        low hemoglobin   Coronary artery disease Other    Colon cancer Neg Hx    Esophageal cancer Neg Hx    Rectal cancer Neg Hx    Stomach cancer Neg Hx    Pancreatic cancer Neg Hx     Social History   Tobacco Use   Smoking status: Never   Smokeless tobacco: Never  Vaping Use   Vaping status: Never Used  Substance Use Topics   Alcohol  use: Yes    Comment: ocassionally    Drug use: No    Current Outpatient Medications  Medication Sig Dispense Refill   carvedilol  (COREG ) 12.5 MG tablet Take 1 tablet (12.5 mg total) by mouth 2 (two) times daily. 180 tablet 2   cyanocobalamin  1000 MCG tablet Take 1 tablet by mouth daily.     NON FORMULARY Take 1 tablet by mouth daily. Nicotinamide Adenine Dinucleotide     omeprazole  (PRILOSEC ) 20 MG capsule Take 1 capsule (20 mg total) by mouth daily. Please schedule appointment for additional refills. 90 capsule 1   rosuvastatin  (CRESTOR ) 10 MG tablet TAKE 1 TABLET BY MOUTH EVERY DAY 90 tablet 3   traZODone  (DESYREL ) 100 MG tablet TAKE 1.5-2 TABLETS (150-200 MG TOTAL) BY MOUTH AT BEDTIME. 180 tablet 1   No current facility-administered medications for this visit.    Allergies  Allergen Reactions   Benadryl [Diphenhydramine Hcl] Shortness Of Breath and Rash   Cephalexin Shortness Of Breath and Rash    *Keflex   Duloxetine Other (See Comments)    Violent tremors    Penicillins Shortness Of Breath and Rash    Childhood Reaction    Topiramate Shortness Of Breath   Sulfa Antibiotics Rash    Review of Systems:  neg     Physical Exam:    BP 98/70   Pulse 66   Ht 5\' 1"  (1.549 m)   Wt 172 lb 8 oz (78.2 kg)   SpO2 96%   BMI  32.59 kg/m  Filed Weights   04/05/24 1006  Weight: 172 lb 8 oz (78.2 kg)   Constitutional:  Well-developed, in no acute distress. Psychiatric: Normal mood and affect. Behavior is normal. HEENT: Pupils normal.  Conjunctivae are normal. No scleral icterus. Neck supple.  Cardiovascular: Normal rate, regular rhythm. No edema Pulmonary/chest: Effort normal and breath sounds normal. No wheezing, rales or rhonchi. Abdominal: Soft, nondistended.  Mild epigastric tenderness.  Bowel sounds active throughout. There are no masses palpable. No hepatomegaly. Rectal:  defered Neurological: Alert and oriented to person place and time. Skin: Skin is warm and dry. No rashes noted.  Latest Ref Rng & Units 06/10/2023   11:25 AM 03/06/2023    8:41 AM 12/12/2022   11:31 AM  CBC  WBC 4.0 - 10.5 K/uL 3.9  5.2  6.1   Hemoglobin 12.0 - 15.0 g/dL 16.1  09.6  04.5   Hematocrit 36.0 - 46.0 % 38.6  40.6  38.7   Platelets 150 - 400 K/uL 210  252.0  237.0       Latest Ref Rng & Units 04/01/2024   11:32 AM 06/10/2023   11:25 AM 03/06/2023    8:41 AM  CMP  Glucose 70 - 99 mg/dL 409  87  89   BUN 6 - 23 mg/dL 11  10  13    Creatinine 0.40 - 1.20 mg/dL 8.11  9.14  7.82   Sodium 135 - 145 mEq/L 138  139  139   Potassium 3.5 - 5.1 mEq/L 4.3  4.0  3.6   Chloride 96 - 112 mEq/L 100  103  102   CO2 19 - 32 mEq/L 30  29  30    Calcium  8.4 - 10.5 mg/dL 9.4  9.6  9.4   Total Protein 6.0 - 8.3 g/dL 7.5  7.2  7.7   Total Bilirubin 0.2 - 1.2 mg/dL 0.5  0.5  0.4   Alkaline Phos 39 - 117 U/L 66  77  89   AST 0 - 37 U/L 20  16  20    ALT 0 - 35 U/L 17  14  21       Magnus Schuller, MD 04/05/2024, 10:23 AM  Cc: Dr. Lonie Roa.

## 2024-04-08 LAB — GENECONNECT MOLECULAR SCREEN: Genetic Analysis Overall Interpretation: NEGATIVE

## 2024-04-13 ENCOUNTER — Ambulatory Visit (HOSPITAL_COMMUNITY)
Admission: RE | Admit: 2024-04-13 | Discharge: 2024-04-13 | Disposition: A | Discharge status: Home / Self Care | Source: Ambulatory Visit | Attending: Gastroenterology | Admitting: Gastroenterology

## 2024-04-13 DIAGNOSIS — K219 Gastro-esophageal reflux disease without esophagitis: Secondary | ICD-10-CM | POA: Insufficient documentation

## 2024-04-13 DIAGNOSIS — R09A2 Foreign body sensation, throat: Secondary | ICD-10-CM | POA: Insufficient documentation

## 2024-04-13 DIAGNOSIS — R1310 Dysphagia, unspecified: Secondary | ICD-10-CM | POA: Diagnosis not present

## 2024-04-19 ENCOUNTER — Encounter: Payer: Self-pay | Admitting: Nurse Practitioner

## 2024-04-19 DIAGNOSIS — M797 Fibromyalgia: Secondary | ICD-10-CM

## 2024-04-24 ENCOUNTER — Ambulatory Visit: Payer: Self-pay | Admitting: Gastroenterology

## 2024-04-25 NOTE — Telephone Encounter (Signed)
 Patient wants to know if her prominent cricopharyngeal muscle is the reason why she has the coughing and also she said she did research and wants to know if her fibromyalgia is contributing to her issues as well. She said she is in Stage 4 for fibromyalgia according to her research. Please advise

## 2024-05-19 ENCOUNTER — Other Ambulatory Visit: Payer: Self-pay | Admitting: Cardiology

## 2024-05-19 DIAGNOSIS — I119 Hypertensive heart disease without heart failure: Secondary | ICD-10-CM

## 2024-05-31 DIAGNOSIS — H43812 Vitreous degeneration, left eye: Secondary | ICD-10-CM | POA: Diagnosis not present

## 2024-05-31 DIAGNOSIS — Z961 Presence of intraocular lens: Secondary | ICD-10-CM | POA: Diagnosis not present

## 2024-05-31 DIAGNOSIS — H40023 Open angle with borderline findings, high risk, bilateral: Secondary | ICD-10-CM | POA: Diagnosis not present

## 2024-06-02 ENCOUNTER — Encounter: Payer: Self-pay | Admitting: Gastroenterology

## 2024-06-02 ENCOUNTER — Ambulatory Visit: Admitting: Gastroenterology

## 2024-06-02 VITALS — BP 131/81 | HR 53 | Temp 97.5°F | Resp 10 | Ht 61.0 in | Wt 172.0 lb

## 2024-06-02 DIAGNOSIS — F419 Anxiety disorder, unspecified: Secondary | ICD-10-CM | POA: Diagnosis not present

## 2024-06-02 DIAGNOSIS — G4733 Obstructive sleep apnea (adult) (pediatric): Secondary | ICD-10-CM | POA: Diagnosis not present

## 2024-06-02 DIAGNOSIS — K2289 Other specified disease of esophagus: Secondary | ICD-10-CM

## 2024-06-02 DIAGNOSIS — K219 Gastro-esophageal reflux disease without esophagitis: Secondary | ICD-10-CM | POA: Diagnosis not present

## 2024-06-02 DIAGNOSIS — D509 Iron deficiency anemia, unspecified: Secondary | ICD-10-CM | POA: Diagnosis not present

## 2024-06-02 DIAGNOSIS — R933 Abnormal findings on diagnostic imaging of other parts of digestive tract: Secondary | ICD-10-CM | POA: Diagnosis not present

## 2024-06-02 DIAGNOSIS — M797 Fibromyalgia: Secondary | ICD-10-CM | POA: Diagnosis not present

## 2024-06-02 MED ORDER — SODIUM CHLORIDE 0.9 % IV SOLN: 500.0000 mL | Freq: Once | INTRAVENOUS | Status: DC

## 2024-06-02 NOTE — Progress Notes (Signed)
 Sedate, gd SR, tolerated procedure well, VSS, report to RN

## 2024-06-02 NOTE — Progress Notes (Signed)
 Called to room to assist during endoscopic procedure.  Patient ID and intended procedure confirmed with present staff. Received instructions for my participation in the procedure from the performing physician.

## 2024-06-02 NOTE — Progress Notes (Signed)
 Chief Complaint: Abdominal pain  Referring Provider: Dr. Landry Georgi.      ASSESSMENT AND PLAN;   #1. GERD with globus sensation/cough. Neg ENT eval in [place] 4-5 yrs ago. #2. Iron deficiency without anemia.   #3. Change in bowel habits in form of intermittent diarrhea.  She likely has underlying IBS with predominant diarrhea. Neg random colon biopsies for microscopic colitis #4. Abn CT per pt [facility] 08/14/2019) showing gastric wall thickening, colonic diverticulosis. #5. H/O colonic polyps. Rpt colon due 10/2026  Plan: - Ba Swallow to r/o Zenkers - EGD with +/- dil. - Resume omeprazole  20mg  po bid #60 - Avoid all nonsteroidals for now. - If above neg, recommend ENT re-eval  Ba Swallow: Prominent cricopharyngeal bar. Otherwise no vestibular penetration or aspiration. Esophagus without mucosal lesions or strictures.  Normal motility. No hiatal hernia.  Trace spontaneous gastroesophageal reflux noted.  For EGD with dil  HPI:    Leslie Griffin is a 73 y.o. female  With GERD, MVP, HTN, HLD, fibromyalgia, anxiety, OSA with other comorbidities  Discussed the use of AI scribe software for clinical note transcription with the patient, who gave verbal consent to proceed.  History of Present Illness Leslie Griffin is a 73 year old female with gastroesophageal reflux disease who presents with a sensation of something in her throat and a chronic cough. She is accompanied by her son.  She experiences a sensation of something in her throat, described as feeling like a crumb stuck, which causes her to cough and sometimes makes swallowing difficult. This sensation has been present for four to five years and has worsened over time. The cough is more prominent at night, waking her up periodically. No nausea, vomiting, or heartburn, but her voice sometimes sounds gravelly. She reports decreased appetite, early satiety, and unintentional weight loss, eating only a small portion of a sandwich and  skipping dinner due to lack of hunger.  She has a history of gastroesophageal reflux disease and recalls a previous evaluation by an ENT specialist who mentioned a type of asthma related to reflux. She recently restarted omeprazole  20 mg once daily but had previously been on Protonix  twice daily. She stopped the medication at one point but does not recall the reason.  She has a history of mild to moderate sleep apnea but is not currently using CPAP therapy. She also reports balance issues and visual disturbances, including visual snow and occasional ocular migraines. Her past medical history includes mitral valve prolapse and a heart murmur, for which she takes Coreg  (carvedilol ). She previously had high blood pressure but is no longer on medication as her blood pressure has been normal.  She experiences seasonal allergies and asthma but does not take medication for these due to side effects. She reports a post-nasal drip and phlegm that sometimes feels stuck in her throat.   Per patient she has been found to be iron deficient without anemia and has been started on iron supplements.  Cannot find any records.   SH -Married, her husband who has retinitis pigmentosa.  Past GI procedures:  EGD 10/2019 - Mild gastritis. - Otherwise normal EGD. - neg however biopsies for celiac, negative HP  Colon 10/2019 - Diminutive colonic polyp s/ p polypectomy. - Moderate predominantly sigmoid diverticulosis. - Bx- SSA -Negative random colon biopsies for microscopic colitis. - Rpt 7 yrs   Colonoscopy 09/2017- (PCF)mild diverticulosis EGD ? [facility] 09/2018 - report NA. Past Medical History:  Diagnosis Date   Allergic rhinitis  Anxiety    Arthritis    Cataract    Depression    Family history of colonic polyps    Fibromyalgia    GERD (gastroesophageal reflux disease)    Heart murmur    History of colonic polyps    Hypercholesterolemia    Hypertension    Hypertrophic cardiomyopathy (HCC)     Mitral valve prolapse    Ocular migraine    Palpitations    Sleep apnea     Past Surgical History:  Procedure Laterality Date   ABDOMINAL HYSTERECTOMY     age was in her 30s   COLONOSCOPY  10/05/2017   Mild sigmoid diverticulosis. Otherwise normal colonoscopy.    cyst removal arm Left 1981   from cat scratch fever   EYE SURGERY     bilateral cataract removal   FOOT ARTHRODESIS Left 11/01/2021   Procedure: LEFT TALONAVICULAR AND SUBTALAR FUSION;  Surgeon: Harden Jerona GAILS, MD;  Location: [facility] OR;  Service: Orthopedics;  Laterality: Left;    Family History  Problem Relation Age of Onset   Heart disease Mother    Heart disease Father    Colon polyps Sister    Other Sister        low hemoglobin   Coronary artery disease Other    Colon cancer Neg Hx    Esophageal cancer Neg Hx    Rectal cancer Neg Hx    Stomach cancer Neg Hx    Pancreatic cancer Neg Hx     Social History   Tobacco Use   Smoking status: Never   Smokeless tobacco: Never  Vaping Use   Vaping status: Never Used  Substance Use Topics   Alcohol  use: Yes    Comment: ocassionally    Drug use: No    Current Outpatient Medications  Medication Sig Dispense Refill   carvedilol  (COREG ) 12.5 MG tablet TAKE 1 TABLET BY MOUTH 2 TIMES DAILY. 180 tablet 0   cyanocobalamin  1000 MCG tablet Take 1 tablet by mouth daily.     rosuvastatin  (CRESTOR ) 10 MG tablet TAKE 1 TABLET BY MOUTH EVERY DAY 90 tablet 3   traZODone  (DESYREL ) 100 MG tablet TAKE 1.5-2 TABLETS (150-200 MG TOTAL) BY MOUTH AT BEDTIME. 180 tablet 1   omeprazole  (PRILOSEC ) 20 MG capsule Take 1 capsule (20 mg total) by mouth daily. Please schedule appointment for additional refills. (Patient not taking: Reported on 06/02/2024) 90 capsule 1   omeprazole  (PRILOSEC ) 20 MG capsule Take 1 capsule (20 mg total) by mouth 2 (two) times daily before a meal. (Patient not taking: Reported on 06/02/2024) 180 capsule 3   Current Facility-Administered Medications  Medication  Dose Route Frequency Provider Last Rate Last Admin   0.9 %  sodium chloride  infusion  500 mL Intravenous Once Charlanne Groom, MD        Allergies  Allergen Reactions   Benadryl [Diphenhydramine Hcl] Shortness Of Breath and Rash   Cephalexin Shortness Of Breath and Rash    *Keflex   Duloxetine Other (See Comments)    Violent tremors    Penicillins Shortness Of Breath and Rash    Childhood Reaction    Topiramate Shortness Of Breath   Sulfa Antibiotics Rash    Review of Systems:  neg     Physical Exam:    BP (!) 155/88   Pulse (!) 57   Temp (!) 97.5 F (36.4 C)   Ht 5' 1 (1.549 m)   Wt 172 lb (78 kg)   SpO2 96%  BMI 32.50 kg/m  Filed Weights   06/02/24 0828  Weight: 172 lb (78 kg)   Constitutional:  Well-developed, in no acute distress. Psychiatric: Normal mood and affect. Behavior is normal. HEENT: Pupils normal.  Conjunctivae are normal. No scleral icterus. Neck supple.  Cardiovascular: Normal rate, regular rhythm. No edema Pulmonary/chest: Effort normal and breath sounds normal. No wheezing, rales or rhonchi. Abdominal: Soft, nondistended.  Mild epigastric tenderness.  Bowel sounds active throughout. There are no masses palpable. No hepatomegaly. Rectal:  defered Neurological: Alert and oriented to person place and time. Skin: Skin is warm and dry. No rashes noted.     Latest Ref Rng & Units 06/10/2023   11:25 AM 03/06/2023    8:41 AM 12/12/2022   11:31 AM  CBC  WBC 4.0 - 10.5 K/uL 3.9  5.2  6.1   Hemoglobin 12.0 - 15.0 g/dL 87.3  86.2  86.9   Hematocrit 36.0 - 46.0 % 38.6  40.6  38.7   Platelets 150 - 400 K/uL 210  252.0  237.0       Latest Ref Rng & Units 04/01/2024   11:32 AM 06/10/2023   11:25 AM 03/06/2023    8:41 AM  CMP  Glucose 70 - 99 mg/dL 899  87  89   BUN 6 - 23 mg/dL 11  10  13    Creatinine 0.40 - 1.20 mg/dL 9.43  9.41  9.44   Sodium 135 - 145 mEq/L 138  139  139   Potassium 3.5 - 5.1 mEq/L 4.3  4.0  3.6   Chloride 96 - 112 mEq/L 100  103   102   CO2 19 - 32 mEq/L 30  29  30    Calcium  8.4 - 10.5 mg/dL 9.4  9.6  9.4   Total Protein 6.0 - 8.3 g/dL 7.5  7.2  7.7   Total Bilirubin 0.2 - 1.2 mg/dL 0.5  0.5  0.4   Alkaline Phos 39 - 117 U/L 66  77  89   AST 0 - 37 U/L 20  16  20    ALT 0 - 35 U/L 17  14  21       Anselm Bring, MD 06/02/2024, 9:06 AM  Cc: Dr. Landry Georgi.

## 2024-06-02 NOTE — Op Note (Signed)
 [facility] Patient Name: Leslie Griffin Procedure Date: 06/02/2024 9:15 AM MRN: [id number] Endoscopist: Lynnie Bring , MD, [id number] Age: 73 Referring MD:  Date of Birth: 05-06-1951 Gender: Female Account #: [id number] Procedure:                Upper GI endoscopy Indications:              Dysphagia with barium swallow showing prominent                            cricopharyngeus bar Medicines:                Monitored Anesthesia Care Procedure:                Pre-Anesthesia Assessment:                           - Prior to the procedure, a History and Physical                            was performed, and patient medications and                            allergies were reviewed. The patient's tolerance of                            previous anesthesia was also reviewed. The risks                            and benefits of the procedure and the sedation                            options and risks were discussed with the patient.                            All questions were answered, and informed consent                            was obtained. Prior Anticoagulants: The patient has                            taken no anticoagulant or antiplatelet agents. ASA                            Grade Assessment: II - A patient with mild systemic                            disease. After reviewing the risks and benefits,                            the patient was deemed in satisfactory condition to                            undergo the procedure.  After obtaining informed consent, the endoscope was                            passed under direct vision. Throughout the                            procedure, the patient's blood pressure, pulse, and                            oxygen saturations were monitored continuously. The                            Olympus scope [id number] was introduced through the                            mouth, and advanced to the second part  of duodenum.                            The upper GI endoscopy was accomplished without                            difficulty. The patient tolerated the procedure                            well. Scope In: Scope Out: Findings:                 The examined esophagus was mildly tortuous but                            normal. We did not appreciate any proximal                            esophageal web. Biopsies were obtained from the                            proximal and distal esophagus with cold forceps for                            histology of suspected eosinophilic esophagitis.                            The scope was withdrawn. Dilation was performed                            with a Maloney dilator with mild resistance at 50                            Fr and 52 Fr.                           The Z-line was regular and was found 35 cm from the  incisors.                           The entire examined stomach was normal.                           The examined duodenum was normal. Complications:            No immediate complications. Estimated Blood Loss:     Estimated blood loss: none. Impression:               - Mild presbyesophagus. Dilated.                           - Z-line regular, 35 cm from the incisors.                           - Normal stomach.                           - Normal examined duodenum.                           - Biopsies were taken with a cold forceps for                            evaluation of eosinophilic esophagitis. Recommendation:           - Patient has a contact number available for                            emergencies. The signs and symptoms of potential                            delayed complications were discussed with the                            patient. Return to normal activities tomorrow.                            Written discharge instructions were provided to the                            patient.                            - Resume previous diet.                           - Continue present medications omeprazole  20 mg                            p.o. twice daily x 2 weeks, then once a day.                           - Await pathology results.                           -  Chew food specially meats and breads well and eat                            slowly.                           - The findings and recommendations were discussed                            with the patient's family. Lynnie Bring, MD 06/02/2024 9:35:54 AM This report has been signed electronically.

## 2024-06-02 NOTE — Patient Instructions (Addendum)
 Take omeprazole  20 mg twice a day for 2 weeks and the once a day   YOU HAD AN ENDOSCOPIC PROCEDURE TODAY AT THE [facility]:   Refer to the procedure report that was given to you for any specific questions about what was found during the examination.  If the procedure report does not answer your questions, please call your gastroenterologist to clarify.  If you requested that your care partner not be given the details of your procedure findings, then the procedure report has been included in a sealed envelope for you to review at your convenience later.  YOU SHOULD EXPECT: Some feelings of bloating in the abdomen. Passage of more gas than usual.  Walking can help get rid of the air that was put into your GI tract during the procedure and reduce the bloating. If you had a lower endoscopy (such as a colonoscopy or flexible sigmoidoscopy) you may notice spotting of blood in your stool or on the toilet paper. If you underwent a bowel prep for your procedure, you may not have a normal bowel movement for a few days.  Please Note:  You might notice some irritation and congestion in your nose or some drainage.  This is from the oxygen used during your procedure.  There is no need for concern and it should clear up in a day or so.  SYMPTOMS TO REPORT IMMEDIATELY:  Following upper endoscopy (EGD)  Vomiting of blood or coffee ground material  New chest pain or pain under the shoulder blades  Painful or persistently difficult swallowing  New shortness of breath  Fever of 100F or higher  Black, tarry-looking stools  For urgent or emergent issues, a gastroenterologist can be reached at any hour by calling [phone]. Do not use MyChart messaging for urgent concerns.    DIET:  We do recommend a small meal at first, but then you may proceed to your regular diet.  Drink plenty of fluids but you should avoid alcoholic beverages for 24 hours.  ACTIVITY:  You should plan to take it easy for the  rest of today and you should NOT DRIVE or use heavy machinery until tomorrow (because of the sedation medicines used during the test).    FOLLOW UP: Our staff will call the number listed on your records the next business day following your procedure.  We will call around 7:15- 8:00 am to check on you and address any questions or concerns that you may have regarding the information given to you following your procedure. If we do not reach you, we will leave a message.     If any biopsies were taken you will be contacted by phone or by letter within the next 1-3 weeks.  Please call us  at [phone] if you have not heard about the biopsies in 3 weeks.    SIGNATURES/CONFIDENTIALITY: You and/or your care partner have signed paperwork which will be entered into your electronic medical record.  These signatures attest to the fact that that the information above on your After Visit Summary has been reviewed and is understood.  Full responsibility of the confidentiality of this discharge information lies with you and/or your care-partner.

## 2024-06-03 ENCOUNTER — Telehealth: Payer: Self-pay

## 2024-06-03 NOTE — Telephone Encounter (Signed)
  Follow up Call-     06/02/2024    8:41 AM  Call back number  Post procedure Call Back phone  # [phone]  Permission to leave phone message Yes     Patient questions:  Do you have a fever, pain , or abdominal swelling? No Pain Score  0   Have you tolerated food without any problems? Yes.    Have you been able to return to your normal activities? YES  Do you have any questions about your discharge instructions: Diet   No. Medications  No. Follow up visit  No.  Do you have questions or concerns about your Care? Yes.   Patient reports she had trouble eating tomato soup yesterday and her nose is congested. Advised patient to take medication for congestion and to monitor. Advised if she is unable to eat through out the day or if she feels her symptoms worsen then to call us  back. Informed patient symptoms should resolve within a day or two. Patient denies any fever and pain less than 4.   Patient verbalizes understanding  Actions: * If pain score is 4 or above: No action needed, pain <4.

## 2024-06-07 ENCOUNTER — Ambulatory Visit: Payer: Self-pay | Admitting: Gastroenterology

## 2024-06-07 LAB — SURGICAL PATHOLOGY

## 2024-06-14 ENCOUNTER — Encounter: Payer: Self-pay | Admitting: Gastroenterology

## 2024-06-15 DIAGNOSIS — Z961 Presence of intraocular lens: Secondary | ICD-10-CM | POA: Diagnosis not present

## 2024-06-15 DIAGNOSIS — H43812 Vitreous degeneration, left eye: Secondary | ICD-10-CM | POA: Diagnosis not present

## 2024-06-15 DIAGNOSIS — H538 Other visual disturbances: Secondary | ICD-10-CM | POA: Diagnosis not present

## 2024-06-15 DIAGNOSIS — H40023 Open angle with borderline findings, high risk, bilateral: Secondary | ICD-10-CM | POA: Diagnosis not present

## 2024-06-16 NOTE — Telephone Encounter (Signed)
 Barium swallow-initially showed prominent cricopharyngeus muscle (one of the muscles in the upper esophagus) EGD with dilatation was performed.  Esophageal biopsies showed mild reflux without eosinophilic esophagitis Please continue PPIs twice a day  If still with problems, need to see a ear nose throat doctor (ENT) for further eval.  If still with problems, will perform further testing  RG

## 2024-06-17 NOTE — Telephone Encounter (Signed)
 Pt made aware of Dr. Charlanne recommendations. Referral and records was sent to [facility]. Pt made aware.  Pt verbalized understanding with all questions answered.

## 2024-06-28 DIAGNOSIS — R09A2 Foreign body sensation, throat: Secondary | ICD-10-CM | POA: Diagnosis not present

## 2024-06-28 DIAGNOSIS — K219 Gastro-esophageal reflux disease without esophagitis: Secondary | ICD-10-CM | POA: Diagnosis not present

## 2024-08-04 ENCOUNTER — Other Ambulatory Visit: Payer: Self-pay | Admitting: Nurse Practitioner

## 2024-08-04 ENCOUNTER — Encounter: Payer: Self-pay | Admitting: Nurse Practitioner

## 2024-08-04 ENCOUNTER — Ambulatory Visit: Payer: Self-pay | Admitting: Nurse Practitioner

## 2024-08-04 ENCOUNTER — Ambulatory Visit (INDEPENDENT_AMBULATORY_CARE_PROVIDER_SITE_OTHER): Admitting: Nurse Practitioner

## 2024-08-04 VITALS — BP 128/84 | HR 56 | Temp 97.7°F | Ht 61.0 in | Wt 165.2 lb

## 2024-08-04 DIAGNOSIS — K219 Gastro-esophageal reflux disease without esophagitis: Secondary | ICD-10-CM | POA: Diagnosis not present

## 2024-08-04 DIAGNOSIS — E559 Vitamin D deficiency, unspecified: Secondary | ICD-10-CM

## 2024-08-04 DIAGNOSIS — R7303 Prediabetes: Secondary | ICD-10-CM | POA: Diagnosis not present

## 2024-08-04 DIAGNOSIS — I1A0 Resistant hypertension: Secondary | ICD-10-CM | POA: Diagnosis not present

## 2024-08-04 DIAGNOSIS — E538 Deficiency of other specified B group vitamins: Secondary | ICD-10-CM

## 2024-08-04 DIAGNOSIS — E785 Hyperlipidemia, unspecified: Secondary | ICD-10-CM

## 2024-08-04 LAB — CBC
HCT: 38.6 % (ref 36.0–46.0)
Hemoglobin: 12.8 g/dL (ref 12.0–15.0)
MCHC: 33.2 g/dL (ref 30.0–36.0)
MCV: 88 fl (ref 78.0–100.0)
Platelets: 189 K/uL (ref 150.0–400.0)
RBC: 4.38 Mil/uL (ref 3.87–5.11)
RDW: 18.3 % — ABNORMAL HIGH (ref 11.5–15.5)
WBC: 4.2 K/uL (ref 4.0–10.5)

## 2024-08-04 LAB — COMPREHENSIVE METABOLIC PANEL WITH GFR
ALT: 22 U/L (ref 0–35)
AST: 19 U/L (ref 0–37)
Albumin: 4.3 g/dL (ref 3.5–5.2)
Alkaline Phosphatase: 60 U/L (ref 39–117)
BUN: 16 mg/dL (ref 6–23)
CO2: 29 meq/L (ref 19–32)
Calcium: 9.4 mg/dL (ref 8.4–10.5)
Chloride: 103 meq/L (ref 96–112)
Creatinine, Ser: 0.59 mg/dL (ref 0.40–1.20)
GFR: 89.87 mL/min (ref >60.00)
Glucose, Bld: 92 mg/dL (ref 70–99)
Potassium: 3.9 meq/L (ref 3.5–5.1)
Sodium: 140 meq/L (ref 135–145)
Total Bilirubin: 0.5 mg/dL (ref 0.2–1.2)
Total Protein: 7 g/dL (ref 6.0–8.3)

## 2024-08-04 LAB — LIPID PANEL
Cholesterol: 158 mg/dL (ref 0–200)
HDL: 70.7 mg/dL (ref >39.00)
LDL Cholesterol: 71 mg/dL (ref 0–99)
NonHDL: 87.54
Total CHOL/HDL Ratio: 2
Triglycerides: 84 mg/dL (ref 0.0–149.0)
VLDL: 16.8 mg/dL (ref 0.0–40.0)

## 2024-08-04 LAB — VITAMIN D 25 HYDROXY (VIT D DEFICIENCY, FRACTURES): VITD: 25.08 ng/mL — ABNORMAL LOW (ref 30.00–100.00)

## 2024-08-04 LAB — HEMOGLOBIN A1C: Hgb A1c MFr Bld: 5.7 % (ref 4.6–6.5)

## 2024-08-04 LAB — VITAMIN B12: Vitamin B-12: 152 pg/mL — ABNORMAL LOW (ref 211–911)

## 2024-08-04 MED ORDER — PANTOPRAZOLE SODIUM 20 MG PO TBEC: 20.0000 mg | DELAYED_RELEASE_TABLET | Freq: Two times a day (BID) | ORAL | 0 refills | Status: DC

## 2024-08-04 MED ORDER — VITAMIN D (ERGOCALCIFEROL) 1.25 MG (50000 UNIT) PO CAPS
50000.0000 [IU] | ORAL_CAPSULE | ORAL | 0 refills | Status: AC
Start: 2024-08-04 — End: ?

## 2024-08-04 NOTE — Progress Notes (Signed)
 Established Patient Office Visit  Subjective   Patient ID: Leslie Griffin, female    DOB: February 12, 1951  Age: 73 y.o. MRN: [id number]  Chief Complaint  Patient presents with   Gastroesophageal Reflux    Discussed the use of AI scribe software for clinical note transcription with the patient, who gave verbal consent to proceed.  History of Present Illness Leslie Griffin is a 73 year old female with GERD and hypertension   Chest discomfort and dysphagia - Persistent chest discomfort described as a pressure sensation, occurring with meals - Symptoms not alleviated by omeprazole  20 mg twice daily - Endoscopy and swallow test revealed esophageal lining abnormalities without evidence of malignancy - Esophageal dilation during endoscopy did not relieve symptoms -Has already undergone cardiac evaluation, discomfort always triggered by oral intake not related to physical exertion  Hypertension and associated symptoms - Hypertension managed with carvedilol  12.5 mg twice daily - Recent blood pressure measured at 128/84 mmHg  Hyperlipidemia - On rosuvastatin  10 mg daily for cholesterol management - Cholesterol levels have not been checked recently  Vitamin deficiencies - History of vitamin D  and B12 deficiencies - Not currently taking vitamin D  supplements - On vitamin B12 100 mcg daily, but has not taken it recently and is unsure of current B12 levels      ROS: see HPI    Objective:     BP 128/84   Pulse (!) 56   Temp 97.7 F (36.5 C) (Temporal)   Ht 5' 1 (1.549 m)   Wt 165 lb 4 oz (75 kg)   SpO2 97%   BMI 31.22 kg/m  BP Readings from Last 3 Encounters:  08/04/24 128/84  06/02/24 131/81  04/05/24 98/70   Wt Readings from Last 3 Encounters:  08/04/24 165 lb 4 oz (75 kg)  06/02/24 172 lb (78 kg)  04/05/24 172 lb 8 oz (78.2 kg)      Physical Exam Vitals reviewed.  Constitutional:      General: She is not in acute distress.    Appearance: Normal appearance.  HENT:      Head: Normocephalic and atraumatic.  Neck:     Vascular: No carotid bruit.  Cardiovascular:     Rate and Rhythm: Normal rate and regular rhythm.     Pulses: Normal pulses.     Heart sounds: Murmur heard.  Pulmonary:     Effort: Pulmonary effort is normal.     Breath sounds: Normal breath sounds.  Skin:    General: Skin is warm and dry.  Neurological:     General: No focal deficit present.     Mental Status: She is alert and oriented to person, place, and time.  Psychiatric:        Mood and Affect: Mood normal.        Behavior: Behavior normal.        Judgment: Judgment normal.      No results found for any visits on 08/04/24.    The 10-year ASCVD risk score (Arnett DK, et al., 2019) is: 14.5%    Assessment & Plan:   Problem List Items Addressed This Visit       Cardiovascular and Mediastinum   Hypertension - Primary   Primary hypertension with history of hypertrophic cardiomyopathy Blood pressure controlled at 128/84 mmHg on carvedilol .  - Continue carvedilol  12.5 mg twice daily. - Avoid diuretics due to history of hypertrophic cardiomyopathy.      Relevant Orders   Comprehensive metabolic panel with GFR  Lipid panel   Vitamin B12   VITAMIN D  25 Hydroxy (Vit-D Deficiency, Fractures)   Hemoglobin A1c   CBC     Digestive   GERD (gastroesophageal reflux disease)   Gastroesophageal reflux disease with esophageal dysmotility  GERD with esophageal dysmotility causing chest pain and discomfort. Weight decreased from 172 lbs to 165 lbs. Omeprazole  ineffective. Endoscopy and ENT evaluation showed irritation and swelling, no malignancy. Prefers minimal medication use, concerned about costs. - Switch to pantoprazole  20 mg twice daily, 30 minutes before meals. - Encourage follow-up with Dr. Charlanne, gastroenterologist. - Monitor for improvement in symptoms  -Encourage regular food intake      Relevant Medications   pantoprazole  (PROTONIX ) 20 MG tablet   Other  Relevant Orders   Comprehensive metabolic panel with GFR   Lipid panel   Vitamin B12   VITAMIN D  25 Hydroxy (Vit-D Deficiency, Fractures)   Hemoglobin A1c   CBC     Other   B12 deficiency   History of vitamin B12 deficiency Not taking vitamin B12 supplements. - Check serum vitamin B12 level.      Relevant Orders   Comprehensive metabolic panel with GFR   Lipid panel   Vitamin B12   VITAMIN D  25 Hydroxy (Vit-D Deficiency, Fractures)   Hemoglobin A1c   CBC   HLD (hyperlipidemia)   Hyperlipidemia Cholesterol levels not recently checked. On rosuvastatin  10 mg daily. - Check lipid panel. - Continue rosuvastatin  10 mg daily pending results.      Relevant Orders   Comprehensive metabolic panel with GFR   Lipid panel   Vitamin B12   VITAMIN D  25 Hydroxy (Vit-D Deficiency, Fractures)   Hemoglobin A1c   CBC   Prediabetes   Labs ordered, further recommendations may be made based upon his results       Relevant Orders   Comprehensive metabolic panel with GFR   Lipid panel   Vitamin B12   VITAMIN D  25 Hydroxy (Vit-D Deficiency, Fractures)   Hemoglobin A1c   CBC   Vitamin D  deficiency   History of vitamin D  deficiency Not taking vitamin D  supplements. - Check serum vitamin D  level.      Relevant Orders   Comprehensive metabolic panel with GFR   Lipid panel   Vitamin B12   VITAMIN D  25 Hydroxy (Vit-D Deficiency, Fractures)   Hemoglobin A1c   CBC  Assessment and Plan Assessment & Plan Gastroesophageal reflux disease with esophageal dysmotility  GERD with esophageal dysmotility causing chest pain and discomfort. Weight decreased from 172 lbs to 165 lbs. Omeprazole  ineffective. Endoscopy and ENT evaluation showed irritation and swelling, no malignancy. Prefers minimal medication use, concerned about costs. - Switch to pantoprazole  20 mg twice daily, 30 minutes before meals. - Encourage follow-up with Dr. Charlanne, gastroenterologist. - Monitor for improvement in symptoms   -Encourage regular food intake  Primary hypertension with history of hypertrophic cardiomyopathy Blood pressure controlled at 128/84 mmHg on carvedilol .  - Continue carvedilol  12.5 mg twice daily. - Avoid diuretics due to history of hypertrophic cardiomyopathy.  Hyperlipidemia Cholesterol levels not recently checked. On rosuvastatin  10 mg daily. - Check lipid panel. - Continue rosuvastatin  10 mg daily pending results.  History of vitamin B12 deficiency Not taking vitamin B12 supplements. - Check serum vitamin B12 level.  History of vitamin D  deficiency Not taking vitamin D  supplements. - Check serum vitamin D  level.  Immunizations Due for flu shot. No history of allergic reactions to flu vaccines. - Administer high dose flu shot. -  Provide vaccine information sheet.    Return in about 3 months (around 11/03/2024) for F/U with Lauraine.    Lauraine FORBES Pereyra, NP

## 2024-08-04 NOTE — Assessment & Plan Note (Signed)
 Hyperlipidemia Cholesterol levels not recently checked. On rosuvastatin  10 mg daily. - Check lipid panel. - Continue rosuvastatin  10 mg daily pending results.

## 2024-08-04 NOTE — Assessment & Plan Note (Signed)
 Gastroesophageal reflux disease with esophageal dysmotility  GERD with esophageal dysmotility causing chest pain and discomfort. Weight decreased from 172 lbs to 165 lbs. Omeprazole  ineffective. Endoscopy and ENT evaluation showed irritation and swelling, no malignancy. Prefers minimal medication use, concerned about costs. - Switch to pantoprazole  20 mg twice daily, 30 minutes before meals. - Encourage follow-up with Dr. Charlanne, gastroenterologist. - Monitor for improvement in symptoms  -Encourage regular food intake

## 2024-08-04 NOTE — Assessment & Plan Note (Signed)
 Primary hypertension with history of hypertrophic cardiomyopathy Blood pressure controlled at 128/84 mmHg on carvedilol .  - Continue carvedilol  12.5 mg twice daily. - Avoid diuretics due to history of hypertrophic cardiomyopathy.

## 2024-08-04 NOTE — Assessment & Plan Note (Signed)
 History of vitamin D  deficiency Not taking vitamin D  supplements. - Check serum vitamin D  level.

## 2024-08-04 NOTE — Assessment & Plan Note (Signed)
 Labs ordered, further recommendations may be made based upon his results.

## 2024-08-04 NOTE — Assessment & Plan Note (Signed)
 History of vitamin B12 deficiency Not taking vitamin B12 supplements. - Check serum vitamin B12 level.

## 2024-08-11 ENCOUNTER — Encounter

## 2024-08-11 DIAGNOSIS — H1132 Conjunctival hemorrhage, left eye: Secondary | ICD-10-CM | POA: Diagnosis not present

## 2024-08-11 DIAGNOSIS — H40023 Open angle with borderline findings, high risk, bilateral: Secondary | ICD-10-CM | POA: Diagnosis not present

## 2024-08-11 DIAGNOSIS — H43812 Vitreous degeneration, left eye: Secondary | ICD-10-CM | POA: Diagnosis not present

## 2024-08-11 DIAGNOSIS — H538 Other visual disturbances: Secondary | ICD-10-CM | POA: Diagnosis not present

## 2024-08-11 DIAGNOSIS — Z961 Presence of intraocular lens: Secondary | ICD-10-CM | POA: Diagnosis not present

## 2024-08-16 ENCOUNTER — Encounter: Payer: Medicare HMO | Attending: Physical Medicine & Rehabilitation | Admitting: Physical Medicine & Rehabilitation

## 2024-08-20 ENCOUNTER — Other Ambulatory Visit: Payer: Self-pay | Admitting: Cardiology

## 2024-08-20 DIAGNOSIS — I119 Hypertensive heart disease without heart failure: Secondary | ICD-10-CM

## 2024-08-30 ENCOUNTER — Other Ambulatory Visit: Payer: Self-pay | Admitting: Nurse Practitioner

## 2024-08-30 DIAGNOSIS — M797 Fibromyalgia: Secondary | ICD-10-CM

## 2024-08-30 MED ORDER — METHOCARBAMOL 500 MG PO TABS: 500.0000 mg | ORAL_TABLET | Freq: Three times a day (TID) | ORAL | 0 refills | Status: AC | PRN

## 2024-09-20 ENCOUNTER — Other Ambulatory Visit: Payer: Self-pay | Admitting: Cardiology

## 2024-09-20 DIAGNOSIS — H538 Other visual disturbances: Secondary | ICD-10-CM | POA: Diagnosis not present

## 2024-09-20 DIAGNOSIS — H53413 Scotoma involving central area, bilateral: Secondary | ICD-10-CM | POA: Diagnosis not present

## 2024-09-20 DIAGNOSIS — I119 Hypertensive heart disease without heart failure: Secondary | ICD-10-CM

## 2024-09-22 ENCOUNTER — Encounter: Payer: Self-pay | Admitting: Nurse Practitioner

## 2024-10-05 ENCOUNTER — Other Ambulatory Visit: Payer: Self-pay | Admitting: Cardiology

## 2024-10-05 DIAGNOSIS — I119 Hypertensive heart disease without heart failure: Secondary | ICD-10-CM

## 2024-10-06 ENCOUNTER — Other Ambulatory Visit: Payer: Self-pay | Admitting: Nurse Practitioner

## 2024-10-06 DIAGNOSIS — E785 Hyperlipidemia, unspecified: Secondary | ICD-10-CM

## 2024-10-07 ENCOUNTER — Encounter: Payer: Self-pay | Admitting: Oncology

## 2024-10-15 ENCOUNTER — Other Ambulatory Visit: Payer: Self-pay | Admitting: Cardiology

## 2024-10-15 DIAGNOSIS — I119 Hypertensive heart disease without heart failure: Secondary | ICD-10-CM

## 2024-10-22 ENCOUNTER — Other Ambulatory Visit: Payer: Self-pay | Admitting: Nurse Practitioner

## 2024-10-22 DIAGNOSIS — K219 Gastro-esophageal reflux disease without esophagitis: Secondary | ICD-10-CM

## 2024-10-24 ENCOUNTER — Other Ambulatory Visit: Payer: Self-pay

## 2024-10-24 ENCOUNTER — Encounter: Payer: Self-pay | Admitting: Orthopedic Surgery

## 2024-10-24 ENCOUNTER — Ambulatory Visit: Admitting: Orthopedic Surgery

## 2024-10-24 DIAGNOSIS — M545 Low back pain, unspecified: Secondary | ICD-10-CM | POA: Diagnosis not present

## 2024-10-24 DIAGNOSIS — R200 Anesthesia of skin: Secondary | ICD-10-CM

## 2024-10-24 DIAGNOSIS — M79605 Pain in left leg: Secondary | ICD-10-CM | POA: Diagnosis not present

## 2024-10-24 MED ORDER — PREDNISONE 10 MG PO TABS: 10.0000 mg | ORAL_TABLET | Freq: Every day | ORAL | 0 refills | Status: AC

## 2024-10-24 NOTE — Telephone Encounter (Signed)
 Can we get her scheduled for follow-up appointment?

## 2024-10-24 NOTE — Progress Notes (Signed)
 Office Visit Note   Patient: Leslie Griffin           Date of Birth: 05-10-1951           MRN: [id number] Visit Date: 10/24/2024              Requested by: Elnor Lauraine BRAVO, NP [address] [place],  [place] [address] PCP: Elnor Lauraine BRAVO, NP  Chief Complaint  Patient presents with   Left Foot - Numbness      HPI: Discussed the use of AI scribe software for clinical note transcription with the patient, who gave verbal consent to proceed.  History of Present Illness Tarsha Blando is a 73 year old female who presents with swelling, pain, and numbness in her foot.  She experiences swelling and pain that begins in her foot and extends to her ankle. The swelling is less severe than in the past, but she notes episodes where it has been significantly worse. The numbness now involves her entire foot, whereas it was previously confined to her toes. She describes a sensation of something moving in her foot when pressure is applied. Previous x-rays of her foot were normal.  She has a history of arthritis in her neck and reports increasing lower back pain. She finds it difficult to stand straight in the mornings and often feels hunched over. The back pain has been worsening over time, affecting her ability to walk, which was the reason for her previous ankle surgery. She currently uses a cane to assist with walking due to the back pain.  Her big toe sometimes bends abnormally, which she finds unusual. She wears wide shoes to accommodate this issue.  She has had previous cortisone shots in her back several years ago, which were ineffective.     Assessment & Plan: Visit Diagnoses:  1. Numbness of left foot     Plan: Assessment and Plan Assessment & Plan Lumbar spondylolisthesis with degenerative disc disease and associated radicular symptoms Chronic L4-5 spondylolisthesis with degenerative disc disease and radicular numbness. Symptoms worsening with increased back pain and difficulty  standing. Lumbar spine radiographs confirm grade one spondylolisthesis. Differential includes cervical spine pathology, but lumbar focus due to symptomatology. Previous cortisone injections ineffective. - Prescribed oral prednisone  10 mg daily with breakfast. - Scheduled follow-up in four weeks to assess prednisone  response. - If no improvement, order lumbar spine MRI. - If MRI indicates, refer to Dr. Eldonna for cortisone injection. - If cortisone injection ineffective, consider referral to spine surgeon.      Follow-Up Instructions: No follow-ups on file.   Ortho Exam  Patient is alert, oriented, no adenopathy, well-dressed, normal affect, normal respiratory effort. Physical Exam EXTREMITIES: No pain with active or passive range of motion of the ankle joint. MUSCULOSKELETAL: Negative sciatic straight leg raise.   No focal motor weakness in her lower extremities.   Imaging: No results found. No images are attached to the encounter.  Labs: Lab Results  Component Value Date   HGBA1C 5.7 08/04/2024   HGBA1C 5.8 08/22/2021   ESRSEDRATE 11 06/10/2023   CRP 0.6 06/10/2023   CRP <1.0 03/06/2023     Lab Results  Component Value Date   ALBUMIN 4.3 08/04/2024   ALBUMIN 4.4 04/01/2024   ALBUMIN 4.4 06/10/2023    No results found for: MG Lab Results  Component Value Date   VD25OH 25.08 (L) 08/04/2024    No results found for: PREALBUMIN    Latest Ref Rng & Units 08/04/2024  10:06 AM 06/10/2023   11:25 AM 03/06/2023    8:41 AM  CBC EXTENDED  WBC 4.0 - 10.5 K/uL 4.2  3.9  5.2   RBC 3.87 - 5.11 Mil/uL 4.38  4.38  4.59   Hemoglobin 12.0 - 15.0 g/dL 87.1  87.3  86.2   HCT 36.0 - 46.0 % 38.6  38.6  40.6   Platelets 150.0 - 400.0 K/uL 189.0  210  252.0   NEUT# 1.7 - 7.7 K/uL  2.1    Lymph# 0.7 - 4.0 K/uL  1.4       There is no height or weight on file to calculate BMI.  Orders:  Orders Placed This Encounter  Procedures   XR Foot Complete Left   XR Lumbar Spine  2-3 Views   Meds ordered this encounter  Medications   predniSONE  (DELTASONE ) 10 MG tablet    Sig: Take 1 tablet (10 mg total) by mouth daily with breakfast.    Dispense:  30 tablet    Refill:  0     Procedures: No procedures performed  Clinical Data: No additional findings.  ROS:  All other systems negative, except as noted in the HPI. Review of Systems  Objective: Vital Signs: There were no vitals taken for this visit.  Specialty Comments:  No specialty comments available.  PMFS History: Patient Active Problem List   Diagnosis Date Noted   Vitamin D  deficiency 08/04/2024   Globus sensation 01/01/2024   Acute maxillary sinusitis 11/20/2023   Weight loss 11/20/2023   Screening for cardiovascular condition 03/20/2023   Hyperhidrosis 03/06/2023   Fibromyalgia 10/28/2022   Osteoarthritis 10/28/2022   Constipation 09/04/2022   Need for vaccination 09/04/2022   Primary osteoarthritis of left shoulder 05/13/2022   Prediabetes 05/01/2022   Murmur 04/17/2022   Posterior tibial tendinitis, left leg    HLD (hyperlipidemia) 08/22/2021   Visual snow syndrome 12/30/2020   Arthropathy of lumbar facet joint 11/12/2020   Chest pain 06/14/2020   Dissociative episodes 06/14/2020   Laceration of right index finger without foreign body without damage to nail 04/03/2020   Multiple drug allergies 04/03/2020   B12 deficiency 03/13/2020   Generalized abdominal pain 03/13/2020   Low serum iron 10/03/2019   Low vitamin D  level 10/03/2019   Ocular migraine 10/03/2019   Familial hypercholesterolemia 06/27/2019   Upper GI bleed 06/27/2019   Right foot pain 03/27/2019   Generalized pain 10/08/2018   Hypertension 08/18/2018   Diastolic dysfunction, left ventricle 08/18/2018   Family history of macular degeneration 08/18/2018   GERD (gastroesophageal reflux disease) 08/18/2018   History of cataract surgery 08/18/2018   History of migraine 08/18/2018   Hypertensive cardiomyopathy,  without heart failure (HCC) 08/18/2018   Medication refill 08/18/2018   Mixed dyslipidemia 08/18/2018   Obesity, Class II, BMI 35-39.9 08/18/2018   Wellness examination 08/18/2018   Cardiomyopathy (HCC) 02/03/2018   Pain and swelling of ankle, right 01/27/2018   Left-sided low back pain without sciatica 12/04/2017   Fatigue 12/04/2017   Anxiety 11/04/2017   Arthralgia 11/04/2017   History of attempted suicide 11/04/2017   Insomnia 11/04/2017   Moderate episode of recurrent major depressive disorder (HCC) 11/04/2017   Past Medical History:  Diagnosis Date   Allergic rhinitis    Anxiety    Arthritis    Cataract    Depression    Family history of colonic polyps    Fibromyalgia    GERD (gastroesophageal reflux disease)    Heart murmur    History  of colonic polyps    Hypercholesterolemia    Hypertension    Hypertrophic cardiomyopathy (HCC)    Mitral valve prolapse    Ocular migraine    Palpitations    Sleep apnea     Family History  Problem Relation Age of Onset   Heart disease Mother    Heart disease Father    Colon polyps Sister    Other Sister        low hemoglobin   Coronary artery disease Other    Colon cancer Neg Hx    Esophageal cancer Neg Hx    Rectal cancer Neg Hx    Stomach cancer Neg Hx    Pancreatic cancer Neg Hx     Past Surgical History:  Procedure Laterality Date   ABDOMINAL HYSTERECTOMY     age was in her 30s   COLONOSCOPY  10/05/2017   Mild sigmoid diverticulosis. Otherwise normal colonoscopy.    cyst removal arm Left 1981   from cat scratch fever   EYE SURGERY     bilateral cataract removal   FOOT ARTHRODESIS Left 11/01/2021   Procedure: LEFT TALONAVICULAR AND SUBTALAR FUSION;  Surgeon: Harden Jerona GAILS, MD;  Location: [facility] OR;  Service: Orthopedics;  Laterality: Left;   Social History   Occupational History   Occupation: Retired  Tobacco Use   Smoking status: Never   Smokeless tobacco: Never  Vaping Use   Vaping status: Never Used   Substance and Sexual Activity   Alcohol  use: Yes    Comment: ocassionally    Drug use: No   Sexual activity: Not on file

## 2024-10-25 ENCOUNTER — Ambulatory Visit

## 2024-10-25 VITALS — BP 132/72 | HR 84 | Ht 64.0 in | Wt 172.4 lb

## 2024-10-25 DIAGNOSIS — Z0000 Encounter for general adult medical examination without abnormal findings: Secondary | ICD-10-CM | POA: Diagnosis not present

## 2024-10-25 DIAGNOSIS — Z1159 Encounter for screening for other viral diseases: Secondary | ICD-10-CM | POA: Diagnosis not present

## 2024-10-25 NOTE — Patient Instructions (Addendum)
 Leslie Griffin,  Thank you for taking the time for your Medicare Wellness Visit. I appreciate your continued commitment to your health goals. Please review the care plan we discussed, and feel free to reach out if I can assist you further.  Please note that Annual Wellness Visits do not include a physical exam. Some assessments may be limited, especially if the visit was conducted virtually. If needed, we may recommend an in-person follow-up with your provider.  Ongoing Care Seeing your primary care provider every 3 to 6 months helps us  monitor your health and provide consistent, personalized care.   Referrals If a referral was made during today's visit and you haven't received any updates within two weeks, please contact the referred provider directly to check on the status.  Recommended Screenings:  Health Maintenance  Topic Date Due   Hepatitis C Screening  Never done   Zoster (Shingles) Vaccine (1 of 2) Never done   Flu Shot  06/17/2024   COVID-19 Vaccine (3 - 2025-26 season) 07/18/2024   Breast Cancer Screening  09/07/2025   Medicare Annual Wellness Visit  10/25/2025   Colon Cancer Screening  10/25/2026   DTaP/Tdap/Td vaccine (3 - Td or Tdap) 04/03/2030   Pneumococcal Vaccine for age over 73  Completed   Osteoporosis screening with Bone Density Scan  Completed   Meningitis B Vaccine  Aged Out       07/03/2023   11:15 AM  Advanced Directives  Does Patient Have a Medical Advance Directive? Yes  Type of Advance Directive Living will  Copy of Healthcare Power of Attorney in Chart? No - copy requested    Vision: Annual vision screenings are recommended for early detection of glaucoma, cataracts, and diabetic retinopathy. These exams can also reveal signs of chronic conditions such as diabetes and high blood pressure.  Dental: Annual dental screenings help detect early signs of oral cancer, gum disease, and other conditions linked to overall health, including heart disease and  diabetes.

## 2024-10-25 NOTE — Progress Notes (Signed)
 Chief Complaint  Patient presents with   Medicare Wellness     Subjective:  Please attest and cosign this visit due to patients primary care provider not being in the office at the time the visit was completed.  (Pt of Leslie Pereyra, NP)   Leslie Griffin is a 73 y.o. female who presents for a Medicare Annual Wellness Visit.  Visit info / Clinical Intake: Medicare Wellness Visit Type:: Subsequent Annual Wellness Visit Persons participating in visit and providing information:: patient Medicare Wellness Visit Mode:: In-person (required for WTM) Interpreter Needed?: No Pre-visit prep was completed: yes AWV questionnaire completed by patient prior to visit?: yes Date:: 10/25/24 Living arrangements:: lives with spouse/significant other Patient's Overall Health Status Rating: good Typical amount of pain: (!) a lot (7 - rate) Does pain affect daily life?: (!) yes Are you currently prescribed opioids?: no  Dietary Habits and Nutritional Risks How many meals a day?: 2 Eats fruit and vegetables daily?: yes Most meals are obtained by: preparing own meals In the last 2 weeks, have you had any of the following?: none Diabetic:: no  Functional Status Activities of Daily Living (to include ambulation/medication): Independent Ambulation: Independent with device- listed below Home Assistive Devices/Equipment: Eyeglasses; Cane Medication Administration: Independent Home Management (perform basic housework or laundry): Needs assistance (comment) (Spouse helps) Manage your own finances?: yes Primary transportation is: driving Concerns about vision?: no *vision screening is required for WTM* Concerns about hearing?: no  Fall Screening Falls in the past year?: 0 Number of falls in past year: 0 Was there an injury with Fall?: 0 Fall Risk Category Calculator: 0 Patient Fall Risk Level: Low Fall Risk  Fall Risk Patient at Risk for Falls Due to: No Fall Risks Fall risk Follow up: Falls  evaluation completed; Falls prevention discussed  Home and Transportation Safety: All rugs have non-skid backing?: yes All stairs or steps have railings?: yes Grab bars in the bathtub or shower?: yes Have non-skid surface in bathtub or shower?: yes Good home lighting?: yes Regular seat belt use?: yes Hospital stays in the last year:: no  Cognitive Assessment Difficulty concentrating, remembering, or making decisions? : no Will 6CIT or Mini Cog be Completed: yes What year is it?: 0 points What month is it?: 0 points Give patient an address phrase to remember (5 components): [address] [place] About what time is it?: 0 points Count backwards from 20 to 1: 0 points Say the months of the year in reverse: 0 points Repeat the address phrase from earlier: 0 points 6 CIT Score: 0 points  Advance Directives (For Healthcare) Does Patient Have a Medical Advance Directive?: No Would patient like information on creating a medical advance directive?: No - Patient declined  Reviewed/Updated  Reviewed/Updated: Reviewed All (Medical, Surgical, Family, Medications, Allergies, Care Teams, Patient Goals)    Allergies (verified) Benadryl [diphenhydramine hcl], Cephalexin, Duloxetine, Penicillins, Topiramate, and Sulfa antibiotics   Current Medications (verified) Outpatient Encounter Medications as of 10/25/2024  Medication Sig   carvedilol  (COREG ) 12.5 MG tablet TAKE 1 TABLET BY MOUTH TWICE A DAY   cyanocobalamin  1000 MCG tablet Take 1 tablet by mouth daily.   methocarbamol  (ROBAXIN ) 500 MG tablet Take 1 tablet (500 mg total) by mouth every 8 (eight) hours as needed for muscle spasms.   pantoprazole  (PROTONIX ) 40 MG tablet TAKE 1 TABLET BY MOUTH EVERY DAY   predniSONE  (DELTASONE ) 10 MG tablet Take 1 tablet (10 mg total) by mouth daily with breakfast.   rosuvastatin  (CRESTOR ) 10  MG tablet TAKE 1 TABLET BY MOUTH EVERY DAY   Vitamin D , Ergocalciferol , (DRISDOL ) 1.25 MG (50000 UNIT) CAPS  capsule Take 1 capsule (50,000 Units total) by mouth every 7 (seven) days.   No facility-administered encounter medications on file as of 10/25/2024.    History: Past Medical History:  Diagnosis Date   Allergic rhinitis    Anxiety    Arthritis    Cataract    Depression    Family history of colonic polyps    Fibromyalgia    GERD (gastroesophageal reflux disease)    Heart murmur    History of colonic polyps    Hypercholesterolemia    Hypertension    Hypertrophic cardiomyopathy (HCC)    Mitral valve prolapse    Ocular migraine    Palpitations    Sleep apnea    Past Surgical History:  Procedure Laterality Date   ABDOMINAL HYSTERECTOMY     age was in her 30s   COLONOSCOPY  10/05/2017   Mild sigmoid diverticulosis. Otherwise normal colonoscopy.    cyst removal arm Left 1981   from cat scratch fever   EYE SURGERY     bilateral cataract removal   FOOT ARTHRODESIS Left 11/01/2021   Procedure: LEFT TALONAVICULAR AND SUBTALAR FUSION;  Surgeon: Harden Jerona GAILS, MD;  Location: [facility] OR;  Service: Orthopedics;  Laterality: Left;   Family History  Problem Relation Age of Onset   Heart disease Mother    Heart disease Father    Colon polyps Sister    Other Sister        low hemoglobin   Coronary artery disease Other    Colon cancer Neg Hx    Esophageal cancer Neg Hx    Rectal cancer Neg Hx    Stomach cancer Neg Hx    Pancreatic cancer Neg Hx    Social History   Occupational History   Occupation: Retired  Tobacco Use   Smoking status: Never   Smokeless tobacco: Never  Vaping Use   Vaping status: Never Used  Substance and Sexual Activity   Alcohol  use: Yes    Alcohol /week: 2.0 standard drinks of alcohol     Types: 1 Cans of beer, 1 Shots of liquor per week    Comment: ocassionally    Drug use: No   Sexual activity: Yes   Tobacco Counseling Counseling given: Not Answered  SDOH Screenings   Food Insecurity: No Food Insecurity (10/25/2024)  Housing: Unknown (10/25/2024)   Transportation Needs: No Transportation Needs (10/25/2024)  Utilities: Not At Risk (10/25/2024)  Alcohol  Screen: Low Risk  (11/17/2023)  Depression (PHQ2-9): Low Risk  (10/25/2024)  Financial Resource Strain: Low Risk  (10/25/2024)  Physical Activity: Inactive (10/25/2024)  Social Connections: Socially Integrated (10/25/2024)  Stress: Stress Concern Present (10/25/2024)  Tobacco Use: Low Risk  (10/25/2024)  Health Literacy: Adequate Health Literacy (10/25/2024)   See flowsheets for full screening details  Depression Screen PHQ 2 & 9 Depression Scale- Over the past 2 weeks, how often have you been bothered by any of the following problems? Little interest or pleasure in doing things: 0 Feeling down, depressed, or hopeless (PHQ Adolescent also includes...irritable): 0 PHQ-2 Total Score: 0 Trouble falling or staying asleep, or sleeping too much: 1 (due to back pain) Feeling tired or having little energy: 0 Poor appetite or overeating (PHQ Adolescent also includes...weight loss): 0 Feeling bad about yourself - or that you are a failure or have let yourself or your family down: 0 Trouble concentrating on things, such as  reading the newspaper or watching television [facility] Adolescent also includes...like school work): 0 Moving or speaking so slowly that other people could have noticed. Or the opposite - being so fidgety or restless that you have been moving around a lot more than usual: 1 (back pain) Thoughts that you would be better off dead, or of hurting yourself in some way: 0 PHQ-9 Total Score: 2 If you checked off any problems, how difficult have these problems made it for you to do your work, take care of things at home, or get along with other people?: Somewhat difficult  Depression Treatment Depression Interventions/Treatment : EYV7-0 Score <4 Follow-up Not Indicated; Patient refuses Treatment     Goals Addressed               This Visit's Progress     Patient Stated (pt-stated)         Patient stated she plans to continue managing pain (back)             Objective:    Today's Vitals   10/25/24 1118  BP: 132/72  Pulse: 84  SpO2: 97%  Weight: 172 lb 6.4 oz (78.2 kg)  Height: 5' 4 (1.626 m)   Body mass index is 29.59 kg/m.  Hearing/Vision screen Hearing Screening - Comments:: Denies hearing difficulties   Vision Screening - Comments:: Wears rx glasses - up to date with routine eye exams with [facility] Immunizations and Health Maintenance Health Maintenance  Topic Date Due   Hepatitis C Screening  Never done   Zoster Vaccines- Shingrix (1 of 2) Never done   Influenza Vaccine  06/17/2024   COVID-19 Vaccine (3 - 2025-26 season) 07/18/2024   Mammogram  09/07/2025   Medicare Annual Wellness (AWV)  10/25/2025   Colonoscopy  10/25/2026   DTaP/Tdap/Td (3 - Td or Tdap) 04/03/2030   Pneumococcal Vaccine: 50+ Years  Completed   Bone Density Scan  Completed   Meningococcal B Vaccine  Aged Out        Assessment/Plan:  This is a routine wellness examination for [place].  Hepatitis C Screening: Ordered today  Patient Care Team: Elnor Leslie BRAVO, NP as PCP - General (Nurse Practitioner) Kate Lonni CROME, MD as PCP - Cardiology (Cardiology) [facility] (Ophthalmology)  I have personally reviewed and noted the following in the patient's chart:   Medical and social history Use of alcohol , tobacco or illicit drugs  Current medications and supplements including opioid prescriptions. Functional ability and status Nutritional status Physical activity Advanced directives List of other physicians Hospitalizations, surgeries, and ER visits in previous 12 months Vitals Screenings to include cognitive, depression, and falls Referrals and appointments  Orders Placed This Encounter  Procedures   Hepatitis C antibody    Standing Status:   Future    Expiration Date:   10/25/2025   In addition, I have reviewed  and discussed with patient certain preventive protocols, quality metrics, and best practice recommendations. A written personalized care plan for preventive services as well as general preventive health recommendations were provided to patient.   Verdie CHRISTELLA Saba, CMA   10/25/2024   Return in 1 year (on 10/25/2025).  After Visit Summary: (In Person-Declined) Patient declined AVS at this time.  Nurse Notes: Appt w/PCP on 11/03/24; scheduled 2026 AWV/CPE appts

## 2024-10-29 ENCOUNTER — Encounter: Payer: Self-pay | Admitting: Orthopedic Surgery

## 2024-10-31 ENCOUNTER — Telehealth: Payer: Self-pay

## 2024-10-31 NOTE — Telephone Encounter (Signed)
 Called patient and left message to call office to schedule appointment to see provider.

## 2024-10-31 NOTE — Telephone Encounter (Signed)
 I called and sw the pt to advise of Dr. Crist suggestion. The pt states that she thinks this is just related to acid reflux and that she over reacted I advised that she should still seek medical work up and the pt states that she will call her cardiologist that she was due to reach out to them anyway she said that she took her prednisone  today and that the reaction was much less than what it had been over the weekend. I advised the pt that Dr. Harden said again to hold the prednisone  and reach out to emergency services for cardiac work up. The pt said that she will.

## 2024-11-03 ENCOUNTER — Telehealth: Payer: Self-pay | Admitting: Nurse Practitioner

## 2024-11-03 ENCOUNTER — Ambulatory Visit: Admitting: Nurse Practitioner

## 2024-11-03 VITALS — BP 134/78 | HR 58 | Temp 97.6°F | Ht 64.0 in | Wt 168.0 lb

## 2024-11-03 DIAGNOSIS — M2550 Pain in unspecified joint: Secondary | ICD-10-CM | POA: Diagnosis not present

## 2024-11-03 DIAGNOSIS — Z1159 Encounter for screening for other viral diseases: Secondary | ICD-10-CM | POA: Diagnosis not present

## 2024-11-03 DIAGNOSIS — E559 Vitamin D deficiency, unspecified: Secondary | ICD-10-CM | POA: Diagnosis not present

## 2024-11-03 DIAGNOSIS — E538 Deficiency of other specified B group vitamins: Secondary | ICD-10-CM | POA: Diagnosis not present

## 2024-11-03 LAB — COMPREHENSIVE METABOLIC PANEL WITH GFR
ALT: 20 U/L (ref 3–35)
AST: 15 U/L (ref 5–37)
Albumin: 4.5 g/dL (ref 3.5–5.2)
Alkaline Phosphatase: 61 U/L (ref 39–117)
BUN: 18 mg/dL (ref 6–23)
CO2: 29 meq/L (ref 19–32)
Calcium: 9.5 mg/dL (ref 8.4–10.5)
Chloride: 102 meq/L (ref 96–112)
Creatinine, Ser: 0.64 mg/dL (ref 0.40–1.20)
GFR: 87.97 mL/min (ref >60.00)
Glucose, Bld: 100 mg/dL — ABNORMAL HIGH (ref 70–99)
Potassium: 3.8 meq/L (ref 3.5–5.1)
Sodium: 141 meq/L (ref 135–145)
Total Bilirubin: 0.5 mg/dL (ref 0.2–1.2)
Total Protein: 7.3 g/dL (ref 6.0–8.3)

## 2024-11-03 LAB — BASIC METABOLIC PANEL WITH GFR
BUN: 18 mg/dL (ref 6–23)
CO2: 29 meq/L (ref 19–32)
Calcium: 9.5 mg/dL (ref 8.4–10.5)
Chloride: 102 meq/L (ref 96–112)
Creatinine, Ser: 0.64 mg/dL (ref 0.40–1.20)
GFR: 87.97 mL/min (ref >60.00)
Glucose, Bld: 100 mg/dL — ABNORMAL HIGH (ref 70–99)
Potassium: 3.8 meq/L (ref 3.5–5.1)
Sodium: 141 meq/L (ref 135–145)

## 2024-11-03 LAB — VITAMIN B12: Vitamin B-12: >1500 pg/mL — ABNORMAL HIGH (ref 211–911)

## 2024-11-03 LAB — VITAMIN D 25 HYDROXY (VIT D DEFICIENCY, FRACTURES): VITD: 29.09 ng/mL — ABNORMAL LOW (ref 30.00–100.00)

## 2024-11-03 NOTE — Telephone Encounter (Signed)
 Can we try to reach out to scheduling for Dr. Crist office (ortho) to reschedule her upcoming appointment with him. She has MRI that day and will need to reschedule.

## 2024-11-03 NOTE — Progress Notes (Signed)
 Established Patient Office Visit  Subjective   Patient ID: Leslie Griffin, female    DOB: 21-Dec-1950  Age: 73 y.o. MRN: [id number]  Chief Complaint  Patient presents with   Vitamin Deficiencies    Discussed the use of AI scribe software for clinical note transcription with the patient, who gave verbal consent to proceed.  History of Present Illness Leslie Griffin is a 73 year old female with vitamin D  and B12 deficiency who presents for follow-up on her vitamin levels and ongoing visual disturbances.  Vitamin deficiencies and supplementation - Vitamin B12 deficiency, last serum level 152. Currently being treated with 1000 mcg daily oral supplementation  - Completed ergocalciferol  supplementation with weekly 50,000IU.   Visual disturbances - 10-year history of visual disturbances characterized by flashing lights and a 'kaleidoscope effect' with variable intensity - Recent evaluation at [facility] revealed optic nerve dysfunction - Brain MRI scheduled in January for further evaluation - Family history of macular degeneration, multiple sclerosis, optic neuritis - Intermittent centralized pain in the back of the head, distinct from prior optic migraines, without clear triggers      ROS: see HPI    Objective:     BP 134/78   Pulse (!) 58   Temp 97.6 F (36.4 C) (Temporal)   Ht 5' 4 (1.626 m)   Wt 168 lb (76.2 kg)   SpO2 97%   BMI 28.84 kg/m  BP Readings from Last 3 Encounters:  11/03/24 134/78  10/25/24 132/72  08/04/24 128/84   Wt Readings from Last 3 Encounters:  11/03/24 168 lb (76.2 kg)  10/25/24 172 lb 6.4 oz (78.2 kg)  08/04/24 165 lb 4 oz (75 kg)      Physical Exam Vitals reviewed.  Constitutional:      General: She is not in acute distress.    Appearance: Normal appearance.  HENT:     Head: Normocephalic and atraumatic.  Neck:     Vascular: No carotid bruit.  Cardiovascular:     Rate and Rhythm: Normal rate and regular rhythm.     Pulses:  Normal pulses.     Heart sounds: Murmur heard.  Pulmonary:     Effort: Pulmonary effort is normal.     Breath sounds: Normal breath sounds.  Skin:    General: Skin is warm and dry.  Neurological:     General: No focal deficit present.     Mental Status: She is alert and oriented to person, place, and time.  Psychiatric:        Mood and Affect: Mood normal.        Behavior: Behavior normal.        Judgment: Judgment normal.      No results found for any visits on 11/03/24.    The 10-year ASCVD risk score (Arnett DK, et al., 2019) is: 15.7%    Assessment & Plan:   Problem List Items Addressed This Visit       Other   B12 deficiency   Relevant Orders   VITAMIN D  25 Hydroxy (Vit-D Deficiency, Fractures)   Comprehensive metabolic panel with GFR   Vitamin B12   Rheumatoid factor   Anti-smooth muscle antibody, IgG   Anti-DNA antibody, double-stranded   Cyclic citrul peptide antibody, IgG   Basic metabolic panel with GFR   Arthralgia   Relevant Orders   Rheumatoid factor   Anti-smooth muscle antibody, IgG   Anti-DNA antibody, double-stranded   Cyclic citrul peptide antibody, IgG   Basic metabolic panel with GFR  Vitamin D  deficiency - Primary   Relevant Orders   VITAMIN D  25 Hydroxy (Vit-D Deficiency, Fractures)   Comprehensive metabolic panel with GFR   Vitamin B12   Rheumatoid factor   Anti-smooth muscle antibody, IgG   Anti-DNA antibody, double-stranded   Cyclic citrul peptide antibody, IgG   Basic metabolic panel with GFR   Other Visit Diagnoses       Encounter for hepatitis C screening test for low risk patient          Assessment and Plan Assessment & Plan Vitamin D  deficiency - Ordered vitamin D  level. Further recommendations to be made based on results.   Vitamin B12 deficiency Taking 1000 mcg daily  - Ordered vitamin B12 level.  Arthralgia and optic nerve dysfunction - Check labs for possible underlying autoimmune disease - Patient to  follow-up with eye specialist at [facility]      Return in about 3 months (around 02/01/2025) for F/U with Akul Leggette.    Leslie FORBES Pereyra, NP

## 2024-11-03 NOTE — Assessment & Plan Note (Signed)
 Labs ordered, further recommendations may be made based upon his results.

## 2024-11-03 NOTE — Assessment & Plan Note (Signed)
 Vitamin B12 deficiency Taking 1000 mcg daily  - Ordered vitamin B12 level.

## 2024-11-03 NOTE — Telephone Encounter (Signed)
 Patient has been trying to get an appointment with Dr. Lonni Nanas (cardiology) but has been unable to get in contact with the office and has been unable to make appointment on Mychart. Can we call over there or contact scheduling over there in Epic to try and help facilitate her getting on his schedule.

## 2024-11-03 NOTE — Assessment & Plan Note (Signed)
 Vitamin D  deficiency - Ordered vitamin D  level. Further recommendations to be made based on results.

## 2024-11-03 NOTE — Telephone Encounter (Signed)
 Called Dr. Crist office and spoke to Madilyn who stated she will be calling pt to re- schedule them for their appointment.

## 2024-11-04 ENCOUNTER — Ambulatory Visit: Payer: Self-pay | Admitting: Nurse Practitioner

## 2024-11-05 LAB — HEPATITIS C ANTIBODY: Hepatitis C Ab: NONREACTIVE

## 2024-11-07 ENCOUNTER — Ambulatory Visit: Payer: Self-pay | Admitting: Nurse Practitioner

## 2024-11-10 LAB — ANTI-SMOOTH MUSCLE ANTIBODY, IGG: Actin (Smooth Muscle) Antibody (IGG): <20 U

## 2024-11-10 LAB — CYCLIC CITRUL PEPTIDE ANTIBODY, IGG: Cyclic Citrullin Peptide Ab: <16 U

## 2024-11-10 LAB — RHEUMATOID FACTOR: Rheumatoid fact SerPl-aCnc: <10 [IU]/mL

## 2024-11-10 LAB — ANTI-DNA ANTIBODY, DOUBLE-STRANDED: ds DNA Ab: 1 [IU]/mL

## 2024-11-11 ENCOUNTER — Encounter: Payer: Self-pay | Admitting: *Deleted

## 2024-11-11 NOTE — Progress Notes (Signed)
 Leslie Griffin                                          MRN: [id number]   11/11/2024   The [facility] Quality Team Specialist reviewed this patient medical record for the purposes of chart review for care gap closure. The following were reviewed: abstraction for care gap closure-controlling blood pressure.    [facility] Quality Team

## 2024-11-14 NOTE — Telephone Encounter (Signed)
 Called patient and appt scheduled with provider 1/2. Made patient aware to call office for any questions. Pt verbalized an understanding

## 2024-11-17 NOTE — Progress Notes (Unsigned)
 " Cardiology Office Note:    Date:  11/18/2024   ID:  Leslie Griffin, DOB [birth year], MRN [id number]  PCP:  Elnor Leslie BRAVO, NP  Cardiologist:  Lonni LITTIE Nanas, MD  Electrophysiologist:  None   Referring MD: Elnor Leslie BRAVO, NP   No chief complaint on file.   History of Present Illness:    Leslie Griffin is a 74 y.o. female with a hx of HCM, hypertension, hyperlipidemia, fibromyalgia, MVP who presents for follow-up.  She was referred by Leslie Elnor, NP for evaluation of chest pain, initially seen on 04/28/2022.  She was seen in the ED for chest pain on 04/11/2022, work-up unremarkable.  She reports she has been having chest pain off and on since 5/26.  Feels sharp pain in the left center chest, lasts for about 30 minutes or so.  Occurs within 5 minutes of eating.  However has also noted chest pain when exerting herself.  Does report having shortness of breath.  Has also had episodes of lightheadedness but no syncope.  Reports has been having palpitations occurring during episodes as well.  No smoking history.  Family history includes both parents died from MI around age 56, brother had CABG in 31s.  Echocardiogram 05/05/2022 showed severe asymmetric hypertrophy of the septum, peak gradient across LVOT with Valsalva 20 mmHg, normal biventricular function, mild AI/MR.  Zio patch x7 days on 05/15/2022 showed no significant arrhythmias.  Coronary CTA 05/21/2022 showed calcium  score 1 (40th percentile), minimal nonobstructive plaque in LAD.  Cardiac MRI 07/23/2022 showed asymmetric LV hypertrophy measuring 16 mm in basal septum (10 mm in posterior wall), meeting criteria for hypertrophic dermopathy, no LGE, normal biventricular size and systolic function, mild mitral regurgitation (regurgitant fraction 11%).  Since last clinic visit, she reports she is doing okay.  Does report she has been having chest pain with eating.  Denies any exertional chest pain.  Reports palpitation about once per week, can feel like heart  is skipping beats or pounding.  Denies any shortness of breath.  Continues to have lower extremity edema.  Has not been exercising due to back pain.   Wt Readings from Last 3 Encounters:  11/18/24 170 lb 3.2 oz (77.2 kg)  11/03/24 168 lb (76.2 kg)  10/25/24 172 lb 6.4 oz (78.2 kg)      Past Medical History:  Diagnosis Date   Allergic rhinitis    Anxiety    Arthritis    Cataract    Depression    Family history of colonic polyps    Fibromyalgia    GERD (gastroesophageal reflux disease)    Heart murmur    History of colonic polyps    Hypercholesterolemia    Hypertension    Hypertrophic cardiomyopathy (HCC)    Mitral valve prolapse    Ocular migraine    Palpitations    Sleep apnea     Past Surgical History:  Procedure Laterality Date   ABDOMINAL HYSTERECTOMY     age was in her 30s   COLONOSCOPY  10/05/2017   Mild sigmoid diverticulosis. Otherwise normal colonoscopy.    cyst removal arm Left 1981   from cat scratch fever   EYE SURGERY     bilateral cataract removal   FOOT ARTHRODESIS Left 11/01/2021   Procedure: LEFT TALONAVICULAR AND SUBTALAR FUSION;  Surgeon: Harden Jerona GAILS, MD;  Location: [facility] OR;  Service: Orthopedics;  Laterality: Left;    Current Medications: Current Meds  Medication Sig   carvedilol  (COREG ) 12.5 MG tablet TAKE 1  TABLET BY MOUTH TWICE A DAY   Cholecalciferol 25 MCG (1000 UT) capsule Take 1,000 Units by mouth daily.   cyanocobalamin  1000 MCG tablet Take 1 tablet by mouth daily.   methocarbamol  (ROBAXIN ) 500 MG tablet Take 1 tablet (500 mg total) by mouth every 8 (eight) hours as needed for muscle spasms.   pantoprazole  (PROTONIX ) 40 MG tablet TAKE 1 TABLET BY MOUTH EVERY DAY   predniSONE  (DELTASONE ) 10 MG tablet Take 1 tablet (10 mg total) by mouth daily with breakfast.   rosuvastatin  (CRESTOR ) 10 MG tablet TAKE 1 TABLET BY MOUTH EVERY DAY     Allergies:   Benadryl [diphenhydramine hcl], Cephalexin, Duloxetine, Penicillins, Topiramate, and  Sulfa antibiotics   Social History   Socioeconomic History   Marital status: Married    Spouse name: Reyes   Number of children: 1   Years of education: Not on file   Highest education level: Associate degree: occupational, scientist, product/process development, or vocational program  Occupational History   Occupation: Retired  Tobacco Use   Smoking status: Never   Smokeless tobacco: Never  Vaping Use   Vaping status: Never Used  Substance and Sexual Activity   Alcohol  use: Yes    Alcohol /week: 2.0 standard drinks of alcohol     Types: 1 Cans of beer, 1 Shots of liquor per week    Comment: ocassionally    Drug use: No   Sexual activity: Yes  Other Topics Concern   Not on file  Social History Narrative   Married   Social Drivers of Health   Tobacco Use: Low Risk (11/18/2024)   Patient History    Smoking Tobacco Use: Never    Smokeless Tobacco Use: Never    Passive Exposure: Not on file  Financial Resource Strain: Low Risk (10/25/2024)   Overall Financial Resource Strain (CARDIA)    Difficulty of Paying Living Expenses: Not hard at all  Food Insecurity: No Food Insecurity (10/25/2024)   Epic    Worried About Radiation Protection Practitioner of Food in the Last Year: Never true    Ran Out of Food in the Last Year: Never true  Transportation Needs: No Transportation Needs (10/25/2024)   Epic    Lack of Transportation (Medical): No    Lack of Transportation (Non-Medical): No  Physical Activity: Inactive (10/25/2024)   Exercise Vital Sign    Days of Exercise per Week: 0 days    Minutes of Exercise per Session: 0 min  Stress: Stress Concern Present (10/25/2024)   [employer] of Occupational Health - Occupational Stress Questionnaire    Feeling of Stress: Rather much  Social Connections: Socially Integrated (10/25/2024)   Social Connection and Isolation Panel    Frequency of Communication with Friends and Family: More than three times a week    Frequency of Social Gatherings with Friends and Family: Once a week     Attends Religious Services: More than 4 times per year    Active Member of Clubs or Organizations: Yes    Attends Banker Meetings: More than 4 times per year    Marital Status: Married  Depression (PHQ2-9): High Risk (11/03/2024)   Depression (PHQ2-9)    PHQ-2 Score: 11  Alcohol  Screen: Low Risk (11/17/2023)   Alcohol  Screen    Last Alcohol  Screening Score (AUDIT): 1  Housing: Unknown (10/25/2024)   Epic    Unable to Pay for Housing in the Last Year: No    Number of Times Moved in the Last Year: Not on file  Homeless in the Last Year: No  Utilities: Not At Risk (10/25/2024)   Epic    Threatened with loss of utilities: No  Health Literacy: Adequate Health Literacy (10/25/2024)   B1300 Health Literacy    Frequency of need for help with medical instructions: Never     Family History: The patient's family history includes Colon polyps in her sister; Coronary artery disease in an other family member; Heart disease in her father and mother; Other in her sister. There is no history of Colon cancer, Esophageal cancer, Rectal cancer, Stomach cancer, or Pancreatic cancer.  ROS:   Please see the history of present illness.     All other systems reviewed and are negative.  EKGs/Labs/Other Studies Reviewed:    The following studies were reviewed today:   EKG:   04/28/2022: Normal sinus rhythm, 89, LVH, Q waves in leads III, aVF, less than 1 mm ST depressions in leads V5/6 07/29/23: NSR, rate 62, no ST abnormalities 11/18/24: Sinus bradycardia, rate 55, LVH   Recent Labs: 08/04/2024: Hemoglobin 12.8; Platelets 189.0 11/03/2024: ALT 20; BUN 18; BUN 18; Creatinine, Ser 0.64; Creatinine, Ser 0.64; Potassium 3.8; Potassium 3.8; Sodium 141; Sodium 141  Recent Lipid Panel    Component Value Date/Time   CHOL 158 08/04/2024 1006   CHOL 185 07/29/2022 1007   TRIG 84.0 08/04/2024 1006   HDL 70.70 08/04/2024 1006   HDL 92 07/29/2022 1007   CHOLHDL 2 08/04/2024 1006   VLDL 16.8  08/04/2024 1006   LDLCALC 71 08/04/2024 1006   LDLCALC 78 07/29/2022 1007    Physical Exam:    VS:  BP 132/88 (BP Location: Left Arm, Patient Position: Sitting)   Pulse (!) 55   Ht 5' 1 (1.549 m)   Wt 170 lb 3.2 oz (77.2 kg)   SpO2 98%   BMI 32.16 kg/m     Wt Readings from Last 3 Encounters:  11/18/24 170 lb 3.2 oz (77.2 kg)  11/03/24 168 lb (76.2 kg)  10/25/24 172 lb 6.4 oz (78.2 kg)     GEN:  in no acute distress HEENT: Normal NECK: No JVD; No carotid bruits CARDIAC: RRR, 2 out of 6 systolic murmur RESPIRATORY:  Clear to auscultation without rales, wheezing or rhonchi  ABDOMEN: Soft, non-tender, non-distended MUSCULOSKELETAL: Trace edema; No deformity  SKIN: Warm and dry NEUROLOGIC:  Alert and oriented x 3 PSYCHIATRIC:  Normal affect   ASSESSMENT:    1. Hypertrophic cardiomyopathy (HCC)   2. Palpitations   3. OSA (obstructive sleep apnea)   4. Chest pain of uncertain etiology   5. Essential hypertension   6. Hyperlipidemia, unspecified hyperlipidemia type      PLAN:    Hypertrophic cardiomyopathy: Echocardiogram 05/05/2022 showed severe asymmetric hypertrophy of the septum, peak gradient across LVOT with Valsalva 20 mmHg, normal biventricular function, mild AI/MR.  Zio patch x7 days on 05/15/2022 showed no significant arrhythmias.  Cardiac MRI 07/23/2022 showed asymmetric LV hypertrophy measuring 16 mm in basal septum (10 mm in posterior wall), meeting criteria for hypertrophic dermopathy, no LGE, normal biventricular size and systolic function, mild mitral regurgitation (regurgitant fraction 11%). -Recommend first-degree relatives be screened.  She has spoken to her son about this -Continue carvedilol  12.5 mg twice daily - Update echocardiogram  Lower extremity edema: Reported bilateral swelling in ankles.  Suspect due to amlodipine  use.  Normal BNP, CMET 01/2023.  Amlodipine  has been discontinued.  Trace edema on exam today  Chest pain: Atypical in description.   Coronary CTA 05/21/2022 showed  calcium  score 1 (40th percentile), minimal nonobstructive plaque in LAD.   -Given pain can also occur after eating, could be related to GERD and recommended starting on PPI to see if improvement.  She is taking Protonix   Palpitations: Zio patch x7 days on 05/15/2022 showed no significant arrhythmias. - Reports worsening palpitations recently, will check Zio patch x 14 days  Hypertension: On carvedilol  12.5mg  BID.  Appears controlled  Hyperlipidemia: LDL 166 on 08/22/2021.  10-year ASCVD risk score 10%.  Started rosuvastatin  10 mg daily.  LDL 71 on 07/2024  OSA: mild OSA on sleep study 08/2023, has declined CPAP  RTC in 6 months  Medication Adjustments/Labs and Tests Ordered: Current medicines are reviewed at length with the patient today.  Concerns regarding medicines are outlined above.  Orders Placed This Encounter  Procedures   LONG TERM MONITOR (3-14 DAYS)   EKG 12-Lead   ECHOCARDIOGRAM COMPLETE   No orders of the defined types were placed in this encounter.   Patient Instructions  Medication Instructions:  Your physician recommends that you continue on your current medications as directed. Please refer to the Current Medication list given to you today.  *If you need a refill on your cardiac medications before your next appointment, please call your pharmacy*  Lab Work: none If you have labs (blood work) drawn today and your tests are completely normal, you will receive your results only by: MyChart Message (if you have MyChart) OR A paper copy in the mail If you have any lab test that is abnormal or we need to change your treatment, we will call you to review the results.  Testing/Procedures: Echo  Your physician has requested that you have an echocardiogram. Echocardiography is a painless test that uses sound waves to create images of your heart. It provides your doctor with information about the size and shape of your heart and how well your  hearts chambers and valves are working. This procedure takes approximately one hour. There are no restrictions for this procedure. Please do NOT wear cologne, perfume, aftershave, or lotions (deodorant is allowed). Please arrive 15 minutes prior to your appointment time.  Please note: We ask at that you not bring children with you during ultrasound (echo/ vascular) testing. Due to room size and safety concerns, children are not allowed in the ultrasound rooms during exams. Our front office staff cannot provide observation of children in our lobby area while testing is being conducted. An adult accompanying a patient to their appointment will only be allowed in the ultrasound room at the discretion of the ultrasound technician under special circumstances. We apologize for any inconvenience.   Follow-Up: At [facility], you and your health needs are our priority.  As part of our continuing mission to provide you with exceptional heart care, our providers are all part of one team.  This team includes your primary Cardiologist (physician) and Advanced Practice Providers or APPs (Physician Assistants and Nurse Practitioners) who all work together to provide you with the care you need, when you need it.  Your next appointment:   6 months  Provider:   Dr. Kate  We recommend signing up for the patient portal called MyChart.  Sign up information is provided on this After Visit Summary.  MyChart is used to connect with patients for Virtual Visits (Telemedicine).  Patients are able to view lab/test results, encounter notes, upcoming appointments, etc.  Non-urgent messages can be sent to your provider as well.   To learn more about  what you can do with MyChart, go to [email or web address].   Other Instructions Zio  ZIO XT- Long Term Monitor Instructions  Your physician has requested you wear a ZIO patch monitor for 14 days.  This is a single patch monitor. [employer] supplies one patch  monitor per enrollment. Additional stickers are not available. Please do not apply patch if you will be having a Nuclear Stress Test,  Echocardiogram, Cardiac CT, MRI, or Chest Xray during the period you would be wearing the  monitor. The patch cannot be worn during these tests. You cannot remove and re-apply the  ZIO XT patch monitor.  Your ZIO patch monitor will be mailed 3 day USPS to your address on file. It may take 3-5 days  to receive your monitor after you have been enrolled.  Once you have received your monitor, please review the enclosed instructions. Your monitor  has already been registered assigning a specific monitor serial # to you.  Billing and Patient Assistance Program Information  We have supplied [employer] with any of your insurance information on file for billing purposes. [employer] offers a sliding scale Patient Assistance Program for patients that do not have  insurance, or whose insurance does not completely cover the cost of the ZIO monitor.  You must apply for the Patient Assistance Program to qualify for this discounted rate.  To apply, please call [employer] at [phone], select option 4, select option 2, ask to apply for  Patient Assistance Program. Meredeth will ask your household income, and how many people  are in your household. They will quote your out-of-pocket cost based on that information.  [employer] will also be able to set up a 60-month, interest-free payment plan if needed.  Applying the monitor   Shave hair from upper left chest.  Hold abrader disc by orange tab. Rub abrader in 40 strokes over the upper left chest as  indicated in your monitor instructions.  Clean area with 4 enclosed alcohol  pads. Let dry.  Apply patch as indicated in monitor instructions. Patch will be placed under collarbone on left  side of chest with arrow pointing upward.  Rub patch adhesive wings for 2 minutes. Remove white label marked 1. Remove the white  label marked 2.  Rub patch adhesive wings for 2 additional minutes.  While looking in a mirror, press and release button in center of patch. A small green light will  flash 3-4 times. This will be your only indicator that the monitor has been turned on.  Do not shower for the first 24 hours. You may shower after the first 24 hours.  Press the button if you feel a symptom. You will hear a small click. Record Date, Time and  Symptom in the Patient Logbook.  When you are ready to remove the patch, follow instructions on the last 2 pages of Patient  Logbook. Stick patch monitor onto the last page of Patient Logbook.  Place Patient Logbook in the blue and white box. Use locking tab on box and tape box closed  securely. The blue and white box has prepaid postage on it. Please place it in the mailbox as  soon as possible. Your physician should have your test results approximately 7 days after the  monitor has been mailed back to [facility].  Call [facility] Customer Care at [phone] if you have questions regarding  your ZIO XT patch monitor. Call them immediately if you see an orange light blinking on your  monitor.  If your monitor  falls off in less than 4 days, contact our Monitor department at [phone].  If your monitor becomes loose or falls off after 4 days call [employer] at [phone] for  suggestions on securing your monitor            Signed, Lonni LITTIE Nanas, MD  11/18/2024 8:38 AM    [facility] "

## 2024-11-18 ENCOUNTER — Encounter: Payer: Self-pay | Admitting: Cardiology

## 2024-11-18 ENCOUNTER — Ambulatory Visit: Attending: Cardiology | Admitting: Cardiology

## 2024-11-18 ENCOUNTER — Ambulatory Visit

## 2024-11-18 VITALS — BP 132/88 | HR 55 | Ht 61.0 in | Wt 170.2 lb

## 2024-11-18 DIAGNOSIS — I10 Essential (primary) hypertension: Secondary | ICD-10-CM

## 2024-11-18 DIAGNOSIS — R079 Chest pain, unspecified: Secondary | ICD-10-CM

## 2024-11-18 DIAGNOSIS — R002 Palpitations: Secondary | ICD-10-CM

## 2024-11-18 DIAGNOSIS — I422 Other hypertrophic cardiomyopathy: Secondary | ICD-10-CM

## 2024-11-18 DIAGNOSIS — G4733 Obstructive sleep apnea (adult) (pediatric): Secondary | ICD-10-CM

## 2024-11-18 DIAGNOSIS — E785 Hyperlipidemia, unspecified: Secondary | ICD-10-CM | POA: Diagnosis not present

## 2024-11-18 NOTE — Patient Instructions (Signed)
 Medication Instructions:  Your physician recommends that you continue on your current medications as directed. Please refer to the Current Medication list given to you today.  *If you need a refill on your cardiac medications before your next appointment, please call your pharmacy*  Lab Work: none If you have labs (blood work) drawn today and your tests are completely normal, you will receive your results only by: MyChart Message (if you have MyChart) OR A paper copy in the mail If you have any lab test that is abnormal or we need to change your treatment, we will call you to review the results.  Testing/Procedures: Echo  Your physician has requested that you have an echocardiogram. Echocardiography is a painless test that uses sound waves to create images of your heart. It provides your doctor with information about the size and shape of your heart and how well your hearts chambers and valves are working. This procedure takes approximately one hour. There are no restrictions for this procedure. Please do NOT wear cologne, perfume, aftershave, or lotions (deodorant is allowed). Please arrive 15 minutes prior to your appointment time.  Please note: We ask at that you not bring children with you during ultrasound (echo/ vascular) testing. Due to room size and safety concerns, children are not allowed in the ultrasound rooms during exams. Our front office staff cannot provide observation of children in our lobby area while testing is being conducted. An adult accompanying a patient to their appointment will only be allowed in the ultrasound room at the discretion of the ultrasound technician under special circumstances. We apologize for any inconvenience.   Follow-Up: At [facility], you and your health needs are our priority.  As part of our continuing mission to provide you with exceptional heart care, our providers are all part of one team.  This team includes your primary  Cardiologist (physician) and Advanced Practice Providers or APPs (Physician Assistants and Nurse Practitioners) who all work together to provide you with the care you need, when you need it.  Your next appointment:   6 months  Provider:   Dr. Kate  We recommend signing up for the patient portal called MyChart.  Sign up information is provided on this After Visit Summary.  MyChart is used to connect with patients for Virtual Visits (Telemedicine).  Patients are able to view lab/test results, encounter notes, upcoming appointments, etc.  Non-urgent messages can be sent to your provider as well.   To learn more about what you can do with MyChart, go to [email or web address].   Other Instructions Zio  ZIO XT- Long Term Monitor Instructions  Your physician has requested you wear a ZIO patch monitor for 14 days.  This is a single patch monitor. [employer] supplies one patch monitor per enrollment. Additional stickers are not available. Please do not apply patch if you will be having a Nuclear Stress Test,  Echocardiogram, Cardiac CT, MRI, or Chest Xray during the period you would be wearing the  monitor. The patch cannot be worn during these tests. You cannot remove and re-apply the  ZIO XT patch monitor.  Your ZIO patch monitor will be mailed 3 day USPS to your address on file. It may take 3-5 days  to receive your monitor after you have been enrolled.  Once you have received your monitor, please review the enclosed instructions. Your monitor  has already been registered assigning a specific monitor serial # to you.  Billing and Patient Assistance Program Information  We have  supplied [employer] with any of your insurance information on file for billing purposes. [employer] offers a sliding scale Patient Assistance Program for patients that do not have  insurance, or whose insurance does not completely cover the cost of the ZIO monitor.  You must apply for the Patient Assistance Program to  qualify for this discounted rate.  To apply, please call [employer] at [phone], select option 4, select option 2, ask to apply for  Patient Assistance Program. Meredeth will ask your household income, and how many people  are in your household. They will quote your out-of-pocket cost based on that information.  [employer] will also be able to set up a 61-month, interest-free payment plan if needed.  Applying the monitor   Shave hair from upper left chest.  Hold abrader disc by orange tab. Rub abrader in 40 strokes over the upper left chest as  indicated in your monitor instructions.  Clean area with 4 enclosed alcohol  pads. Let dry.  Apply patch as indicated in monitor instructions. Patch will be placed under collarbone on left  side of chest with arrow pointing upward.  Rub patch adhesive wings for 2 minutes. Remove white label marked 1. Remove the white  label marked 2. Rub patch adhesive wings for 2 additional minutes.  While looking in a mirror, press and release button in center of patch. A small green light will  flash 3-4 times. This will be your only indicator that the monitor has been turned on.  Do not shower for the first 24 hours. You may shower after the first 24 hours.  Press the button if you feel a symptom. You will hear a small click. Record Date, Time and  Symptom in the Patient Logbook.  When you are ready to remove the patch, follow instructions on the last 2 pages of Patient  Logbook. Stick patch monitor onto the last page of Patient Logbook.  Place Patient Logbook in the blue and white box. Use locking tab on box and tape box closed  securely. The blue and white box has prepaid postage on it. Please place it in the mailbox as  soon as possible. Your physician should have your test results approximately 7 days after the  monitor has been mailed back to [facility].  Call [facility] Customer Care at [phone] if you have questions regarding  your ZIO XT  patch monitor. Call them immediately if you see an orange light blinking on your  monitor.  If your monitor falls off in less than 4 days, contact our Monitor department at [phone].  If your monitor becomes loose or falls off after 4 days call [employer] at [phone] for  suggestions on securing your monitor

## 2024-11-18 NOTE — Progress Notes (Unsigned)
 Enrolled patient for a 14 day Zio XT  monitor to be mailed to patients home

## 2024-11-21 ENCOUNTER — Ambulatory Visit: Admitting: Orthopedic Surgery

## 2024-11-22 ENCOUNTER — Encounter: Payer: Self-pay | Admitting: Orthopedic Surgery

## 2024-11-22 ENCOUNTER — Ambulatory Visit: Admitting: Orthopedic Surgery

## 2024-11-22 DIAGNOSIS — M545 Low back pain, unspecified: Secondary | ICD-10-CM

## 2024-11-22 DIAGNOSIS — R200 Anesthesia of skin: Secondary | ICD-10-CM

## 2024-11-22 DIAGNOSIS — M79605 Pain in left leg: Secondary | ICD-10-CM | POA: Diagnosis not present

## 2024-11-22 NOTE — Progress Notes (Signed)
 "  Office Visit Note   Patient: Leslie Griffin           Date of Birth: Nov 14, 1951           MRN: [id number] Visit Date: 11/22/2024              Requested by: Elnor Lauraine BRAVO, NP [address] [place],  [place] [address] PCP: Elnor Lauraine BRAVO, NP  Chief Complaint  Patient presents with   Left Foot - Follow-up      HPI: Discussed the use of AI scribe software for clinical note transcription with the patient, who gave verbal consent to proceed.  History of Present Illness Leslie Griffin is a 74 year old female with lumbar radiculopathy who presents with persistent lower back pain and left lower extremity symptoms.  She describes ongoing pain localized to the lower lumbar region at the waist, which has persisted despite a course of prednisone  that provided only minimal relief. The pain is currently localized to the lower lumbar region at the waist and does not radiate to the thigh or calf.  She continues to experience weakness in the left lower extremity, particularly after ambulation, and reports persistent numbness in the left foot. She ambulates with a cane and was recently able to walk extensively during a holiday trip, though she notes increased left leg weakness with activity.     Assessment & Plan: Visit Diagnoses:  1. Numbness of left foot   2. Low back pain radiating to left leg     Plan: Assessment and Plan Assessment & Plan Lumbar radiculopathy Persistent lumbar pain with left lower extremity weakness and numbness. Minimal improvement with prednisone . Further evaluation needed. - Ordered lumbar spine MRI. - Instructed follow-up post-MRI.      Follow-Up Instructions: Return if symptoms worsen or fail to improve.   Ortho Exam  Patient is alert, oriented, no adenopathy, well-dressed, normal affect, normal respiratory effort. Physical Exam MUSCULOSKELETAL: Mild abductor lurch bilaterally      Imaging: No results found. No images are attached to the  encounter.  Labs: Lab Results  Component Value Date   HGBA1C 5.7 08/04/2024   HGBA1C 5.8 08/22/2021   ESRSEDRATE 11 06/10/2023   CRP 0.6 06/10/2023   CRP <1.0 03/06/2023     Lab Results  Component Value Date   ALBUMIN 4.5 11/03/2024   ALBUMIN 4.3 08/04/2024   ALBUMIN 4.4 04/01/2024    No results found for: MG Lab Results  Component Value Date   VD25OH 29.09 (L) 11/03/2024   VD25OH 25.08 (L) 08/04/2024    No results found for: PREALBUMIN    Latest Ref Rng & Units 08/04/2024   10:06 AM 06/10/2023   11:25 AM 03/06/2023    8:41 AM  CBC EXTENDED  WBC 4.0 - 10.5 K/uL 4.2  3.9  5.2   RBC 3.87 - 5.11 Mil/uL 4.38  4.38  4.59   Hemoglobin 12.0 - 15.0 g/dL 87.1  87.3  86.2   HCT 36.0 - 46.0 % 38.6  38.6  40.6   Platelets 150.0 - 400.0 K/uL 189.0  210  252.0   NEUT# 1.7 - 7.7 K/uL  2.1    Lymph# 0.7 - 4.0 K/uL  1.4       There is no height or weight on file to calculate BMI.  Orders:  No orders of the defined types were placed in this encounter.  No orders of the defined types were placed in this encounter.    Procedures: No procedures  performed  Clinical Data: No additional findings.  ROS:  All other systems negative, except as noted in the HPI. Review of Systems  Objective: Vital Signs: There were no vitals taken for this visit.  Specialty Comments:  No specialty comments available.  PMFS History: Patient Active Problem List   Diagnosis Date Noted   Encounter for hepatitis C screening test for low risk patient 11/03/2024   Vitamin D  deficiency 08/04/2024   Globus sensation 01/01/2024   Acute maxillary sinusitis 11/20/2023   Weight loss 11/20/2023   Screening for cardiovascular condition 03/20/2023   Hyperhidrosis 03/06/2023   Fibromyalgia 10/28/2022   Osteoarthritis 10/28/2022   Constipation 09/04/2022   Need for vaccination 09/04/2022   Primary osteoarthritis of left shoulder 05/13/2022   Prediabetes 05/01/2022   Murmur 04/17/2022    Posterior tibial tendinitis, left leg    HLD (hyperlipidemia) 08/22/2021   Visual snow syndrome 12/30/2020   Arthropathy of lumbar facet joint 11/12/2020   Chest pain 06/14/2020   Dissociative episodes 06/14/2020   Laceration of right index finger without foreign body without damage to nail 04/03/2020   Multiple drug allergies 04/03/2020   B12 deficiency 03/13/2020   Generalized abdominal pain 03/13/2020   Low serum iron 10/03/2019   Low vitamin D  level 10/03/2019   Ocular migraine 10/03/2019   Familial hypercholesterolemia 06/27/2019   Upper GI bleed 06/27/2019   Right foot pain 03/27/2019   Generalized pain 10/08/2018   Hypertension 08/18/2018   Diastolic dysfunction, left ventricle 08/18/2018   Family history of macular degeneration 08/18/2018   GERD (gastroesophageal reflux disease) 08/18/2018   History of cataract surgery 08/18/2018   History of migraine 08/18/2018   Hypertensive cardiomyopathy, without heart failure (HCC) 08/18/2018   Medication refill 08/18/2018   Mixed dyslipidemia 08/18/2018   Obesity, Class II, BMI 35-39.9 08/18/2018   Wellness examination 08/18/2018   Cardiomyopathy (HCC) 02/03/2018   Pain and swelling of ankle, right 01/27/2018   Left-sided low back pain without sciatica 12/04/2017   Fatigue 12/04/2017   Anxiety 11/04/2017   Arthralgia 11/04/2017   History of attempted suicide 11/04/2017   Insomnia 11/04/2017   Moderate episode of recurrent major depressive disorder (HCC) 11/04/2017   Past Medical History:  Diagnosis Date   Allergic rhinitis    Anxiety    Arthritis    Cataract    Depression    Family history of colonic polyps    Fibromyalgia    GERD (gastroesophageal reflux disease)    Heart murmur    History of colonic polyps    Hypercholesterolemia    Hypertension    Hypertrophic cardiomyopathy (HCC)    Mitral valve prolapse    Ocular migraine    Palpitations    Sleep apnea     Family History  Problem Relation Age of Onset    Heart disease Mother    Heart disease Father    Colon polyps Sister    Other Sister        low hemoglobin   Coronary artery disease Other    Colon cancer Neg Hx    Esophageal cancer Neg Hx    Rectal cancer Neg Hx    Stomach cancer Neg Hx    Pancreatic cancer Neg Hx     Past Surgical History:  Procedure Laterality Date   ABDOMINAL HYSTERECTOMY     age was in her 30s   COLONOSCOPY  10/05/2017   Mild sigmoid diverticulosis. Otherwise normal colonoscopy.    cyst removal arm Left 1981   from cat  scratch fever   EYE SURGERY     bilateral cataract removal   FOOT ARTHRODESIS Left 11/01/2021   Procedure: LEFT TALONAVICULAR AND SUBTALAR FUSION;  Surgeon: Harden Jerona GAILS, MD;  Location: [facility] OR;  Service: Orthopedics;  Laterality: Left;   Social History   Occupational History   Occupation: Retired  Tobacco Use   Smoking status: Never   Smokeless tobacco: Never  Vaping Use   Vaping status: Never Used  Substance and Sexual Activity   Alcohol  use: Yes    Alcohol /week: 2.0 standard drinks of alcohol     Types: 1 Cans of beer, 1 Shots of liquor per week    Comment: ocassionally    Drug use: No   Sexual activity: Yes         "

## 2024-12-07 ENCOUNTER — Encounter: Payer: Self-pay | Admitting: Orthopedic Surgery

## 2024-12-11 ENCOUNTER — Other Ambulatory Visit

## 2024-12-13 ENCOUNTER — Ambulatory Visit: Payer: Self-pay | Admitting: Cardiology

## 2024-12-13 DIAGNOSIS — R002 Palpitations: Secondary | ICD-10-CM

## 2024-12-21 ENCOUNTER — Ambulatory Visit
Admission: RE | Admit: 2024-12-21 | Discharge: 2024-12-21 | Disposition: A | Source: Ambulatory Visit | Attending: Orthopedic Surgery | Admitting: Orthopedic Surgery

## 2024-12-21 DIAGNOSIS — M545 Low back pain, unspecified: Secondary | ICD-10-CM

## 2024-12-21 DIAGNOSIS — R200 Anesthesia of skin: Secondary | ICD-10-CM

## 2025-01-02 ENCOUNTER — Ambulatory Visit: Admitting: Orthopedic Surgery

## 2025-02-03 ENCOUNTER — Ambulatory Visit: Admitting: Nurse Practitioner

## 2025-02-15 ENCOUNTER — Ambulatory Visit (HOSPITAL_COMMUNITY)

## 2025-10-27 ENCOUNTER — Encounter: Admitting: Nurse Practitioner

## 2025-10-27 ENCOUNTER — Ambulatory Visit
